# Patient Record
Sex: Female | Born: 1955 | Race: Black or African American | Hispanic: No | Marital: Married | Smoking: Never smoker
Health system: Southern US, Community
[De-identification: ages and names within clinical notes are randomized; demographics above are authoritative.]

## PROBLEM LIST (undated history)

## (undated) DIAGNOSIS — K219 Gastro-esophageal reflux disease without esophagitis: Secondary | ICD-10-CM

## (undated) DIAGNOSIS — G4730 Sleep apnea, unspecified: Secondary | ICD-10-CM

## (undated) DIAGNOSIS — I10 Essential (primary) hypertension: Secondary | ICD-10-CM

## (undated) DIAGNOSIS — E669 Obesity, unspecified: Secondary | ICD-10-CM

## (undated) DIAGNOSIS — R809 Proteinuria, unspecified: Secondary | ICD-10-CM

## (undated) DIAGNOSIS — R7303 Prediabetes: Secondary | ICD-10-CM

## (undated) HISTORY — PX: CHOLECYSTECTOMY: SHX55

## (undated) HISTORY — PX: TONSILLECTOMY: SUR1361

## (undated) HISTORY — PX: BREAST EXCISIONAL BIOPSY: SUR124

## (undated) HISTORY — PX: BREAST SURGERY: SHX581

---

## 1998-01-04 HISTORY — PX: BREAST BIOPSY: SHX20

## 2006-02-08 ENCOUNTER — Ambulatory Visit: Payer: Self-pay | Admitting: Internal Medicine

## 2006-03-02 ENCOUNTER — Ambulatory Visit: Payer: Self-pay | Admitting: Internal Medicine

## 2006-03-08 ENCOUNTER — Ambulatory Visit: Payer: Self-pay | Admitting: Internal Medicine

## 2006-04-07 ENCOUNTER — Ambulatory Visit: Payer: Self-pay | Admitting: Surgery

## 2006-11-24 ENCOUNTER — Ambulatory Visit: Payer: Self-pay | Admitting: Internal Medicine

## 2007-04-11 ENCOUNTER — Ambulatory Visit: Payer: Self-pay | Admitting: Internal Medicine

## 2007-08-29 ENCOUNTER — Ambulatory Visit (HOSPITAL_COMMUNITY): Admission: RE | Admit: 2007-08-29 | Discharge: 2007-08-29 | Payer: Self-pay | Admitting: Neurology

## 2007-08-29 ENCOUNTER — Ambulatory Visit: Payer: Self-pay | Admitting: Surgery

## 2007-08-29 ENCOUNTER — Encounter (INDEPENDENT_AMBULATORY_CARE_PROVIDER_SITE_OTHER): Payer: Self-pay | Admitting: Neurology

## 2007-10-12 ENCOUNTER — Ambulatory Visit: Payer: Self-pay | Admitting: Internal Medicine

## 2007-10-13 ENCOUNTER — Ambulatory Visit: Payer: Self-pay | Admitting: Internal Medicine

## 2008-01-19 ENCOUNTER — Ambulatory Visit: Payer: Self-pay | Admitting: Gastroenterology

## 2008-06-05 ENCOUNTER — Ambulatory Visit: Payer: Self-pay | Admitting: Internal Medicine

## 2009-06-25 ENCOUNTER — Ambulatory Visit: Payer: Self-pay | Admitting: Internal Medicine

## 2010-07-20 ENCOUNTER — Ambulatory Visit: Payer: Self-pay

## 2011-07-29 ENCOUNTER — Ambulatory Visit: Payer: Self-pay

## 2012-07-31 ENCOUNTER — Ambulatory Visit: Payer: Self-pay | Admitting: Internal Medicine

## 2013-07-23 ENCOUNTER — Ambulatory Visit: Payer: Self-pay

## 2013-08-20 ENCOUNTER — Ambulatory Visit: Payer: Self-pay

## 2014-12-17 DIAGNOSIS — E669 Obesity, unspecified: Secondary | ICD-10-CM | POA: Insufficient documentation

## 2014-12-17 DIAGNOSIS — I10 Essential (primary) hypertension: Secondary | ICD-10-CM | POA: Insufficient documentation

## 2014-12-17 DIAGNOSIS — R809 Proteinuria, unspecified: Secondary | ICD-10-CM | POA: Insufficient documentation

## 2014-12-17 DIAGNOSIS — G4733 Obstructive sleep apnea (adult) (pediatric): Secondary | ICD-10-CM | POA: Insufficient documentation

## 2015-01-30 ENCOUNTER — Encounter: Payer: Self-pay | Admitting: *Deleted

## 2015-01-31 ENCOUNTER — Ambulatory Visit: Payer: 59 | Admitting: Certified Registered Nurse Anesthetist

## 2015-01-31 ENCOUNTER — Ambulatory Visit
Admission: RE | Admit: 2015-01-31 | Discharge: 2015-01-31 | Disposition: A | Discharge status: Home / Self Care | Payer: 59 | Source: Ambulatory Visit | Attending: Gastroenterology | Admitting: Gastroenterology

## 2015-01-31 ENCOUNTER — Encounter
Admission: RE | Disposition: A | Discharge status: Home / Self Care | Payer: Self-pay | Source: Ambulatory Visit | Attending: Gastroenterology

## 2015-01-31 ENCOUNTER — Encounter: Payer: Self-pay | Admitting: *Deleted

## 2015-01-31 DIAGNOSIS — D122 Benign neoplasm of ascending colon: Secondary | ICD-10-CM | POA: Insufficient documentation

## 2015-01-31 DIAGNOSIS — Z880 Allergy status to penicillin: Secondary | ICD-10-CM | POA: Diagnosis not present

## 2015-01-31 DIAGNOSIS — I10 Essential (primary) hypertension: Secondary | ICD-10-CM | POA: Insufficient documentation

## 2015-01-31 DIAGNOSIS — Z7982 Long term (current) use of aspirin: Secondary | ICD-10-CM | POA: Insufficient documentation

## 2015-01-31 DIAGNOSIS — E669 Obesity, unspecified: Secondary | ICD-10-CM | POA: Diagnosis not present

## 2015-01-31 DIAGNOSIS — K640 First degree hemorrhoids: Secondary | ICD-10-CM | POA: Insufficient documentation

## 2015-01-31 DIAGNOSIS — K5730 Diverticulosis of large intestine without perforation or abscess without bleeding: Secondary | ICD-10-CM | POA: Diagnosis not present

## 2015-01-31 DIAGNOSIS — G4730 Sleep apnea, unspecified: Secondary | ICD-10-CM | POA: Diagnosis not present

## 2015-01-31 DIAGNOSIS — Z8371 Family history of colonic polyps: Secondary | ICD-10-CM | POA: Diagnosis not present

## 2015-01-31 DIAGNOSIS — Z79899 Other long term (current) drug therapy: Secondary | ICD-10-CM | POA: Diagnosis not present

## 2015-01-31 DIAGNOSIS — Z6837 Body mass index (BMI) 37.0-37.9, adult: Secondary | ICD-10-CM | POA: Diagnosis not present

## 2015-01-31 DIAGNOSIS — K625 Hemorrhage of anus and rectum: Secondary | ICD-10-CM | POA: Diagnosis not present

## 2015-01-31 HISTORY — PX: COLONOSCOPY WITH PROPOFOL: SHX5780

## 2015-01-31 HISTORY — DX: Obesity, unspecified: E66.9

## 2015-01-31 HISTORY — DX: Essential (primary) hypertension: I10

## 2015-01-31 HISTORY — DX: Sleep apnea, unspecified: G47.30

## 2015-01-31 HISTORY — DX: Proteinuria, unspecified: R80.9

## 2015-01-31 SURGERY — COLONOSCOPY WITH PROPOFOL
Anesthesia: General

## 2015-01-31 MED ORDER — PROPOFOL 500 MG/50ML IV EMUL
INTRAVENOUS | Status: DC | PRN
  Administered 2015-01-31: 140 ug/kg/min via INTRAVENOUS

## 2015-01-31 MED ORDER — PROPOFOL 10 MG/ML IV BOLUS
INTRAVENOUS | Status: DC | PRN
  Administered 2015-01-31: 20 mg via INTRAVENOUS

## 2015-01-31 MED ORDER — SODIUM CHLORIDE 0.9 % IV SOLN
INTRAVENOUS | Status: DC
  Administered 2015-01-31: 1000 mL via INTRAVENOUS

## 2015-01-31 MED ORDER — MIDAZOLAM HCL 2 MG/2ML IJ SOLN
INTRAMUSCULAR | Status: DC | PRN
  Administered 2015-01-31: 1 mg via INTRAVENOUS

## 2015-01-31 MED ORDER — LIDOCAINE HCL (CARDIAC) 20 MG/ML IV SOLN
INTRAVENOUS | Status: DC | PRN
Start: 2015-01-31 — End: 2015-01-31
  Administered 2015-01-31: 60 mg via INTRAVENOUS

## 2015-01-31 MED ORDER — SODIUM CHLORIDE 0.9 % IV SOLN: INTRAVENOUS | Status: DC

## 2015-01-31 NOTE — Anesthesia Procedure Notes (Signed)
Date/Time: 01/31/2015 1:22 PM Performed by: Johnna Acosta Pre-anesthesia Checklist: Patient identified, Suction available, Emergency Drugs available, Patient being monitored and Timeout performed Patient Re-evaluated:Patient Re-evaluated prior to inductionOxygen Delivery Method: Nasal cannula

## 2015-01-31 NOTE — H&P (Signed)
Outpatient short stay form Pre-procedure 01/31/2015 12:59 PM Lollie Sails MD  Primary Physician: Dr Clayborn Bigness  Reason for visit:  Colonoscopy  History of present illness:  Patient is a 60 year old female presenting today for colonoscopy. She is been having some problems with occasional rectal bleeding over the last 6 weeks or so. He does have a family history of colon polyps her primary relative. Last colonoscopy was in 2010 with a finding of diverticulosis and a hyperplastic polyp. She tolerated her prep well. She has held her 81 mg aspirin for about 5 days. She takes no other aspirin or blood thinning products.    Current facility-administered medications:  .  0.9 %  sodium chloride infusion, , Intravenous, Continuous, Lollie Sails, MD .  0.9 %  sodium chloride infusion, , Intravenous, Continuous, Lollie Sails, MD  Prescriptions prior to admission  Medication Sig Dispense Refill Last Dose  . aspirin EC 81 MG tablet Take 81 mg by mouth daily.     . hydrocortisone 2.5 % cream Apply 1 application topically as needed.     Marland Kitchen losartan (COZAAR) 50 MG tablet Take 50 mg by mouth daily.   01/30/2015 at 2130     Allergies  Allergen Reactions  . Penicillins      Past Medical History  Diagnosis Date  . Hypertension   . Sleep apnea   . Obesity   . Protein in urine     Review of systems:      Physical Exam    Heart and lungs: Regular rate and rhythm without rub or gallop, lungs are bilaterally clear.    HEENT: Normocephalic atraumatic eyes are anicteric    Other:     Pertinant exam for procedure: Soft nontender nondistended bowel sounds positive normoactive.    Planned proceedures: Colonoscopy and indicated procedures. I have discussed the risks benefits and complications of procedures to include not limited to bleeding, infection, perforation and the risk of sedation and the patient wishes to proceed.    Lollie Sails, MD Gastroenterology 01/31/2015   12:59 PM

## 2015-01-31 NOTE — Op Note (Signed)
[facility] Gastroenterology Patient Name: Jamie Powers Procedure Date: 01/31/2015 1:21 PM MRN: [id number] Account #: [id number] Date of Birth: March 01, 1955 Admit Type: Outpatient Age: 60 Room: [facility] ENDO ROOM 3 Gender: Female Note Status: Finalized Procedure:         Colonoscopy Indications:       Rectal bleeding, Family history of colonic polyps in a                     first-degree relative Providers:         Lollie Sails, MD Referring MD:      Lavera Guise, MD (Referring MD) Medicines:         Monitored Anesthesia Care Complications:     No immediate complications. Procedure:         Pre-Anesthesia Assessment:                    - ASA Grade Assessment: III - A patient with severe                     systemic disease.                    After obtaining informed consent, the colonoscope was                     passed under direct vision. Throughout the procedure, the                     patient's blood pressure, pulse, and oxygen saturations                     were monitored continuously. The Colonoscope was                     introduced through the anus and advanced to the the cecum,                     identified by appendiceal orifice and ileocecal valve. The                     colonoscopy was performed with moderate difficulty.                     Successful completion of the procedure was aided by                     changing the patient to a supine position. The patient                     tolerated the procedure well. The quality of the bowel                     preparation was fair. Findings:      Multiple small and large-mouthed diverticula were found in the entire       colon.      A 11 mm polyp was found in the ascending colon in the proximal ascending       colon. The polyp was semi-pedunculated. The polyp was removed with a hot       snare. Resection and retrieval were complete.      Two sessile polyps were found in the mid ascending  colon. The polyps       were 3 to 4 mm in size. These polyps were  removed with a cold biopsy       forceps. Resection and retrieval were complete.      Non-bleeding internal hemorrhoids were found during retroflexion. The       hemorrhoids were small and Grade I (internal hemorrhoids that do not       prolapse).      The digital rectal exam was normal. Impression:        - Diverticulosis in the entire examined colon.                    - One 11 mm polyp in the ascending colon in the proximal                     ascending colon. Resected and retrieved.                    - Two 3 to 4 mm polyps in the mid ascending colon.                     Resected and retrieved.                    - Non-bleeding internal hemorrhoids. Recommendation:    - Await pathology results.                    - Telephone GI clinic for pathology results in 1 week. Procedure Code(s): --- Professional ---                    [phone], Colonoscopy, flexible; with removal of tumor(s),                     polyp(s), or other lesion(s) by snare technique                    45380, 37, Colonoscopy, flexible; with biopsy, single or                     multiple Diagnosis Code(s): --- Professional ---                    K64.0, First degree hemorrhoids                    D12.2, Benign neoplasm of ascending colon                    K62.5, Hemorrhage of anus and rectum                    Z83.71, Family history of colonic polyps                    K57.30, Diverticulosis of large intestine without                     perforation or abscess without bleeding CPT copyright 2014 American Medical Association. All rights reserved. The codes documented in this report are preliminary and upon coder review may  be revised to meet current compliance requirements. Lollie Sails, MD 01/31/2015 2:14:17 PM This report has been signed electronically. Number of Addenda: 0 Note Initiated On: 01/31/2015 1:21 PM Scope Withdrawal Time: 0 hours 30  minutes 48 seconds  Total Procedure Duration: 0 hours 43 minutes 12 seconds       [facility]

## 2015-01-31 NOTE — Transfer of Care (Signed)
Immediate Anesthesia Transfer of Care Note  Patient: Jamie Powers  Procedure(s) Performed: Procedure(s): COLONOSCOPY WITH PROPOFOL (N/A)  Patient Location: PACU  Anesthesia Type:General  Level of Consciousness: sedated  Airway & Oxygen Therapy: Patient Spontanous Breathing and Patient connected to nasal cannula oxygen  Post-op Assessment: Report given to RN and Post -op Vital signs reviewed and stable  Post vital signs: Reviewed and stable  Last Vitals:  Filed Vitals:   01/31/15 1254  BP: 133/73  Pulse: 56  Temp: 36.4 C  Resp: 18    Complications: No apparent anesthesia complications

## 2015-01-31 NOTE — Anesthesia Postprocedure Evaluation (Signed)
Anesthesia Post Note  Patient: Jamie Powers  Procedure(s) Performed: Procedure(s) (LRB): COLONOSCOPY WITH PROPOFOL (N/A)  Patient location during evaluation: Endoscopy Anesthesia Type: General Level of consciousness: awake and alert Pain management: pain level controlled Vital Signs Assessment: post-procedure vital signs reviewed and stable Respiratory status: spontaneous breathing, nonlabored ventilation, respiratory function stable and patient connected to nasal cannula oxygen Cardiovascular status: blood pressure returned to baseline and stable Postop Assessment: no signs of nausea or vomiting Anesthetic complications: no    Last Vitals:  Filed Vitals:   01/31/15 1440 01/31/15 1450  BP: 122/69 112/62  Pulse: 57 49  Temp:    Resp: 17 17    Last Pain: There were no vitals filed for this visit.               Precious Haws Lakedra [place]

## 2015-01-31 NOTE — Anesthesia Preprocedure Evaluation (Signed)
Anesthesia Evaluation  Patient identified by MRN, date of birth, ID band Patient awake    Reviewed: Allergy & Precautions, H&P , NPO status , Patient's Chart, lab work & pertinent test results  History of Anesthesia Complications Negative for: history of anesthetic complications  Airway Mallampati: III  TM Distance: >3 FB Neck ROM: limited    Dental  (+) Poor Dentition   Pulmonary neg shortness of breath, sleep apnea and Continuous Positive Airway Pressure Ventilation ,    Pulmonary exam normal breath sounds clear to auscultation       Cardiovascular Exercise Tolerance: Good hypertension, (-) angina(-) Past MI and (-) DOE Normal cardiovascular exam Rhythm:regular Rate:Normal     Neuro/Psych negative neurological ROS  negative psych ROS   GI/Hepatic negative GI ROS, Neg liver ROS,   Endo/Other  negative endocrine ROS  Renal/GU Renal disease  negative genitourinary   Musculoskeletal   Abdominal   Peds  Hematology negative hematology ROS (+)   Anesthesia Other Findings Past Medical History:   Hypertension                                                 Sleep apnea                                                  Obesity                                                      Protein in urine                                            Past Surgical History:   TONSILLECTOMY                                                 CHOLECYSTECTOMY                                               BREAST SURGERY                                               BMI    Body Mass Index   37.74 kg/m 2      Reproductive/Obstetrics negative OB ROS                             Anesthesia Physical Anesthesia Plan  ASA: III  Anesthesia Plan: General   Post-op Pain Management:    Induction:   Airway Management Planned:  Additional Equipment:   Intra-op Plan:   Post-operative Plan:   Informed  Consent: I have reviewed the patients History and Physical, chart, labs and discussed the procedure including the risks, benefits and alternatives for the proposed anesthesia with the patient or authorized representative who has indicated his/her understanding and acceptance.   Dental Advisory Given  Plan Discussed with: Anesthesiologist, CRNA and Surgeon  Anesthesia Plan Comments:         Anesthesia Quick Evaluation

## 2015-02-01 ENCOUNTER — Encounter: Payer: Self-pay | Admitting: Gastroenterology

## 2015-02-04 LAB — SURGICAL PATHOLOGY

## 2015-07-30 ENCOUNTER — Other Ambulatory Visit: Payer: Self-pay | Admitting: Physician Assistant

## 2015-07-30 DIAGNOSIS — Z1231 Encounter for screening mammogram for malignant neoplasm of breast: Secondary | ICD-10-CM

## 2015-08-07 ENCOUNTER — Ambulatory Visit
Admission: RE | Admit: 2015-08-07 | Discharge: 2015-08-07 | Disposition: A | Discharge status: Home / Self Care | Payer: 59 | Source: Ambulatory Visit | Attending: Physician Assistant | Admitting: Physician Assistant

## 2015-08-07 ENCOUNTER — Other Ambulatory Visit: Payer: Self-pay | Admitting: Physician Assistant

## 2015-08-07 DIAGNOSIS — Z1231 Encounter for screening mammogram for malignant neoplasm of breast: Secondary | ICD-10-CM | POA: Diagnosis present

## 2015-08-25 ENCOUNTER — Ambulatory Visit: Payer: 59

## 2015-12-02 ENCOUNTER — Other Ambulatory Visit: Payer: Self-pay | Admitting: Nurse Practitioner

## 2015-12-02 DIAGNOSIS — M545 Low back pain: Secondary | ICD-10-CM

## 2015-12-04 ENCOUNTER — Other Ambulatory Visit: Payer: Self-pay | Admitting: Nurse Practitioner

## 2015-12-04 DIAGNOSIS — N63 Unspecified lump in unspecified breast: Secondary | ICD-10-CM

## 2015-12-12 ENCOUNTER — Ambulatory Visit
Admission: RE | Admit: 2015-12-12 | Discharge: 2015-12-12 | Disposition: A | Discharge status: Home / Self Care | Payer: 59 | Source: Ambulatory Visit | Attending: Nurse Practitioner | Admitting: Nurse Practitioner

## 2015-12-12 ENCOUNTER — Other Ambulatory Visit: Payer: Self-pay | Admitting: Nurse Practitioner

## 2015-12-12 DIAGNOSIS — M25561 Pain in right knee: Secondary | ICD-10-CM | POA: Insufficient documentation

## 2015-12-12 DIAGNOSIS — M545 Low back pain: Secondary | ICD-10-CM | POA: Diagnosis not present

## 2015-12-12 DIAGNOSIS — M5136 Other intervertebral disc degeneration, lumbar region: Secondary | ICD-10-CM | POA: Diagnosis not present

## 2015-12-23 ENCOUNTER — Ambulatory Visit
Admission: RE | Admit: 2015-12-23 | Discharge: 2015-12-23 | Disposition: A | Discharge status: Home / Self Care | Payer: 59 | Source: Ambulatory Visit | Attending: Nurse Practitioner | Admitting: Nurse Practitioner

## 2015-12-23 DIAGNOSIS — N63 Unspecified lump in unspecified breast: Secondary | ICD-10-CM

## 2015-12-23 DIAGNOSIS — N6310 Unspecified lump in the right breast, unspecified quadrant: Secondary | ICD-10-CM | POA: Insufficient documentation

## 2015-12-23 DIAGNOSIS — N644 Mastodynia: Secondary | ICD-10-CM | POA: Diagnosis not present

## 2016-10-13 ENCOUNTER — Other Ambulatory Visit: Payer: Self-pay | Admitting: Family Medicine

## 2016-10-13 ENCOUNTER — Other Ambulatory Visit: Payer: Self-pay | Admitting: Nurse Practitioner

## 2016-10-13 DIAGNOSIS — Z1231 Encounter for screening mammogram for malignant neoplasm of breast: Secondary | ICD-10-CM

## 2016-11-04 ENCOUNTER — Ambulatory Visit
Admission: RE | Admit: 2016-11-04 | Discharge: 2016-11-04 | Disposition: A | Discharge status: Home / Self Care | Payer: PRIVATE HEALTH INSURANCE | Source: Ambulatory Visit | Attending: Family Medicine | Admitting: Family Medicine

## 2016-11-04 DIAGNOSIS — Z1231 Encounter for screening mammogram for malignant neoplasm of breast: Secondary | ICD-10-CM | POA: Diagnosis present

## 2017-01-05 DIAGNOSIS — E782 Mixed hyperlipidemia: Secondary | ICD-10-CM | POA: Insufficient documentation

## 2017-01-06 ENCOUNTER — Telehealth: Payer: Self-pay

## 2017-01-06 ENCOUNTER — Ambulatory Visit: Payer: BLUE CROSS/BLUE SHIELD | Admitting: Nurse Practitioner

## 2017-01-06 ENCOUNTER — Encounter: Payer: Self-pay | Admitting: Nurse Practitioner

## 2017-01-06 VITALS — BP 126/87 | HR 68 | Resp 16 | Ht 59.0 in | Wt 206.4 lb

## 2017-01-06 DIAGNOSIS — H1132 Conjunctival hemorrhage, left eye: Secondary | ICD-10-CM

## 2017-01-06 DIAGNOSIS — I10 Essential (primary) hypertension: Secondary | ICD-10-CM | POA: Diagnosis not present

## 2017-01-06 DIAGNOSIS — L84 Corns and callosities: Secondary | ICD-10-CM

## 2017-01-06 DIAGNOSIS — H109 Unspecified conjunctivitis: Secondary | ICD-10-CM | POA: Diagnosis not present

## 2017-01-06 DIAGNOSIS — M25552 Pain in left hip: Secondary | ICD-10-CM

## 2017-01-06 MED ORDER — CIPROFLOXACIN HCL 0.3 % OP SOLN: 1.0000 [drp] | OPHTHALMIC | 0 refills | Status: DC

## 2017-01-06 NOTE — Progress Notes (Signed)
Patient ID: Jamie Powers, female   DOB: October 01, 1955, 62 y.o.   MRN: [id number]   [facility] [place], [place] [address]  Internal MEDICINE  Office Visit Note  Patient Name: Jamie Powers  [id number]  [id number]  Date of Service: 01/06/2017     Complaints/HPI:  Pt is here for a sick visit.  The patient c/o left eye redness and irritation. Started on Sunday while sleeping. Pain in her left eye woke her up. Has been slightly itchy wth some watering since then. Has some tenderness in left upper eyelid. Hsa not noted any pu or colored drainage. Eyelids not matted together in the mornings. She denies fever, nasal congestion, or headache. Has not been around anyone with pink eye or other eye infections.  The patient is also c/o callus on the outer aspect of her left foot. Painful when she walks. Very hard. Not going away with OTC treatments for callus.  Also continues to have lower back pain which radiates into the left hip. Hip pain can be so severe at times, it hurts to walk. She will, sometimes take tylenol arthritis, which does help some.      Current Medication:  Outpatient Encounter Medications as of 01/06/2017  Medication Sig  . aspirin EC 81 MG tablet Take 81 mg by mouth daily.  . hydrocortisone 2.5 % cream Apply 1 application topically as needed.  Marland Kitchen losartan (COZAAR) 50 MG tablet Take 50 mg by mouth daily.   No facility-administered encounter medications on file as of 01/06/2017.       Medical History: Past Medical History:  Diagnosis Date  . Hypertension   . Obesity   . Protein in urine   . Sleep apnea      Social History   Socioeconomic History  . Marital status: Married    Spouse name: Not on file  . Number of children: Not on file  . Years of education: Not on file  . Highest education level: Not on file  Social Needs  . Financial resource strain: Not on file  . Food insecurity - worry: Not on file  . Food insecurity - inability:  Not on file  . Transportation needs - medical: Not on file  . Transportation needs - non-medical: Not on file  Occupational History  . Not on file  Tobacco Use  . Smoking status: Never Smoker  . Smokeless tobacco: Never Used  Substance and Sexual Activity  . Alcohol use: No  . Drug use: No  . Sexual activity: Not on file  Other Topics Concern  . Not on file  Social History Narrative  . Not on file     Today's Vitals   01/06/17 0856  BP: 126/87  Pulse: 68  Resp: 16  SpO2: 97%  Weight: 206 lb 6.4 oz (93.6 kg)  Height: 4\' 11"  (1.499 m)    Review of Systems  Constitutional: Negative for chills, fever and malaise/fatigue.  HENT: Negative for congestion, sinus pain and sore throat.   Eyes: Positive for pain and redness. Negative for blurred vision.  Respiratory: Negative for cough, shortness of breath and wheezing.   Cardiovascular: Negative for chest pain, palpitations, orthopnea and leg swelling.  Gastrointestinal: Negative for constipation, diarrhea, nausea and vomiting.  Musculoskeletal: Positive for back pain and joint pain.       C/o lower back pain, now radiating into the left hip.   Skin: Negative for rash.       Callus formation on outer left  foot, just below the pinky toe  Neurological: Negative for headaches.  Endo/Heme/Allergies: Negative for environmental allergies and polydipsia.  Psychiatric/Behavioral: Negative for depression. The patient is not nervous/anxious.      Physical Exam  Constitutional: She is oriented to person, place, and time and well-developed, well-nourished, and in no distress.  HENT:  Head: Normocephalic and atraumatic.  Eyes: EOM are normal. Pupils are equal, round, and reactive to light. Left eye exhibits exudate. Left conjunctiva has a hemorrhage.    Neck: Normal range of motion. Neck supple.  Cardiovascular: Normal rate and regular rhythm.  Pulmonary/Chest: Effort normal and breath sounds normal. She has no wheezes.  Abdominal:  Soft. There is no tenderness.  Musculoskeletal:       Legs: Neurological: She is alert and oriented to person, place, and time.  Skin: Skin is warm, dry and intact.     Psychiatric: Affect normal.  Nursing note and vitals reviewed.  Assessment and Plan   ICD-10-CM   1. Bacterial conjunctivitis of left eye H10.9 ciprofloxacin (CILOXAN) 0.3 % ophthalmic solution  2. Subconjunctival bleed, left H11.32   3. Callus of foot L84 Consult to podiatry  4. Pain in joint of left hip M25.552 DG HIP UNILAT WITH PELVIS 2-3 VIEWS LEFT  5. Hypertension, unspecified type I10    1. Ciprofloxacin eye drops every 2 hours while awake for next 7 days. Concern for ulceration of cornea. Will have her see ophthalmology if no improvement or worsening by Monday 2. Subconjunctival hemorrhage - educated patient regarding condition. Will have her see opthalmology if no improvement by Monday..  3. Refer to podiatry for further treatment of left foot callus 4. Will x-ray left hip prior to CPE 01/27/2017 and discuss at that visit.   This patient was seen by Leretha Pol, FNP- C in Collaboration with Dr Lavera Guise as a part of collaborative care agreement  She should return for her CPE as scheduled.

## 2017-01-06 NOTE — Telephone Encounter (Signed)
errr

## 2017-01-06 NOTE — Patient Instructions (Addendum)

## 2017-01-13 ENCOUNTER — Encounter: Payer: Self-pay | Admitting: Nurse Practitioner

## 2017-01-13 NOTE — Progress Notes (Signed)
Patient scheduled to see [facility] 01/14/17 @ 3:00.Tat

## 2017-01-14 ENCOUNTER — Ambulatory Visit
Admission: RE | Admit: 2017-01-14 | Discharge: 2017-01-14 | Disposition: A | Discharge status: Home / Self Care | Payer: BLUE CROSS/BLUE SHIELD | Source: Ambulatory Visit | Attending: Nurse Practitioner | Admitting: Nurse Practitioner

## 2017-01-14 DIAGNOSIS — M25552 Pain in left hip: Secondary | ICD-10-CM

## 2017-01-26 NOTE — Progress Notes (Signed)
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

## 2017-01-28 ENCOUNTER — Encounter: Payer: Self-pay | Admitting: Nurse Practitioner

## 2017-02-02 ENCOUNTER — Encounter: Payer: Self-pay | Admitting: Nurse Practitioner

## 2017-02-02 ENCOUNTER — Ambulatory Visit: Payer: Self-pay | Admitting: Podiatry

## 2017-02-02 NOTE — Progress Notes (Unsigned)
Patient scheduled to see Dr Candelaria Stagers on 02/07/17 @ 9:30 [facility]

## 2017-02-04 ENCOUNTER — Encounter: Payer: Self-pay | Admitting: Podiatry

## 2017-03-01 ENCOUNTER — Ambulatory Visit (INDEPENDENT_AMBULATORY_CARE_PROVIDER_SITE_OTHER): Payer: BLUE CROSS/BLUE SHIELD

## 2017-03-01 ENCOUNTER — Ambulatory Visit (INDEPENDENT_AMBULATORY_CARE_PROVIDER_SITE_OTHER): Payer: BLUE CROSS/BLUE SHIELD | Admitting: Podiatry

## 2017-03-01 ENCOUNTER — Encounter: Payer: Self-pay | Admitting: Podiatry

## 2017-03-01 DIAGNOSIS — M7752 Other enthesopathy of left foot: Secondary | ICD-10-CM | POA: Diagnosis not present

## 2017-03-01 DIAGNOSIS — M779 Enthesopathy, unspecified: Secondary | ICD-10-CM

## 2017-03-01 DIAGNOSIS — L989 Disorder of the skin and subcutaneous tissue, unspecified: Secondary | ICD-10-CM | POA: Diagnosis not present

## 2017-03-01 NOTE — Progress Notes (Signed)
   Subjective:    Patient ID: Jamie Powers, female    DOB: [birth year], 62 y.o.   MRN: [id number]  HPI    Review of Systems  All other systems reviewed and are negative.      Objective:   Physical Exam        Assessment & Plan:

## 2017-03-03 NOTE — Progress Notes (Signed)
   Subjective: 62 year old female presents to the office today as a new patient for a chief complaint of a painful callus lesion located at the sub-5th MPJ of the left foot. She states it feels as if she is walking on a rock. She reports associated intermittent sharp, aching pain to the left foot. Walking and wearing certain shoes increases the pain. She has been filing the callus lesion down herself. Patient presents today for further treatment and evaluation.   Past Medical History:  Diagnosis Date  . Hypertension   . Obesity   . Protein in urine   . Sleep apnea     Objective:  Physical Exam General: Alert and oriented x3 in no acute distress  Dermatology: Hyperkeratotic lesion present on the sub-5th MPJ of the left foot. Pain on palpation with a central nucleated core noted.  Skin is warm, dry and supple bilateral lower extremities. Negative for open lesions or macerations.  Vascular: Palpable pedal pulses bilaterally. No edema or erythema noted. Capillary refill within normal limits.  Neurological: Epicritic and protective threshold grossly intact bilaterally.   Musculoskeletal Exam: Pain on palpation at the keratotic lesion noted. Range of motion within normal limits bilateral. Muscle strength 5/5 in all groups bilateral.  Radiographic Exam:  Normal osseous mineralization. Joint spaces preserved. No fracture/dislocation/boney destruction.     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 X-Rays reviewed.  #2 Excisional debridement of keratoic lesion using a chisel blade was performed without incident.  #3 Treated area(s) with Salinocaine and dressed with light dressing. #4 Injection of 0.5 mLs Celestone Soluspan injected into the 5th MPJ of the left foot.  #5 Return to clinic as needed.    Edrick Kins, DPM [facility]  Dr. Edrick Kins, [place]                                         [place], [place] [address]                Office [phone]  Fax [phone]

## 2017-03-14 NOTE — Progress Notes (Signed)
This encounter was created in error - please disregard.

## 2017-03-25 ENCOUNTER — Encounter: Payer: Self-pay | Admitting: Nurse Practitioner

## 2017-04-06 ENCOUNTER — Ambulatory Visit: Payer: BLUE CROSS/BLUE SHIELD | Admitting: Internal Medicine

## 2017-04-06 ENCOUNTER — Encounter: Payer: Self-pay | Admitting: Internal Medicine

## 2017-04-06 VITALS — BP 124/77 | HR 88 | Resp 16 | Ht 60.0 in | Wt 209.8 lb

## 2017-04-06 DIAGNOSIS — Z780 Asymptomatic menopausal state: Secondary | ICD-10-CM | POA: Diagnosis not present

## 2017-04-06 DIAGNOSIS — I10 Essential (primary) hypertension: Secondary | ICD-10-CM | POA: Diagnosis not present

## 2017-04-06 DIAGNOSIS — K219 Gastro-esophageal reflux disease without esophagitis: Secondary | ICD-10-CM | POA: Diagnosis not present

## 2017-04-06 DIAGNOSIS — Z0001 Encounter for general adult medical examination with abnormal findings: Secondary | ICD-10-CM | POA: Diagnosis not present

## 2017-04-06 DIAGNOSIS — R300 Dysuria: Secondary | ICD-10-CM | POA: Diagnosis not present

## 2017-04-06 MED ORDER — OMEPRAZOLE 40 MG PO CPDR: 40.0000 mg | DELAYED_RELEASE_CAPSULE | Freq: Every day | ORAL | 3 refills | Status: DC

## 2017-04-06 MED ORDER — SULFAMETHOXAZOLE-TRIMETHOPRIM 400-80 MG PO TABS: 1.0000 | ORAL_TABLET | Freq: Two times a day (BID) | ORAL | 0 refills | Status: DC

## 2017-04-06 MED ORDER — LOSARTAN POTASSIUM 50 MG PO TABS: 50.0000 mg | ORAL_TABLET | Freq: Every day | ORAL | 3 refills | Status: DC

## 2017-04-06 MED ORDER — PREDNISONE 10 MG PO TABS: ORAL_TABLET | ORAL | 0 refills | Status: DC

## 2017-04-06 MED ORDER — BUPROPION HCL ER (XL) 150 MG PO TB24
150.0000 mg | ORAL_TABLET | Freq: Every day | ORAL | 3 refills | Status: DC
Start: 2017-04-06 — End: 2017-04-06

## 2017-04-06 MED ORDER — BUPROPION HCL ER (XL) 150 MG PO TB24: 150.0000 mg | ORAL_TABLET | Freq: Every day | ORAL | 3 refills | Status: DC

## 2017-04-06 MED ORDER — HYDROCOD POLST-CPM POLST ER 10-8 MG/5ML PO SUER: 5.0000 mL | Freq: Two times a day (BID) | ORAL | 0 refills | Status: DC

## 2017-04-06 NOTE — Progress Notes (Signed)
[facility] [place], [place] [address]  Internal MEDICINE  Office Visit Note  Patient Name: Jamie Powers  [id number]  [id number]  Date of Service: 04/29/2017  Chief Complaint  Patient presents with  . Annual Exam     HPI Pt is here for routine health maintenance examination  Current Medication: Outpatient Encounter Medications as of 04/06/2017  Medication Sig  . aspirin EC 81 MG tablet Take 81 mg by mouth daily.  Marland Kitchen losartan (COZAAR) 50 MG tablet Take 1 tablet (50 mg total) by mouth daily.  . [DISCONTINUED] losartan (COZAAR) 50 MG tablet Take by mouth.  . AFLURIA QUADRIVALENT 0.5 ML injection TO BE ADMINISTERED BY PHARMACIST FOR IMMUNIZATION  . buPROPion (WELLBUTRIN XL) 150 MG 24 hr tablet Take 1 tablet (150 mg total) by mouth daily.  . hydrocortisone 2.5 % cream Apply 1 application topically as needed.  Marland Kitchen omeprazole (PRILOSEC) 40 MG capsule Take 1 capsule (40 mg total) by mouth daily.  . [DISCONTINUED] buPROPion (WELLBUTRIN XL) 150 MG 24 hr tablet Take 1 tablet (150 mg total) by mouth daily.  . [DISCONTINUED] chlorpheniramine-HYDROcodone (TUSSIONEX PENNKINETIC ER) 10-8 MG/5ML SUER Take 5 mLs by mouth 2 (two) times daily.  . [DISCONTINUED] chlorpheniramine-HYDROcodone (TUSSIONEX PENNKINETIC ER) 10-8 MG/5ML SUER Take 5 mLs by mouth 2 (two) times daily.  . [DISCONTINUED] predniSONE (DELTASONE) 10 MG tablet Take one tab po bid for 4 days and then one a day until finished  . [DISCONTINUED] predniSONE (DELTASONE) 10 MG tablet Take one tab po bid for 4 days and then one a day until finished  . [DISCONTINUED] sulfamethoxazole-trimethoprim (BACTRIM,SEPTRA) 400-80 MG tablet Take 1 tablet by mouth 2 (two) times daily for 10 days.  . [DISCONTINUED] sulfamethoxazole-trimethoprim (BACTRIM,SEPTRA) 400-80 MG tablet Take 1 tablet by mouth 2 (two) times daily for 10 days.   No facility-administered encounter medications on file as of 04/06/2017.     Surgical  History: Past Surgical History:  Procedure Laterality Date  . BREAST BIOPSY Left 2000  . BREAST SURGERY    . CHOLECYSTECTOMY    . COLONOSCOPY WITH PROPOFOL N/A 01/31/2015   Procedure: COLONOSCOPY WITH PROPOFOL;  Surgeon: Lollie Sails, MD;  Location: [facility] ENDOSCOPY;  Service: Endoscopy;  Laterality: N/A;  . TONSILLECTOMY      Medical History: Past Medical History:  Diagnosis Date  . Hypertension   . Obesity   . Protein in urine   . Sleep apnea     Family History: Family History  Problem Relation Age of Onset  . Breast cancer Paternal Aunt    Review of Systems  Constitutional: Negative for chills, diaphoresis and fatigue.  HENT: Negative for ear pain, postnasal drip and sinus pressure.   Eyes: Negative for photophobia, discharge, redness, itching and visual disturbance.  Respiratory: Negative for cough, shortness of breath and wheezing.   Cardiovascular: Negative for chest pain, palpitations and leg swelling.  Gastrointestinal: Negative for abdominal pain, constipation, diarrhea, nausea and vomiting.  Genitourinary: Negative for dysuria and flank pain.  Musculoskeletal: Negative for arthralgias, back pain, gait problem and neck pain.  Skin: Negative for color change.  Allergic/Immunologic: Negative for environmental allergies and food allergies.  Neurological: Negative for dizziness and headaches.  Hematological: Does not bruise/bleed easily.  Psychiatric/Behavioral: Negative for agitation, behavioral problems (depression) and hallucinations.   Vital Signs: BP 124/77 (BP Location: Right Arm, Patient Position: Sitting, Cuff Size: Normal)   Pulse 88   Resp 16   Ht 5' (1.524 m)   Wt 209 lb 12.8  oz (95.2 kg)   SpO2 98%   BMI 40.97 kg/m   Physical Exam  Constitutional: She is oriented to person, place, and time. She appears well-developed and well-nourished. No distress.  HENT:  Head: Normocephalic and atraumatic.  Right Ear: External ear normal.  Left Ear:  External ear normal.  Nose: Nose normal.  Mouth/Throat: Oropharynx is clear and moist.  Eyes: Pupils are equal, round, and reactive to light. EOM are normal. Right eye exhibits no discharge. Left eye exhibits no discharge.  Neck: Normal range of motion. Neck supple. No JVD present. No tracheal deviation present. No thyromegaly present.  Cardiovascular: Normal rate, regular rhythm and normal heart sounds. Exam reveals no gallop and no friction rub.  No murmur heard. Pulmonary/Chest: Effort normal. No respiratory distress. She has no wheezes. She has no rales. She exhibits no tenderness. Right breast exhibits no mass and no tenderness. Left breast exhibits no mass and no tenderness. Breasts are symmetrical.  Abdominal: Soft. Bowel sounds are normal.  Musculoskeletal: Normal range of motion. She exhibits no edema.  Lymphadenopathy:    She has no cervical adenopathy.  Neurological: She is alert and oriented to person, place, and time. She has normal reflexes.  Skin: Skin is warm and dry. She is not diaphoretic. No erythema.  Psychiatric: She has a normal mood and affect. Her behavior is normal. Judgment and thought content normal.    LABS: Recent Results (from the past 2160 hour(s))  Urinalysis, Routine w reflex microscopic     Status: Abnormal   Collection Time: 04/06/17 12:21 PM  Result Value Ref Range   Specific Gravity, UA 1.017 1.005 - 1.030   pH, UA 6.5 5.0 - 7.5   Color, UA Yellow Yellow   Appearance Ur Clear Clear   Leukocytes, UA Negative Negative   Protein, UA 2+ (A) Negative/Trace   Glucose, UA Negative Negative   Ketones, UA Negative Negative   RBC, UA Negative Negative   Bilirubin, UA Negative Negative   Urobilinogen, Ur 0.2 0.2 - 1.0 mg/dL   Nitrite, UA Negative Negative   Microscopic Examination See below:     Comment: Microscopic was indicated and was performed.  Microscopic Examination     Status: Abnormal   Collection Time: 04/06/17 12:21 PM  Result Value Ref Range    WBC, UA 0-5 0 - 5 /hpf   RBC, UA 0-2 0 - 2 /hpf   Epithelial Cells (non renal) 0-10 0 - 10 /hpf   Casts None seen None seen /lpf   Mucus, UA Present Not Estab.   Bacteria, UA Moderate (A) None seen/Few   Assessment/Plan: 1. Encounter for general adult medical examination with abnormal findings - CBC with Differential/Platelet - Lipid Panel With LDL/HDL Ratio - TSH - T4, free - Comprehensive metabolic panel  2. Essential hypertension, malignant - Continue losartan (COZAAR) 50 MG tablet; Take 1 tablet (50 mg total) by mouth daily.  Dispense: 90 tablet; Refill: 3  3. Gastroesophageal reflux disease without esophagitis -  Continueomeprazole (PRILOSEC) 40 MG capsule; Take 1 capsule (40 mg total) by mouth daily.  Dispense: 90 capsule; Refill: 3  4. Menopause - Start and titrate  buPROPion (WELLBUTRIN XL) 150 MG 24 hr tablet; Take 1 tablet (150 mg total) by mouth daily.  Dispense: 90 tablet; Refill: 3  5. Dysuria - Urinalysis, Routine w reflex microscopic  General Counseling: Jamie Powers verbalizes understanding of the findings of todays visit and agrees with plan of treatment. I have discussed any further diagnostic evaluation that may  be needed or ordered today. We also reviewed her medications today. she has been encouraged to call the office with any questions or concerns that should arise related to todays visit.  Orders Placed This Encounter  Procedures  . Microscopic Examination  . Urinalysis, Routine w reflex microscopic  . CBC with Differential/Platelet  . Lipid Panel With LDL/HDL Ratio  . TSH  . T4, free  . Comprehensive metabolic panel    Time ZOXWR:60 [place], MD  Internal Medicine

## 2017-04-07 LAB — MICROSCOPIC EXAMINATION: Casts: NONE SEEN /lpf

## 2017-04-07 LAB — URINALYSIS, ROUTINE W REFLEX MICROSCOPIC
Bilirubin, UA: NEGATIVE
Glucose, UA: NEGATIVE
KETONES UA: NEGATIVE
Leukocytes, UA: NEGATIVE
Nitrite, UA: NEGATIVE
RBC UA: NEGATIVE
SPEC GRAV UA: 1.017 (ref 1.005–1.030)
Urobilinogen, Ur: 0.2 mg/dL (ref 0.2–1.0)
pH, UA: 6.5 (ref 5.0–7.5)

## 2017-05-06 LAB — CBC WITH DIFFERENTIAL/PLATELET
BASOS ABS: 0 10*3/uL (ref 0.0–0.2)
BASOS: 1 %
EOS (ABSOLUTE): 0.2 10*3/uL (ref 0.0–0.4)
Eos: 3 %
Hematocrit: 39 % (ref 34.0–46.6)
Hemoglobin: 12.7 g/dL (ref 11.1–15.9)
IMMATURE GRANS (ABS): 0 10*3/uL (ref 0.0–0.1)
IMMATURE GRANULOCYTES: 0 %
LYMPHS: 31 %
Lymphocytes Absolute: 2 10*3/uL (ref 0.7–3.1)
MCH: 29.1 pg (ref 26.6–33.0)
MCHC: 32.6 g/dL (ref 31.5–35.7)
MCV: 89 fL (ref 79–97)
Monocytes Absolute: 0.5 10*3/uL (ref 0.1–0.9)
Monocytes: 7 %
NEUTROS PCT: 58 %
Neutrophils Absolute: 3.7 10*3/uL (ref 1.4–7.0)
PLATELETS: 396 10*3/uL — AB (ref 150–379)
RBC: 4.37 x10E6/uL (ref 3.77–5.28)
RDW: 13.6 % (ref 12.3–15.4)
WBC: 6.4 10*3/uL (ref 3.4–10.8)

## 2017-05-06 LAB — COMPREHENSIVE METABOLIC PANEL
A/G RATIO: 1.3 (ref 1.2–2.2)
ALBUMIN: 3.9 g/dL (ref 3.6–4.8)
ALT: 13 IU/L (ref 0–32)
AST: 14 IU/L (ref 0–40)
Alkaline Phosphatase: 94 IU/L (ref 39–117)
BUN/Creatinine Ratio: 21 (ref 12–28)
BUN: 14 mg/dL (ref 8–27)
Bilirubin Total: 0.3 mg/dL (ref 0.0–1.2)
CALCIUM: 9.6 mg/dL (ref 8.7–10.3)
CO2: 25 mmol/L (ref 20–29)
Chloride: 102 mmol/L (ref 96–106)
Creatinine, Ser: 0.67 mg/dL (ref 0.57–1.00)
GFR, EST AFRICAN AMERICAN: 108 mL/min/{1.73_m2} (ref >59)
GFR, EST NON AFRICAN AMERICAN: 94 mL/min/{1.73_m2} (ref >59)
GLOBULIN, TOTAL: 3 g/dL (ref 1.5–4.5)
Glucose: 92 mg/dL (ref 65–99)
POTASSIUM: 4.6 mmol/L (ref 3.5–5.2)
SODIUM: 141 mmol/L (ref 134–144)
TOTAL PROTEIN: 6.9 g/dL (ref 6.0–8.5)

## 2017-05-06 LAB — T4, FREE: Free T4: 1.25 ng/dL (ref 0.82–1.77)

## 2017-05-06 LAB — LIPID PANEL WITH LDL/HDL RATIO
CHOLESTEROL TOTAL: 189 mg/dL (ref 100–199)
HDL: 56 mg/dL (ref >39)
LDL Calculated: 121 mg/dL — ABNORMAL HIGH (ref 0–99)
LDl/HDL Ratio: 2.2 ratio (ref 0.0–3.2)
TRIGLYCERIDES: 61 mg/dL (ref 0–149)
VLDL CHOLESTEROL CAL: 12 mg/dL (ref 5–40)

## 2017-05-06 LAB — TSH: TSH: 1.86 u[IU]/mL (ref 0.450–4.500)

## 2017-06-03 ENCOUNTER — Other Ambulatory Visit: Payer: Self-pay | Admitting: Internal Medicine

## 2017-09-26 ENCOUNTER — Encounter: Payer: Self-pay | Admitting: Adult Health

## 2017-09-26 ENCOUNTER — Ambulatory Visit: Payer: BLUE CROSS/BLUE SHIELD | Admitting: Adult Health

## 2017-09-26 VITALS — BP 128/62 | HR 96 | Temp 98.2°F | Resp 16 | Ht 59.0 in | Wt 212.8 lb

## 2017-09-26 DIAGNOSIS — M109 Gout, unspecified: Secondary | ICD-10-CM | POA: Diagnosis not present

## 2017-09-26 DIAGNOSIS — Z6841 Body Mass Index (BMI) 40.0 and over, adult: Secondary | ICD-10-CM | POA: Diagnosis not present

## 2017-09-26 DIAGNOSIS — I10 Essential (primary) hypertension: Secondary | ICD-10-CM

## 2017-09-26 MED ORDER — COLCHICINE 0.6 MG PO TABS: ORAL_TABLET | ORAL | 0 refills | Status: DC

## 2017-09-26 NOTE — Progress Notes (Signed)
[facility] [place], [place] [address]  Internal MEDICINE  Office Visit Note  Patient Name: Jamie Powers  [id number]  [id number]  Date of Service: 09/29/2017  Chief Complaint  Patient presents with  . Gout    RIght foot , hallux  joint , red hot and swollen started last Friday.          HPI Pt is here for a sick visit.  She has right great toe pain in Proximal IP joint.  She is unable to move the toe at this time.  It is red and painful to touch. She denies history of gout.  She reports pain with walking, and movement.    Current Medication:  Outpatient Encounter Medications as of 09/26/2017  Medication Sig  . aspirin EC 81 MG tablet Take 81 mg by mouth daily.  Marland Kitchen buPROPion (WELLBUTRIN XL) 150 MG 24 hr tablet Take 1 tablet (150 mg total) by mouth daily.  . hydrocortisone 2.5 % cream Apply 1 application topically as needed.  Marland Kitchen losartan (COZAAR) 50 MG tablet Take 1 tablet (50 mg total) by mouth daily.  Marland Kitchen nystatin cream (MYCOSTATIN) APPLY A SMALL AMOUNT TO THE AFFECTED AREA 2-3 TIMES A DAY  . omeprazole (PRILOSEC) 40 MG capsule Take 1 capsule (40 mg total) by mouth daily.  Marland Kitchen AFLURIA QUADRIVALENT 0.5 ML injection TO BE ADMINISTERED BY PHARMACIST FOR IMMUNIZATION  . colchicine 0.6 MG tablet Take 2 tablets initially, and then one tablet an hour later.  Wait three days and repeat dosing as needed for flares   No facility-administered encounter medications on file as of 09/26/2017.       Medical History: Past Medical History:  Diagnosis Date  . Hypertension   . Obesity   . Protein in urine   . Sleep apnea    Vital Signs: BP 128/62   Pulse 96   Temp 98.2 F (36.8 C)   Resp 16   Ht 4\' 11"  (1.499 m)   Wt 212 lb 12.8 oz (96.5 kg)   SpO2 95%   BMI 42.98 kg/m    Review of Systems  Constitutional: Negative for chills, fatigue and unexpected weight change.  HENT: Negative for congestion, rhinorrhea, sneezing and sore throat.   Eyes: Negative for  photophobia, pain and redness.  Respiratory: Negative for cough, chest tightness and shortness of breath.   Cardiovascular: Negative for chest pain and palpitations.  Gastrointestinal: Negative for abdominal pain, constipation, diarrhea, nausea and vomiting.  Endocrine: Negative.   Genitourinary: Negative for dysuria and frequency.  Musculoskeletal: Positive for joint swelling. Negative for arthralgias, back pain and neck pain.       Right Great toe IP joint pain.   Skin: Negative for rash.  Allergic/Immunologic: Negative.   Neurological: Negative for tremors and numbness.  Hematological: Negative for adenopathy. Does not bruise/bleed easily.  Psychiatric/Behavioral: Negative for behavioral problems and sleep disturbance. The patient is not nervous/anxious.     Physical Exam  Constitutional: She is oriented to person, place, and time. She appears well-developed and well-nourished. No distress.  HENT:  Head: Normocephalic and atraumatic.  Mouth/Throat: Oropharynx is clear and moist. No oropharyngeal exudate.  Eyes: Pupils are equal, round, and reactive to light. EOM are normal.  Neck: Normal range of motion. Neck supple. No JVD present. No tracheal deviation present. No thyromegaly present.  Cardiovascular: Normal rate, regular rhythm and normal heart sounds. Exam reveals no gallop and no friction rub.  No murmur heard. Pulmonary/Chest: Effort normal and breath  sounds normal. No respiratory distress. She has no wheezes. She has no rales. She exhibits no tenderness.  Abdominal: Soft. There is no tenderness. There is no guarding.  Musculoskeletal: Normal range of motion.  Pt has decreased ROM to Right great toe joint.  Reddness and swelling noted.   Lymphadenopathy:    She has no cervical adenopathy.  Neurological: She is alert and oriented to person, place, and time. No cranial nerve deficit.  Skin: Skin is warm and dry. She is not diaphoretic.  Psychiatric: She has a normal mood and  affect. Her behavior is normal. Judgment and thought content normal.  Nursing note and vitals reviewed.   Assessment/Plan: 1. Acute gout involving toe of right foot, unspecified cause Treat gout as indicated.  - colchicine 0.6 MG tablet; Take 2 tablets initially, and then one tablet an hour later.  Wait three days and repeat dosing as needed for flares  Dispense: 10 tablet; Refill: 0  2. Hypertension, unspecified type Stable, continue current medication  3. Class 3 severe obesity due to excess calories with serious comorbidity and body mass index (BMI) of 40.0 to 44.9 in adult [facility]) Obesity Counseling: Risk Assessment: An assessment of behavioral risk factors was made today and includes lack of exercise sedentary lifestyle, lack of portion control and poor dietary habits.  Risk Modification Advice: She was counseled on portion control guidelines. Restricting daily caloric intake to. . The detrimental long term effects of obesity on her health and ongoing poor compliance was also discussed with the patient.  General Counseling: addaleigh nicholls understanding of the findings of todays visit and agrees with plan of treatment. I have discussed any further diagnostic evaluation that may be needed or ordered today. We also reviewed her medications today. she has been encouraged to call the office with any questions or concerns that should arise related to todays visit.   Orders Placed This Encounter  Procedures  . Uric acid  . CBC With Differential    Meds ordered this encounter  Medications  . colchicine 0.6 MG tablet    Sig: Take 2 tablets initially, and then one tablet an hour later.  Wait three days and repeat dosing as needed for flares    Dispense:  10 tablet    Refill:  0    Time spent: 25 Minutes  This patient was seen by Orson Gear AGNP-C in Collaboration with Dr Lavera Guise as a part of collaborative care agreement

## 2017-09-26 NOTE — Patient Instructions (Signed)
Low-Purine Diet Purines are compounds that affect the level of uric acid in your body. A low-purine diet is a diet that is low in purines. Eating a low-purine diet can prevent the level of uric acid in your body from getting too high and causing gout or kidney stones or both. What do I need to know about this diet?  Choose low-purine foods. Examples of low-purine foods are listed in the next section.  Drink plenty of fluids, especially water. Fluids can help remove uric acid from your body. Try to drink 8-16 cups (1.9-3.8 L) a day.  Limit foods high in fat, especially saturated fat, as fat makes it harder for the body to get rid of uric acid. Foods high in saturated fat include pizza, cheese, ice cream, whole milk, fried foods, and gravies. Choose foods that are lower in fat and lean sources of protein. Use olive oil when cooking as it contains healthy fats that are not high in saturated fat.  Limit alcohol. Alcohol interferes with the elimination of uric acid from your body. If you are having a gout attack, avoid all alcohol.  Keep in mind that different people's bodies react differently to different foods. You will probably learn over time which foods do or do not affect you. If you discover that a food tends to cause your gout to flare up, avoid eating that food. You can more freely enjoy foods that do not cause problems. If you have any questions about a food item, talk to your dietitian or health care provider. Which foods are low, moderate, and high in purines? The following is a list of foods that are low, moderate, and high in purines. You can eat any amount of the foods that are low in purines. You may be able to have small amounts of foods that are moderate in purines. Ask your health care provider how much of a food moderate in purines you can have. Avoid foods high in purines. Grains  Foods low in purines: Enriched white bread, pasta, rice, cake, cornbread, popcorn.  Foods moderate in  purines: Whole-grain breads and cereals, wheat germ, bran, oatmeal. Uncooked oatmeal. Dry wheat bran or wheat germ.  Foods high in purines: Pancakes, French toast, biscuits, muffins. Vegetables  Foods low in purines: All vegetables, except those that are moderate in purines.  Foods moderate in purines: Asparagus, cauliflower, spinach, mushrooms, green peas. Fruits  All fruits are low in purines. Meats and other Protein Foods  Foods low in purines: Eggs, nuts, peanut butter.  Foods moderate in purines: 80-90% lean beef, lamb, veal, pork, poultry, fish, eggs, peanut butter, nuts. Crab, lobster, oysters, and shrimp. Cooked dried beans, peas, and lentils.  Foods high in purines: Anchovies, sardines, herring, mussels, tuna, codfish, scallops, trout, and haddock. Bacon. Organ meats (such as liver or kidney). Tripe. Game meat. Goose. Sweetbreads. Dairy  All dairy foods are low in purines. Low-fat and fat-free dairy products are best because they are low in saturated fat. Beverages  Drinks low in purines: Water, carbonated beverages, tea, coffee, cocoa.  Drinks moderate in purines: Soft drinks and other drinks sweetened with high-fructose corn syrup. Juices. To find whether a food or drink is sweetened with high-fructose corn syrup, look at the ingredients list.  Drinks high in purines: Alcoholic beverages (such as beer). Condiments  Foods low in purines: Salt, herbs, olives, pickles, relishes, vinegar.  Foods moderate in purines: Butter, margarine, oils, mayonnaise. Fats and Oils  Foods low in purines: All types, except gravies   and sauces made with meat.  Foods high in purines: Gravies and sauces made with meat. Other Foods  Foods low in purines: Sugars, sweets, gelatin. Cake. Soups made without meat.  Foods moderate in purines: Meat-based or fish-based soups, broths, or bouillons. Foods and drinks sweetened with high-fructose corn syrup.  Foods high in purines: High-fat desserts  (such as ice cream, cookies, cakes, pies, doughnuts, and chocolate). Contact your dietitian for more information on foods that are not listed here. This information is not intended to replace advice given to you by your health care provider. Make sure you discuss any questions you have with your health care provider. Document Released: 04/17/2010 Document Revised: 05/29/2015 Document Reviewed: 11/27/2012 Elsevier Interactive Patient Education  2017 [employer]. Gout Gout is painful swelling that can happen in some of your joints. Gout is a type of arthritis. This condition is caused by having too much uric acid in your body. Uric acid is a chemical that is made when your body breaks down substances called purines. If your body has too much uric acid, sharp crystals can form and build up in your joints. This causes pain and swelling. Gout attacks can happen quickly and be very painful (acute gout). Over time, the attacks can affect more joints and happen more often (chronic gout). Follow these instructions at home: During a Gout Attack  If directed, put ice on the painful area: ? Put ice in a plastic bag. ? Place a towel between your skin and the bag. ? Leave the ice on for 20 minutes, 2-3 times a day.  Rest the joint as much as possible. If the joint is in your leg, you may be given crutches to use.  Raise (elevate) the painful joint above the level of your heart as often as you can.  Drink enough fluids to keep your pee (urine) clear or pale yellow.  Take over-the-counter and prescription medicines only as told by your doctor.  Do not drive or use heavy machinery while taking prescription pain medicine.  Follow instructions from your doctor about what you can or cannot eat and drink.  Return to your normal activities as told by your doctor. Ask your doctor what activities are safe for you. Avoiding Future Gout Attacks  Follow a low-purine diet as told by a specialist (dietitian) or  your doctor. Avoid foods and drinks that have a lot of purines, such as: ? Liver. ? Kidney. ? Anchovies. ? Asparagus. ? Herring. ? Mushrooms ? Mussels. ? Beer.  Limit alcohol intake to no more than 1 drink a day for nonpregnant women and 2 drinks a day for men. One drink equals 12 oz of beer, 5 oz of wine, or 1 oz of hard liquor.  Stay at a healthy weight or lose weight if you are overweight. If you want to lose weight, talk with your doctor. It is important that you do not lose weight too fast.  Start or continue an exercise plan as told by your doctor.  Drink enough fluids to keep your pee clear or pale yellow.  Take over-the-counter and prescription medicines only as told by your doctor.  Keep all follow-up visits as told by your doctor. This is important. Contact a doctor if:  You have another gout attack.  You still have symptoms of a gout attack after10 days of treatment.  You have problems (side effects) because of your medicines.  You have chills or a fever.  You have burning pain when you pee (  urinate).  You have pain in your lower back or belly. Get help right away if:  You have very bad pain.  Your pain cannot be controlled.  You cannot pee. This information is not intended to replace advice given to you by your health care provider. Make sure you discuss any questions you have with your health care provider. Document Released: 09/30/2007 Document Revised: 05/29/2015 Document Reviewed: 10/03/2014 Elsevier Interactive Patient Education  [employer].

## 2017-09-27 ENCOUNTER — Encounter: Payer: Self-pay | Admitting: Podiatry

## 2017-09-27 ENCOUNTER — Ambulatory Visit: Payer: BLUE CROSS/BLUE SHIELD | Admitting: Podiatry

## 2017-09-27 DIAGNOSIS — M779 Enthesopathy, unspecified: Secondary | ICD-10-CM

## 2017-09-27 DIAGNOSIS — M7752 Other enthesopathy of left foot: Secondary | ICD-10-CM

## 2017-09-27 DIAGNOSIS — L989 Disorder of the skin and subcutaneous tissue, unspecified: Secondary | ICD-10-CM

## 2017-09-27 NOTE — Progress Notes (Signed)
   Subjective: 62 year old female presents to the office today for follow up evaluation of a callus lesion located at the sub-5th MPJ of the left foot. She denies any pain or modifying factors at this time but states the callus needs trimming. She denies any new complaints. Patient presents today for further treatment and evaluation.   Past Medical History:  Diagnosis Date  . Hypertension   . Obesity   . Protein in urine   . Sleep apnea     Objective:  Physical Exam General: Alert and oriented x3 in no acute distress  Dermatology: Hyperkeratotic lesion present on the sub-5th MPJ of the left foot. Pain on palpation with a central nucleated core noted.  Skin is warm, dry and supple bilateral lower extremities. Negative for open lesions or macerations.  Vascular: Palpable pedal pulses bilaterally. No edema or erythema noted. Capillary refill within normal limits.  Neurological: Epicritic and protective threshold grossly intact bilaterally.   Musculoskeletal Exam: Pain on palpation at the keratotic lesion noted. Range of motion within normal limits bilateral. Muscle strength 5/5 in all groups bilateral.  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 #2 Excisional debridement of keratoic lesion using a chisel blade was performed without incident.  #3 Treated area(s) with Salinocaine and dressed with light dressing. #4 Injection of 0.5 mLs Celestone Soluspan injected into the 5th MPJ of the left foot.  #5 Return to clinic as needed.    Edrick Kins, DPM [facility]  Dr. Edrick Kins, [address]                                        [place], [place] [address]                Office [phone]  Fax [phone]

## 2017-10-17 ENCOUNTER — Encounter: Payer: Self-pay | Admitting: Nurse Practitioner

## 2017-10-17 ENCOUNTER — Ambulatory Visit: Payer: BLUE CROSS/BLUE SHIELD | Admitting: Nurse Practitioner

## 2017-10-17 VITALS — BP 135/94 | HR 82 | Resp 16 | Ht 60.0 in | Wt 209.4 lb

## 2017-10-17 DIAGNOSIS — N951 Menopausal and female climacteric states: Secondary | ICD-10-CM

## 2017-10-17 DIAGNOSIS — Z1211 Encounter for screening for malignant neoplasm of colon: Secondary | ICD-10-CM

## 2017-10-17 DIAGNOSIS — Z124 Encounter for screening for malignant neoplasm of cervix: Secondary | ICD-10-CM | POA: Insufficient documentation

## 2017-10-17 DIAGNOSIS — R079 Chest pain, unspecified: Secondary | ICD-10-CM | POA: Diagnosis not present

## 2017-10-17 DIAGNOSIS — R0602 Shortness of breath: Secondary | ICD-10-CM | POA: Diagnosis not present

## 2017-10-17 DIAGNOSIS — Z1239 Encounter for other screening for malignant neoplasm of breast: Secondary | ICD-10-CM

## 2017-10-17 DIAGNOSIS — J3089 Other allergic rhinitis: Secondary | ICD-10-CM

## 2017-10-17 DIAGNOSIS — R809 Proteinuria, unspecified: Secondary | ICD-10-CM | POA: Diagnosis not present

## 2017-10-17 DIAGNOSIS — I10 Essential (primary) hypertension: Secondary | ICD-10-CM

## 2017-10-17 MED ORDER — CETIRIZINE HCL 10 MG PO TABS: 10.0000 mg | ORAL_TABLET | Freq: Every day | ORAL | 11 refills | Status: DC

## 2017-10-17 MED ORDER — FLUOXETINE HCL 10 MG PO TABS: 10.0000 mg | ORAL_TABLET | Freq: Every day | ORAL | 3 refills | Status: DC

## 2017-10-17 NOTE — Addendum Note (Signed)
Addended by: Leretha Pol on: 10/17/2017 03:28 PM   Modules accepted: Orders

## 2017-10-17 NOTE — Progress Notes (Addendum)
[facility] [place], [place] [address]  Internal MEDICINE  Office Visit Note  Patient Name: Jamie Powers  [id number]  [id number]  Date of Service: 10/17/2017  Chief Complaint  Patient presents with  . Medical Management of Chronic Issues    35month follow up. pt have concerns about medication  . Hypertension  . Cough    started over the weekend (friday night)    The patient was started on wellbutrin at her last visit due to help with hot flashes. She states that she has noted no difference. Gets very hot, sweaty around her head and hairline. Embarrassing when this happens, especially when out in public.  She states that she has intermittent chest pain. Lasts for a minute or two then goes away. States that her father used to have similar symptoms with sweating and had heart valve replaced. He ended up passing away from coronary artery disease.  She has noted a "funny small" to her urine. Has long history of proteinuria. Was seeing nephrology. Her provider left the practice earlier this year and she has not been back since.    Current Medication: Outpatient Encounter Medications as of 10/17/2017  Medication Sig  . AFLURIA QUADRIVALENT 0.5 ML injection TO BE ADMINISTERED BY PHARMACIST FOR IMMUNIZATION  . aspirin EC 81 MG tablet Take 81 mg by mouth daily.  Marland Kitchen buPROPion (WELLBUTRIN XL) 150 MG 24 hr tablet Take 1 tablet (150 mg total) by mouth daily.  . colchicine 0.6 MG tablet Take 2 tablets initially, and then one tablet an hour later.  Wait three days and repeat dosing as needed for flares  . hydrocortisone 2.5 % cream Apply 1 application topically as needed.  Marland Kitchen losartan (COZAAR) 50 MG tablet Take 1 tablet (50 mg total) by mouth daily.  Marland Kitchen nystatin cream (MYCOSTATIN) APPLY A SMALL AMOUNT TO THE AFFECTED AREA 2-3 TIMES A DAY  . omeprazole (PRILOSEC) 40 MG capsule Take 1 capsule (40 mg total) by mouth daily.  Marland Kitchen FLUoxetine (PROZAC) 10 MG tablet Take 1 tablet (10  mg total) by mouth daily.   No facility-administered encounter medications on file as of 10/17/2017.     Surgical History: Past Surgical History:  Procedure Laterality Date  . BREAST BIOPSY Left 2000  . BREAST SURGERY    . CHOLECYSTECTOMY    . COLONOSCOPY WITH PROPOFOL N/A 01/31/2015   Procedure: COLONOSCOPY WITH PROPOFOL;  Surgeon: Lollie Sails, MD;  Location: [facility] ENDOSCOPY;  Service: Endoscopy;  Laterality: N/A;  . TONSILLECTOMY      Medical History: Past Medical History:  Diagnosis Date  . Hypertension   . Obesity   . Protein in urine   . Sleep apnea     Family History: Family History  Problem Relation Age of Onset  . Breast cancer Paternal Aunt     Social History   Socioeconomic History  . Marital status: Married    Spouse name: Not on file  . Number of children: Not on file  . Years of education: Not on file  . Highest education level: Not on file  Occupational History  . Not on file  Social Needs  . Financial resource strain: Not on file  . Food insecurity:    Worry: Not on file    Inability: Not on file  . Transportation needs:    Medical: Not on file    Non-medical: Not on file  Tobacco Use  . Smoking status: Never Smoker  . Smokeless tobacco: Never Used  Substance and Sexual Activity  . Alcohol use: No  . Drug use: No  . Sexual activity: Not on file  Lifestyle  . Physical activity:    Days per week: Not on file    Minutes per session: Not on file  . Stress: Not on file  Relationships  . Social connections:    Talks on phone: Not on file    Gets together: Not on file    Attends religious service: Not on file    Active member of club or organization: Not on file    Attends meetings of clubs or organizations: Not on file    Relationship status: Not on file  . Intimate partner violence:    Fear of current or ex partner: Not on file    Emotionally abused: Not on file    Physically abused: Not on file    Forced sexual activity: Not on  file  Other Topics Concern  . Not on file  Social History Narrative  . Not on file    Review of Systems  Constitutional: Negative for activity change, chills, fatigue and unexpected weight change.  HENT: Negative for congestion, postnasal drip, rhinorrhea, sneezing and sore throat.   Eyes: Negative.  Negative for redness.  Respiratory: Negative for cough, chest tightness, shortness of breath and wheezing.   Cardiovascular: Negative for chest pain and palpitations.  Gastrointestinal: Negative for abdominal pain, constipation, diarrhea, nausea and vomiting.  Endocrine: Negative for cold intolerance, heat intolerance, polydipsia, polyphagia and polyuria.       Hot flashes. Not any better with wellbutrin.   Genitourinary: Negative.  Negative for dysuria and frequency.  Musculoskeletal: Negative for arthralgias, back pain, joint swelling and neck pain.  Skin: Negative for rash.  Allergic/Immunologic: Negative for environmental allergies.  Neurological: Negative for dizziness, tremors, numbness and headaches.  Hematological: Negative for adenopathy. Does not bruise/bleed easily.  Psychiatric/Behavioral: Negative for behavioral problems (Depression), dysphoric mood, sleep disturbance and suicidal ideas. The patient is not nervous/anxious.     Today's Vitals   10/17/17 1358  BP: (!) 135/94  Pulse: 82  Resp: 16  SpO2: 96%  Weight: 209 lb 6.4 oz (95 kg)  Height: 5' (1.524 m)    Physical Exam  Constitutional: She is oriented to person, place, and time. She appears well-developed and well-nourished. No distress.  HENT:  Head: Normocephalic and atraumatic.  Nose: Nose normal.  Mouth/Throat: Oropharynx is clear and moist. No oropharyngeal exudate.  Eyes: Pupils are equal, round, and reactive to light. Conjunctivae and EOM are normal.  Neck: Normal range of motion. Neck supple. No JVD present. Carotid bruit is not present. No tracheal deviation present. No thyromegaly present.   Cardiovascular: Normal rate, regular rhythm and normal heart sounds. Exam reveals no gallop and no friction rub.  No murmur heard. ECG done in the office today was within normal limits .  Pulmonary/Chest: Effort normal and breath sounds normal. No respiratory distress. She has no wheezes. She has no rales. She exhibits no tenderness.  Abdominal: Soft.  Musculoskeletal: Normal range of motion.  Lymphadenopathy:    She has no cervical adenopathy.  Neurological: She is alert and oriented to person, place, and time. No cranial nerve deficit.  Skin: Skin is warm and dry. She is not diaphoretic.  Psychiatric: She has a normal mood and affect. Her behavior is normal. Judgment and thought content normal.  Nursing note and vitals reviewed.   Assessment/Plan: 1. Chest pain, unspecified type ECG done today is within normal limits.  Will monitor.  2. SOB (shortness of breath) ECG done today is within normal limits. Will monitor. - EKG 12-Lead  3. Vasomotor symptoms due to menopause Start fluoxetine 10mg  daily. insructions provided to gradually wean off bupropion. Will monitor closely.  - FLUoxetine (PROZAC) 10 MG tablet; Take 1 tablet (10 mg total) by mouth daily.  Dispense: 30 tablet; Refill: 3  4. Proteinuria, unspecified type Was seeing nephrology in the past. Will get new referral for continued evaluation and treatment.  - Ambulatory referral to Nephrology  5. Hypertension, unspecified type Stable. Continue bp medication as prescribed   6. Screening for breast cancer - MM DIGITAL SCREENING BILATERAL; Future  General Counseling: tashari schoenfelder understanding of the findings of todays visit and agrees with plan of treatment. I have discussed any further diagnostic evaluation that may be needed or ordered today. We also reviewed her medications today. she has been encouraged to call the office with any questions or concerns that should arise related to todays visit.  Hypertension  Counseling:   The following hypertensive lifestyle modification were recommended and discussed:  1. Limiting alcohol intake to less than 1 oz/day of ethanol:(24 oz of beer or 8 oz of wine or 2 oz of 100-proof whiskey). 2. Take baby ASA 81 mg daily. 3. Importance of regular aerobic exercise and losing weight. 4. Reduce dietary saturated fat and cholesterol intake for overall cardiovascular health. 5. Maintaining adequate dietary potassium, calcium, and magnesium intake. 6. Regular monitoring of the blood pressure. 7. Reduce sodium intake to less than 100 mmol/day (less than 2.3 gm of sodium or less than 6 gm of sodium choride)   This patient was seen by [place] with Dr Lavera Guise as a part of collaborative care agreement  Orders Placed This Encounter  Procedures  . MM DIGITAL SCREENING BILATERAL  . Ambulatory referral to Nephrology  . EKG 12-Lead    Meds ordered this encounter  Medications  . FLUoxetine (PROZAC) 10 MG tablet    Sig: Take 1 tablet (10 mg total) by mouth daily.    Dispense:  30 tablet    Refill:  3    Order Specific Question:   Supervising Provider    Answer:   Lavera Guise [[id number]]    Time spent: 68 Minutes   Dr Lavera Guise Internal medicine

## 2017-11-08 ENCOUNTER — Other Ambulatory Visit: Payer: Self-pay | Admitting: Nurse Practitioner

## 2017-11-08 DIAGNOSIS — N951 Menopausal and female climacteric states: Secondary | ICD-10-CM

## 2017-11-11 ENCOUNTER — Ambulatory Visit
Admission: RE | Admit: 2017-11-11 | Discharge: 2017-11-11 | Disposition: A | Discharge status: Home / Self Care | Payer: BLUE CROSS/BLUE SHIELD | Source: Ambulatory Visit | Attending: Nurse Practitioner | Admitting: Nurse Practitioner

## 2017-11-11 ENCOUNTER — Other Ambulatory Visit: Payer: Self-pay | Admitting: Nurse Practitioner

## 2017-11-11 DIAGNOSIS — Z1239 Encounter for other screening for malignant neoplasm of breast: Secondary | ICD-10-CM

## 2018-01-09 ENCOUNTER — Other Ambulatory Visit: Payer: Self-pay | Admitting: Internal Medicine

## 2018-01-09 DIAGNOSIS — K219 Gastro-esophageal reflux disease without esophagitis: Secondary | ICD-10-CM

## 2018-01-11 ENCOUNTER — Other Ambulatory Visit: Payer: Self-pay | Admitting: Nurse Practitioner

## 2018-01-11 DIAGNOSIS — N951 Menopausal and female climacteric states: Secondary | ICD-10-CM

## 2018-01-19 ENCOUNTER — Ambulatory Visit: Payer: Self-pay | Admitting: Nurse Practitioner

## 2018-01-20 ENCOUNTER — Encounter: Payer: Self-pay | Admitting: Nurse Practitioner

## 2018-01-20 ENCOUNTER — Ambulatory Visit: Payer: BLUE CROSS/BLUE SHIELD | Admitting: Nurse Practitioner

## 2018-01-20 VITALS — BP 136/73 | HR 79 | Resp 16 | Ht 60.0 in | Wt 206.8 lb

## 2018-01-20 DIAGNOSIS — N951 Menopausal and female climacteric states: Secondary | ICD-10-CM | POA: Diagnosis not present

## 2018-01-20 DIAGNOSIS — I10 Essential (primary) hypertension: Secondary | ICD-10-CM

## 2018-01-20 DIAGNOSIS — Z1211 Encounter for screening for malignant neoplasm of colon: Secondary | ICD-10-CM

## 2018-01-20 DIAGNOSIS — M25561 Pain in right knee: Secondary | ICD-10-CM | POA: Diagnosis not present

## 2018-01-20 MED ORDER — DICLOFENAC SODIUM 1 % TD GEL: 4.0000 g | Freq: Four times a day (QID) | TRANSDERMAL | 3 refills | Status: DC

## 2018-01-20 MED ORDER — LOSARTAN POTASSIUM 50 MG PO TABS: 50.0000 mg | ORAL_TABLET | Freq: Every day | ORAL | 3 refills | Status: DC

## 2018-01-20 NOTE — Progress Notes (Signed)
[facility] [place], [place] [address]  Internal MEDICINE  Office Visit Note  Patient Name: Jamie Powers  [id number]  [id number]  Date of Service: 01/29/2018  Chief Complaint  Patient presents with  . Medical Management of Chronic Issues    30month follow up  . Hypertension    The patient is here for routine follow up visit. She is due to have screening colonoscopy.   Hypertension  This is a chronic problem. The problem is unchanged. The problem is controlled. Pertinent negatives include no chest pain, headaches, neck pain, palpitations or shortness of breath. There are no associated agents to hypertension. Risk factors for coronary artery disease include dyslipidemia and post-menopausal state. Past treatments include angiotensin blockers. The current treatment provides moderate improvement. Compliance problems include exercise.        Current Medication: Outpatient Encounter Medications as of 01/20/2018  Medication Sig  . AFLURIA QUADRIVALENT 0.5 ML injection TO BE ADMINISTERED BY PHARMACIST FOR IMMUNIZATION  . aspirin EC 81 MG tablet Take 81 mg by mouth daily.  . cetirizine (ZYRTEC) 10 MG tablet Take 1 tablet (10 mg total) by mouth daily.  . colchicine 0.6 MG tablet Take 2 tablets initially, and then one tablet an hour later.  Wait three days and repeat dosing as needed for flares  . Fluoxetine HCl, PMDD, 10 MG TABS TAKE 1 TABLET BY MOUTH EVERY DAY  . hydrocortisone 2.5 % cream Apply 1 application topically as needed.  Marland Kitchen losartan (COZAAR) 50 MG tablet Take 1 tablet (50 mg total) by mouth daily.  Marland Kitchen nystatin cream (MYCOSTATIN) APPLY A SMALL AMOUNT TO THE AFFECTED AREA 2-3 TIMES A DAY  . omeprazole (PRILOSEC) 40 MG capsule TAKE 1 CAPSULE BY MOUTH EVERY DAY  . [DISCONTINUED] losartan (COZAAR) 50 MG tablet Take 1 tablet (50 mg total) by mouth daily.  . diclofenac sodium (VOLTAREN) 1 % GEL Apply 4 g topically 4 (four) times daily.   No  facility-administered encounter medications on file as of 01/20/2018.     Surgical History: Past Surgical History:  Procedure Laterality Date  . BREAST BIOPSY Left 2000   neg  . BREAST SURGERY    . CHOLECYSTECTOMY    . COLONOSCOPY WITH PROPOFOL N/A 01/31/2015   Procedure: COLONOSCOPY WITH PROPOFOL;  Surgeon: Lollie Sails, MD;  Location: [facility] ENDOSCOPY;  Service: Endoscopy;  Laterality: N/A;  . TONSILLECTOMY      Medical History: Past Medical History:  Diagnosis Date  . Hypertension   . Obesity   . Protein in urine   . Sleep apnea     Family History: Family History  Problem Relation Age of Onset  . Breast cancer Paternal Aunt     Social History   Socioeconomic History  . Marital status: Married    Spouse name: Not on file  . Number of children: Not on file  . Years of education: Not on file  . Highest education level: Not on file  Occupational History  . Not on file  Social Needs  . Financial resource strain: Not on file  . Food insecurity:    Worry: Not on file    Inability: Not on file  . Transportation needs:    Medical: Not on file    Non-medical: Not on file  Tobacco Use  . Smoking status: Never Smoker  . Smokeless tobacco: Never Used  Substance and Sexual Activity  . Alcohol use: No  . Drug use: No  . Sexual activity: Not on file  Lifestyle  . Physical activity:    Days per week: Not on file    Minutes per session: Not on file  . Stress: Not on file  Relationships  . Social connections:    Talks on phone: Not on file    Gets together: Not on file    Attends religious service: Not on file    Active member of club or organization: Not on file    Attends meetings of clubs or organizations: Not on file    Relationship status: Not on file  . Intimate partner violence:    Fear of current or ex partner: Not on file    Emotionally abused: Not on file    Physically abused: Not on file    Forced sexual activity: Not on file  Other Topics Concern   . Not on file  Social History Narrative  . Not on file      Review of Systems  Constitutional: Negative for activity change, chills, fatigue and unexpected weight change.  HENT: Negative for congestion, postnasal drip, rhinorrhea, sneezing and sore throat.   Respiratory: Negative for cough, chest tightness, shortness of breath and wheezing.   Cardiovascular: Negative for chest pain and palpitations.  Gastrointestinal: Negative for abdominal pain, constipation, diarrhea, nausea and vomiting.  Endocrine: Negative for cold intolerance, heat intolerance, polydipsia, polyphagia and polyuria.       Hot flashes.improved some on low dose fluoxetine.   Musculoskeletal: Positive for arthralgias. Negative for back pain, joint swelling and neck pain.  Skin: Negative for rash.  Allergic/Immunologic: Negative for environmental allergies.  Neurological: Negative for dizziness, tremors, numbness and headaches.  Hematological: Negative for adenopathy. Does not bruise/bleed easily.  Psychiatric/Behavioral: Negative for behavioral problems (Depression), dysphoric mood, sleep disturbance and suicidal ideas. The patient is nervous/anxious.     Today's Vitals   01/20/18 0935  BP: 136/73  Pulse: 79  Resp: 16  SpO2: 95%  Weight: 206 lb 12.8 oz (93.8 kg)  Height: 5' (1.524 m)   Body mass index is 40.39 kg/m.  Physical Exam Vitals signs and nursing note reviewed.  Constitutional:      General: She is not in acute distress.    Appearance: Normal appearance. She is well-developed. She is not diaphoretic.  HENT:     Head: Normocephalic and atraumatic.     Nose: Nose normal.     Mouth/Throat:     Pharynx: No oropharyngeal exudate.  Eyes:     Conjunctiva/sclera: Conjunctivae normal.     Pupils: Pupils are equal, round, and reactive to light.  Neck:     Musculoskeletal: Normal range of motion and neck supple.     Thyroid: No thyromegaly.     Vascular: No carotid bruit or JVD.     Trachea: No  tracheal deviation.  Cardiovascular:     Rate and Rhythm: Normal rate and regular rhythm.     Heart sounds: Normal heart sounds. No murmur. No friction rub. No gallop.   Pulmonary:     Effort: Pulmonary effort is normal. No respiratory distress.     Breath sounds: Normal breath sounds. No wheezing or rales.  Chest:     Chest wall: No tenderness.  Abdominal:     Palpations: Abdomen is soft.     Tenderness: There is no abdominal tenderness.  Musculoskeletal: Normal range of motion.  Lymphadenopathy:     Cervical: No cervical adenopathy.  Skin:    General: Skin is warm and dry.  Neurological:     Mental Status:  She is alert and oriented to person, place, and time.     Cranial Nerves: No cranial nerve deficit.  Psychiatric:        Behavior: Behavior normal.        Thought Content: Thought content normal.        Judgment: Judgment normal.   Assessment/Plan: 1. Essential hypertension Stable. Continue bp medication as prescribed  - losartan (COZAAR) 50 MG tablet; Take 1 tablet (50 mg total) by mouth daily.  Dispense: 90 tablet; Refill: 3  2. Pain in joint of right knee May continue to use voltaren gel, applied to affected joint up to four times daily as needed. New prescrip;tion provided today.  - diclofenac sodium (VOLTAREN) 1 % GEL; Apply 4 g topically 4 (four) times daily.  Dispense: 100 g; Refill: 3  3. Vasomotor symptoms due to menopause Improved. Continue with low dose fluoxetine as previously prescribed   4. Screening for colon cancer Refer to GI for screening colonoscopy.  - Ambulatory referral to Gastroenterology  General Counseling: teka chanda understanding of the findings of todays visit and agrees with plan of treatment. I have discussed any further diagnostic evaluation that may be needed or ordered today. We also reviewed her medications today. she has been encouraged to call the office with any questions or concerns that should arise related to todays  visit.  Hypertension Counseling:   The following hypertensive lifestyle modification were recommended and discussed:  1. Limiting alcohol intake to less than 1 oz/day of ethanol:(24 oz of beer or 8 oz of wine or 2 oz of 100-proof whiskey). 2. Take baby ASA 81 mg daily.dfk 3. Importance of regular aerobic exercise and losing weight. 4. Reduce dietary saturated fat and cholesterol intake for overall cardiovascular health. 5. Maintaining adequate dietary potassium, calcium, and magnesium intake. 6. Regular monitoring of the blood pressure. 7. Reduce sodium intake to less than 100 mmol/day (less than 2.3 gm of sodium or less than 6 gm of sodium choride)   .  Orders Placed This Encounter  Procedures  . Ambulatory referral to Gastroenterology    Meds ordered this encounter  Medications  . losartan (COZAAR) 50 MG tablet    Sig: Take 1 tablet (50 mg total) by mouth daily.    Dispense:  90 tablet    Refill:  3    Order Specific Question:   Supervising Provider    Answer:   Lavera Guise [[id number]]  . diclofenac sodium (VOLTAREN) 1 % GEL    Sig: Apply 4 g topically 4 (four) times daily.    Dispense:  100 g    Refill:  3    Order Specific Question:   Supervising Provider    Answer:   Lavera Guise [[id number]]    Time spent: 41 Minutes      Dr Lavera Guise Internal medicine

## 2018-01-23 ENCOUNTER — Telehealth: Payer: Self-pay | Admitting: Nurse Practitioner

## 2018-01-23 NOTE — Telephone Encounter (Signed)
Prior authorization for medication voltaren gel has been approved through insurance , pharmacy notified

## 2018-01-29 DIAGNOSIS — M25561 Pain in right knee: Secondary | ICD-10-CM | POA: Insufficient documentation

## 2018-02-08 ENCOUNTER — Other Ambulatory Visit: Payer: Self-pay

## 2018-02-08 DIAGNOSIS — J3089 Other allergic rhinitis: Secondary | ICD-10-CM

## 2018-02-08 MED ORDER — CETIRIZINE HCL 10 MG PO TABS: 10.0000 mg | ORAL_TABLET | Freq: Every day | ORAL | 11 refills | Status: DC

## 2018-02-20 ENCOUNTER — Telehealth: Payer: Self-pay

## 2018-02-20 NOTE — Telephone Encounter (Signed)
Mailed patient records to [facility] 01/04/2017--02/20/2018 [facility]

## 2018-03-06 DIAGNOSIS — Z0289 Encounter for other administrative examinations: Secondary | ICD-10-CM

## 2018-03-14 IMAGING — MR MR LUMBAR SPINE W/O CM
4 of 5 series · 15 of 48 positions shown · non-contrast
Comparison: Plain films lumbar spine 07/23/2013. MRI lumbar spine
10/12/2007.

CLINICAL DATA: Low back and left hip pain, chronic. No known
injury.

EXAM:
MRI LUMBAR SPINE WITHOUT CONTRAST
TECHNIQUE: Multiplanar, multisequence MR imaging of the lumbar spine was
performed. No intravenous contrast was administered.

[Series 2: T2 · sagittal · 4.0mm · 0.44mm/px · 6 of 17 slices shown (1 of 2)]
[im 1/17]
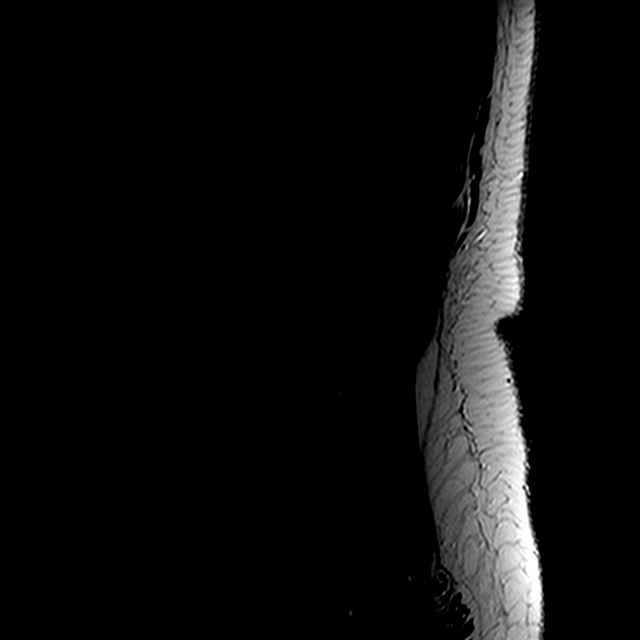
[im 4/17]
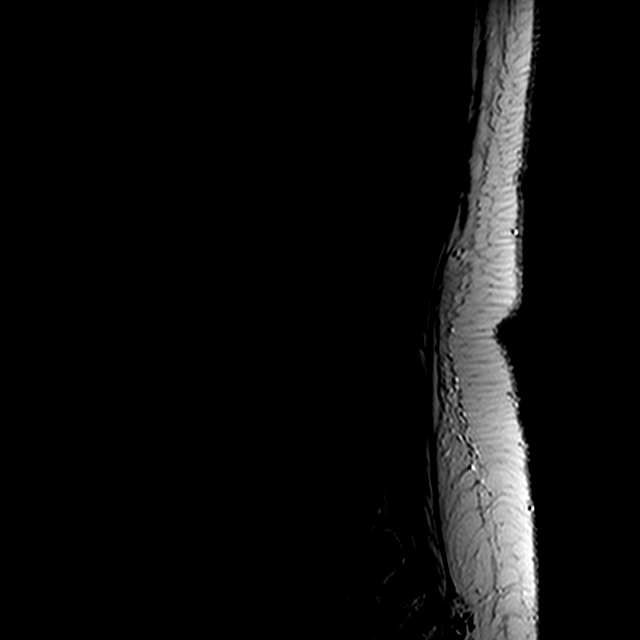
[im 7/17]
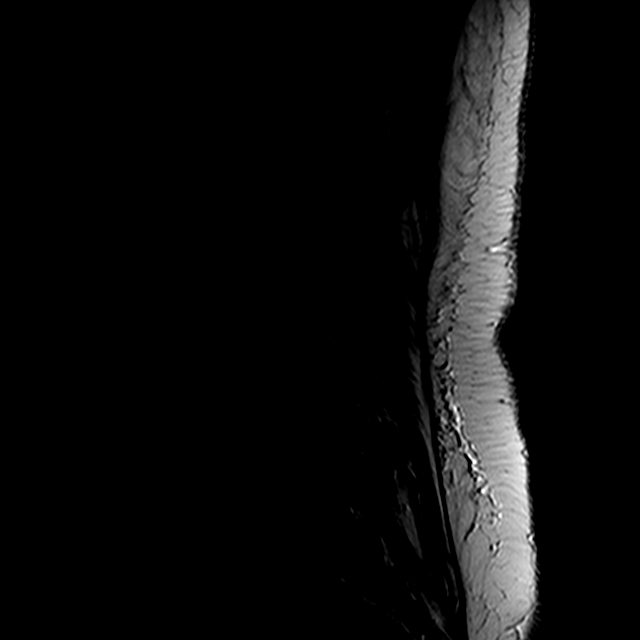
[im 10/17]
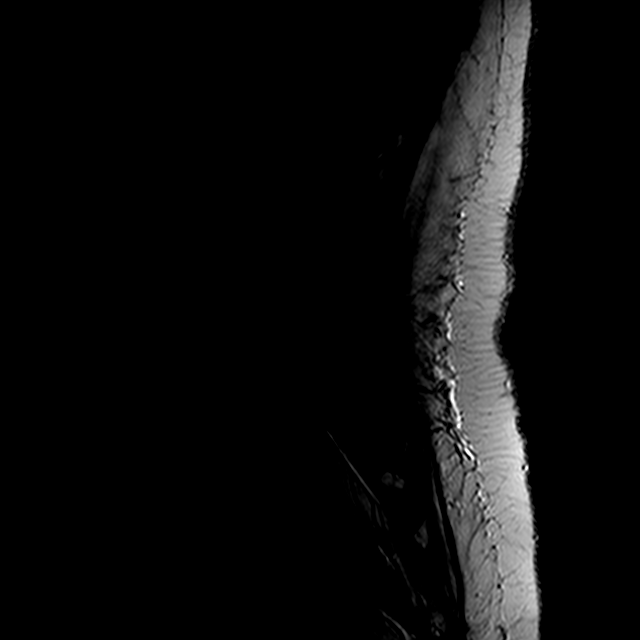
[im 13/17]
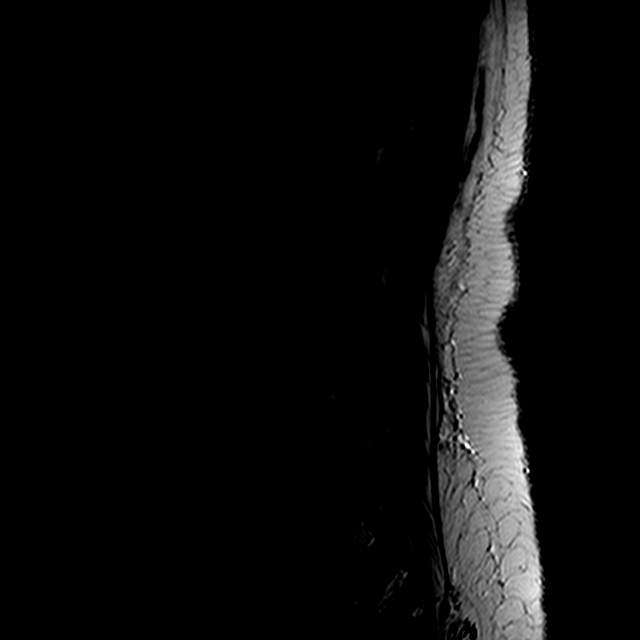
[im 17/17]
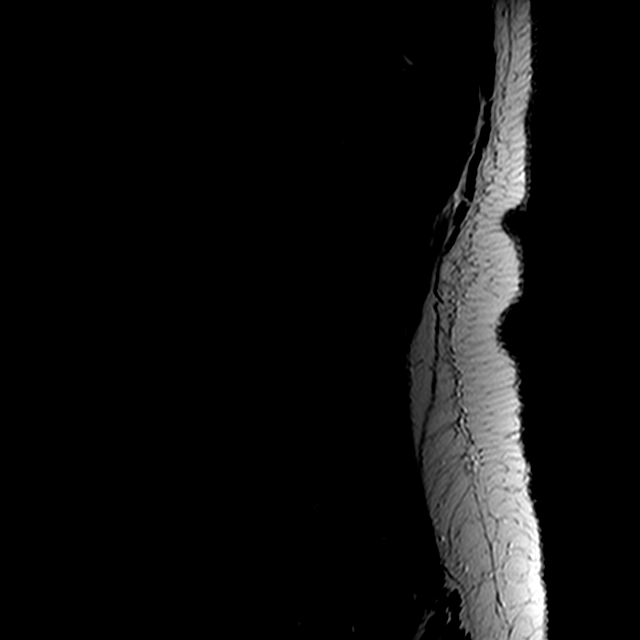

[Series 3: T1 · sagittal · 4.0mm · 0.44mm/px · 3 of 17 slices shown (1 of 2)]
[im 4/17]
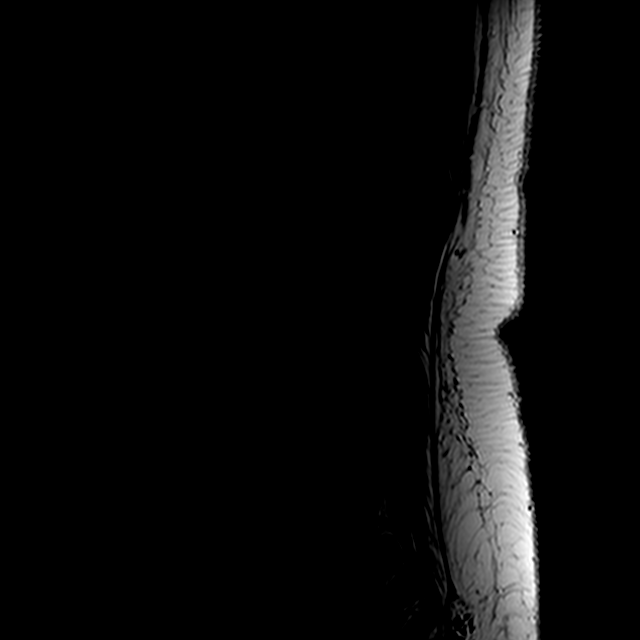
[im 10/17]
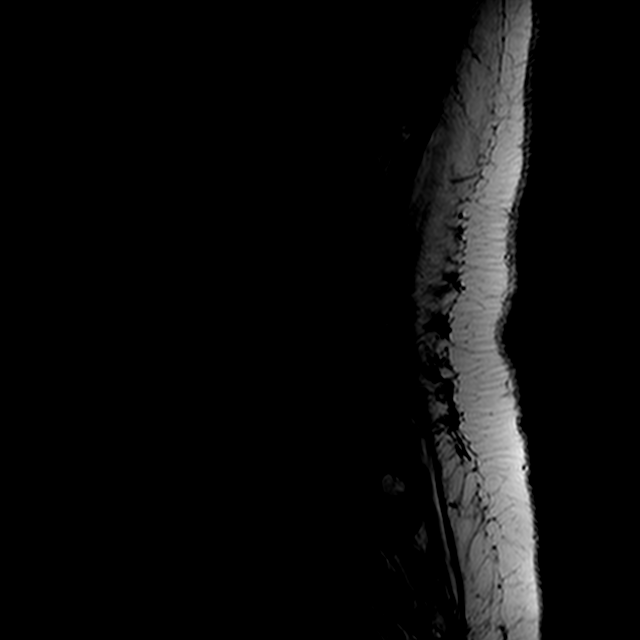
[im 17/17]
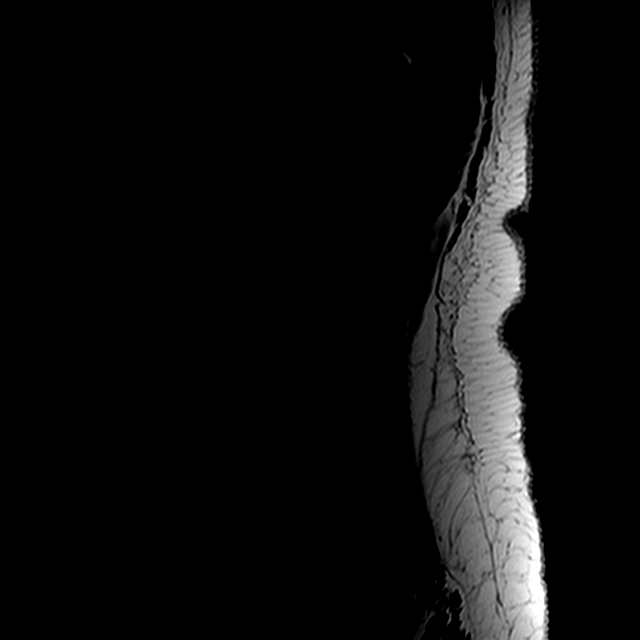

[Series 5: T2 · axial · 4.0mm · 0.39mm/px · z∈[-61,+124]mm · 3 of 39 slices shown (2 of 2)]
[im 6/39]
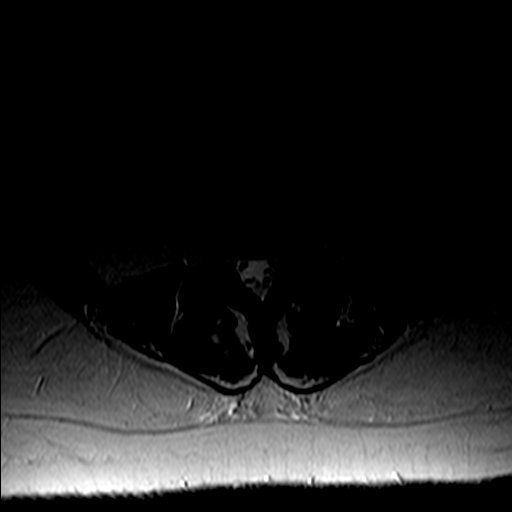
[im 20/39]
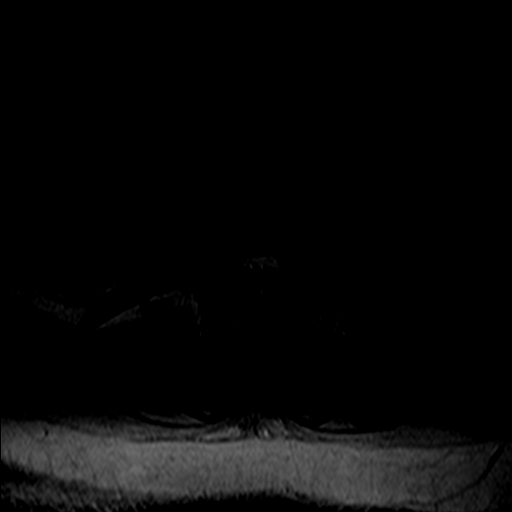
[im 33/39]
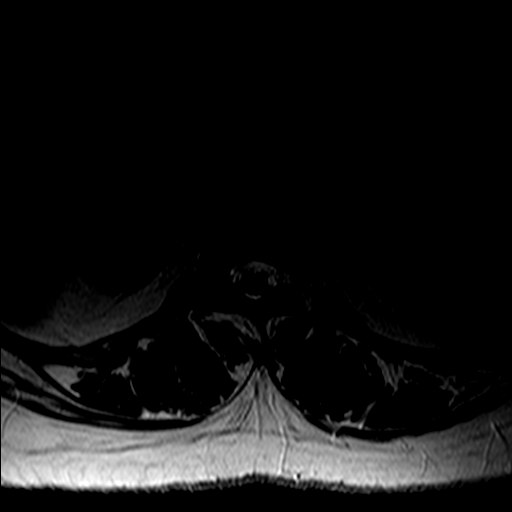

[Series 6: T1 · axial · 4.0mm · 0.39mm/px · z∈[-61,+124]mm · 3 of 39 slices shown (2 of 2)]
[im 6/39]
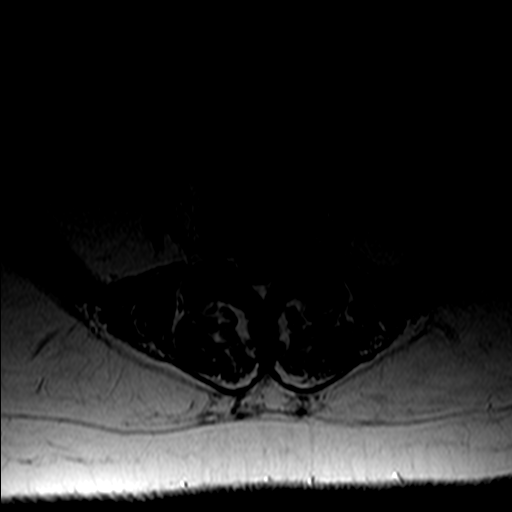
[im 20/39]
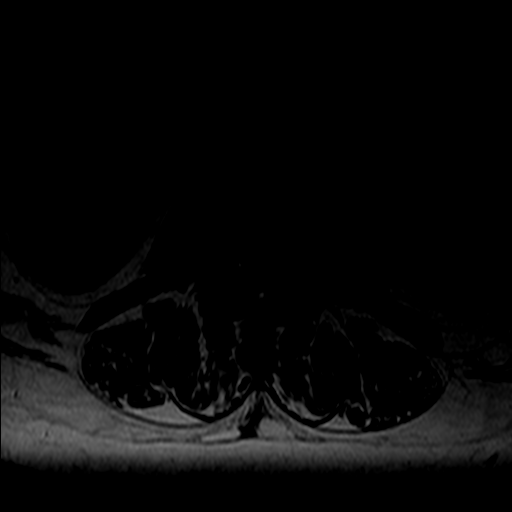
[im 33/39]
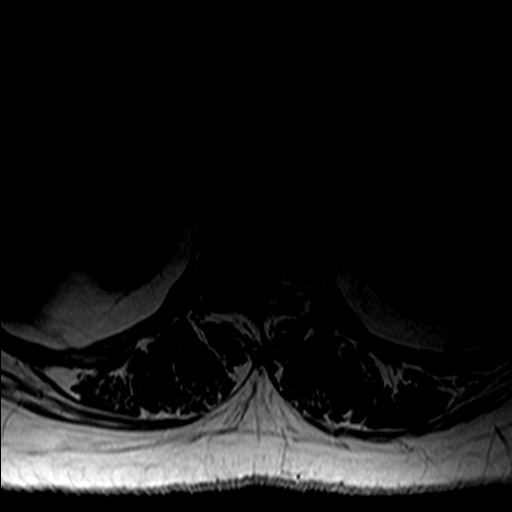

[15 of 48 positions shown; findings below may reference images not displayed]

FINDINGS: Segmentation:  Standard.

Alignment:  Normal.

Vertebrae: Height is maintained. Hemangioma in L2 is noted.
Degenerative endplate signal change in the lower thoracic spine is
also noted.

Conus medullaris: Extends to the L1 level and appears normal.

Paraspinal and other soft tissues: Unremarkable.

Disc levels:

T9-10 and T10-11 are imaged in the sagittal plane only. Disc bulging
at these levels appears to efface the ventral thecal sac but the
central canal and foramina appear open.

T11-12:  Disc bulge without central canal or foraminal stenosis.

T12-L1:  Negative.

L1-2: Shallow right paracentral protrusion without central canal or
foraminal stenosis, unchanged.

L2-3: Mild disc bulge slightly eccentric to the right without
central canal or foraminal stenosis.

L3-4: Mild disc bulge and facet degenerative disease without central
canal or foraminal stenosis, unchanged.

L4-5: Facet degenerative disease and a shallow disc bulge are seen.
The central canal and foramina are open. The appearance is
unchanged.

L5-S1: Bilateral facet degenerative disease. No disc bulge or
protrusion. The central canal and foramina are open. The appearance
is unchanged.
IMPRESSION: No change in the appearance of mild lumbar degenerative disease
without central canal or foraminal narrowing.

## 2018-03-14 IMAGING — CR DG KNEE COMPLETE 4+V*R*
1 series · 4 of 4 positions shown · non-contrast
Comparison: None.

CLINICAL DATA: Right knee pain for 1 month, no known injury,
initial encounter

EXAM:
RIGHT KNEE - COMPLETE 4+ VIEW

[Series 1: dg knee complete 4 views right · 0.14mm/px · 4 of 4 slices shown]
[im 1/4]
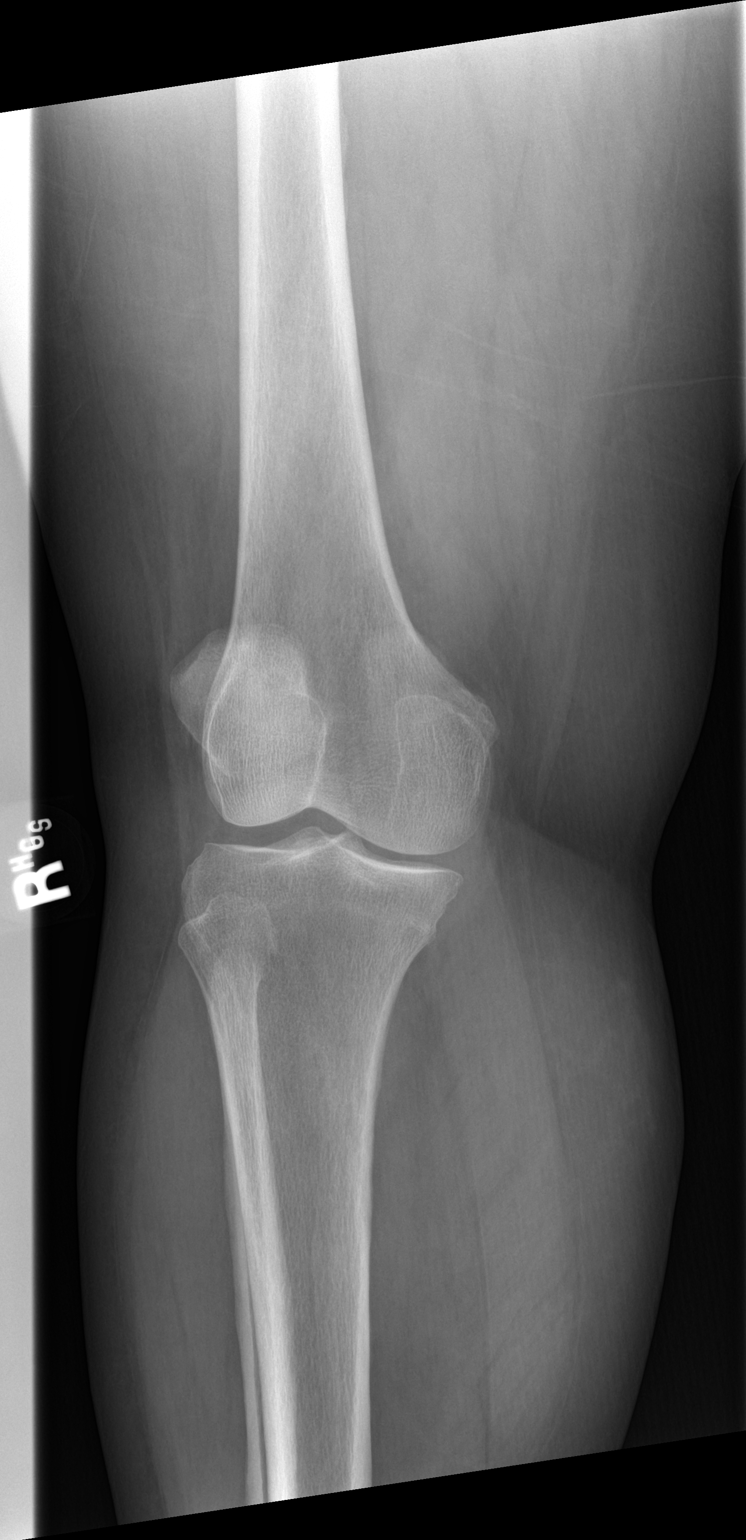
[im 2/4]
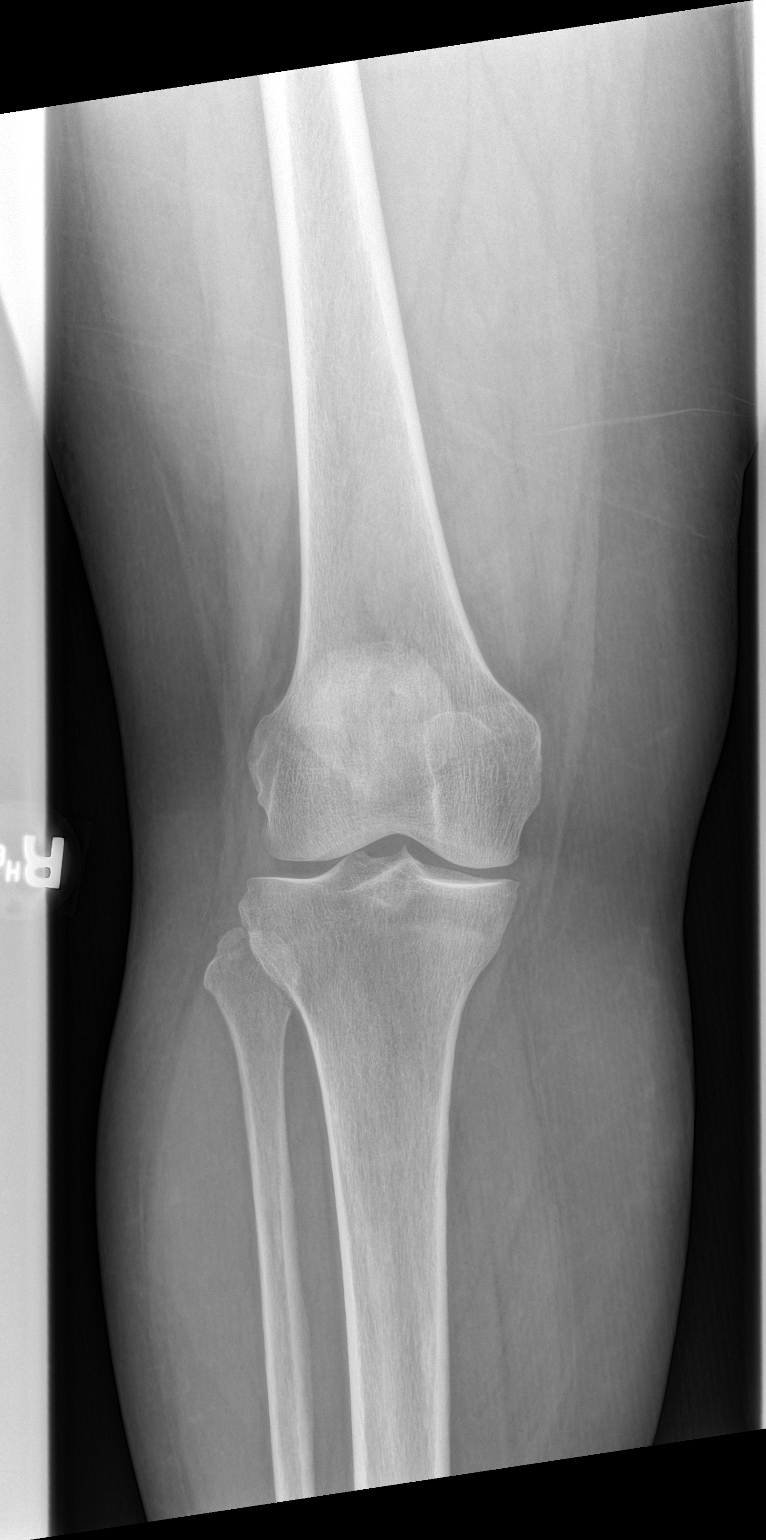
[im 3/4]
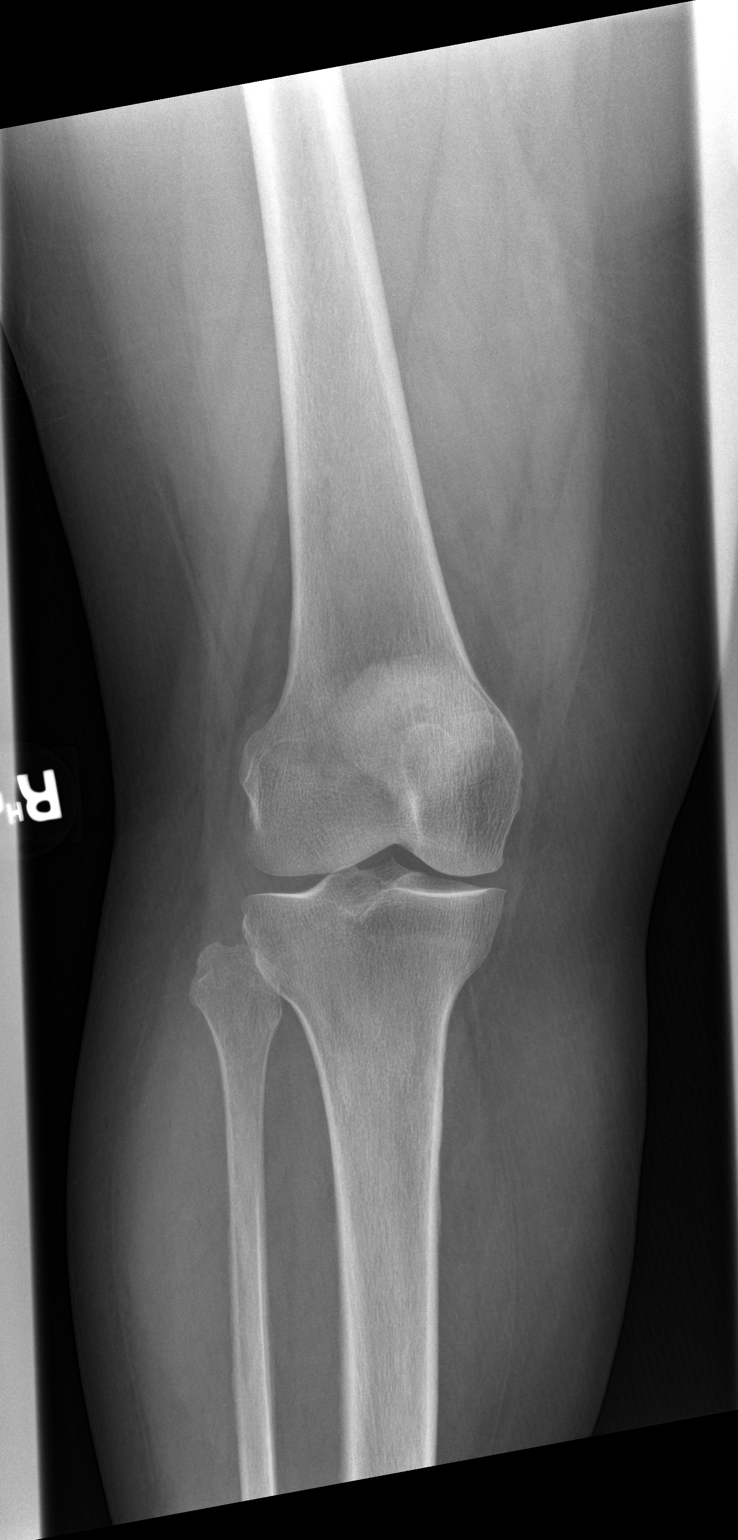
[im 4/4]
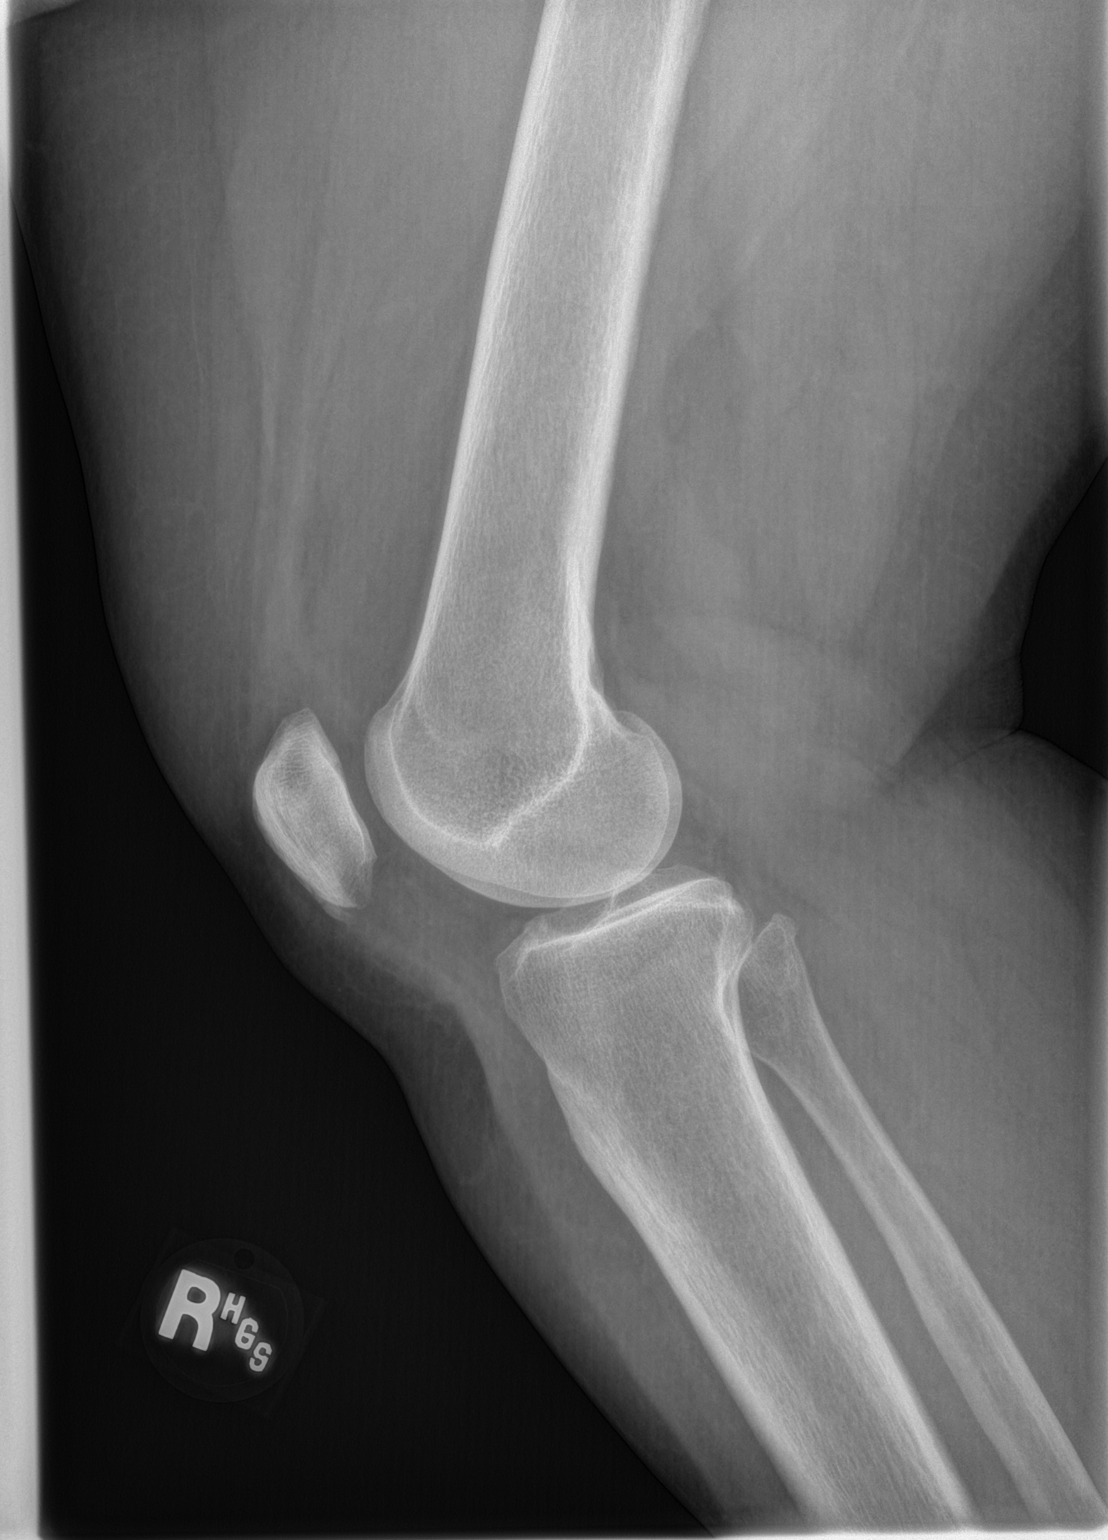

[4 of 4 positions shown; findings below may reference images not displayed]

FINDINGS: Minimal joint space narrowing is noted medially. No acute fracture
or dislocation is seen. No soft tissue abnormality is noted.
IMPRESSION: Minimal degenerative change without acute abnormality.

## 2018-03-31 ENCOUNTER — Ambulatory Visit (INDEPENDENT_AMBULATORY_CARE_PROVIDER_SITE_OTHER): Payer: BLUE CROSS/BLUE SHIELD | Admitting: Nurse Practitioner

## 2018-03-31 ENCOUNTER — Encounter: Payer: Self-pay | Admitting: Nurse Practitioner

## 2018-03-31 ENCOUNTER — Other Ambulatory Visit: Payer: Self-pay

## 2018-03-31 VITALS — BP 146/85 | HR 68 | Temp 98.4°F | Resp 16 | Ht 60.0 in | Wt 204.4 lb

## 2018-03-31 DIAGNOSIS — J029 Acute pharyngitis, unspecified: Secondary | ICD-10-CM | POA: Diagnosis not present

## 2018-03-31 LAB — POCT RAPID STREP A (OFFICE): Rapid Strep A Screen: NEGATIVE

## 2018-03-31 MED ORDER — AZITHROMYCIN 250 MG PO TABS: ORAL_TABLET | ORAL | 0 refills | Status: DC

## 2018-03-31 NOTE — Progress Notes (Signed)
[facility] [place],  [address]  Internal MEDICINE  Office Visit Note  Patient Name: Jamie Powers  [id number]  [id number]  Date of Service: 04/02/2018   Pt is here for a sick visi   Chief Complaint  Patient presents with  . Sore Throat    been going on for 4days, has not been around anyone sick, no fever or chills, no body aches  . Cough    phlemn coming up has a greenish tint to it. no headache  . Pain    hip and leg pain     The patient states that she has sore throat, cough, and post nasal drip. Started about four days ago. States that she has taken tylenol once and has been using throat lozenges. Denies fever, chills, headaches, or body aches. She is unaware of any positive contact with COVID 19.       Current Medication:  Outpatient Encounter Medications as of 03/31/2018  Medication Sig  . AFLURIA QUADRIVALENT 0.5 ML injection TO BE ADMINISTERED BY PHARMACIST FOR IMMUNIZATION  . aspirin EC 81 MG tablet Take 81 mg by mouth daily.  Marland Kitchen azithromycin (ZITHROMAX) 250 MG tablet z-pack - take as directed for 5 days  . cetirizine (ZYRTEC) 10 MG tablet Take 1 tablet (10 mg total) by mouth daily.  . colchicine 0.6 MG tablet Take 2 tablets initially, and then one tablet an hour later.  Wait three days and repeat dosing as needed for flares  . diclofenac sodium (VOLTAREN) 1 % GEL Apply 4 g topically 4 (four) times daily.  . Fluoxetine HCl, PMDD, 10 MG TABS TAKE 1 TABLET BY MOUTH EVERY DAY  . hydrocortisone 2.5 % cream Apply 1 application topically as needed.  Marland Kitchen losartan (COZAAR) 50 MG tablet Take 1 tablet (50 mg total) by mouth daily.  Marland Kitchen nystatin cream (MYCOSTATIN) APPLY A SMALL AMOUNT TO THE AFFECTED AREA 2-3 TIMES A DAY  . omeprazole (PRILOSEC) 40 MG capsule TAKE 1 CAPSULE BY MOUTH EVERY DAY   No facility-administered encounter medications on file as of 03/31/2018.       Medical History: Past Medical History:  Diagnosis Date  .  Hypertension   . Obesity   . Protein in urine   . Sleep apnea     Today's Vitals   03/31/18 1108  BP: (!) 146/85  Pulse: 68  Resp: 16  Temp: 98.4 F (36.9 C)  SpO2: 97%  Weight: 204 lb 6.4 oz (92.7 kg)  Height: 5' (1.524 m)   Body mass index is 39.92 kg/m.  Review of Systems  Constitutional: Positive for chills and fatigue. Negative for fever.  HENT: Positive for ear pain, postnasal drip, rhinorrhea, sore throat and voice change. Negative for congestion and sinus pain.   Respiratory: Negative for cough and wheezing.   Cardiovascular: Negative for chest pain and palpitations.  Gastrointestinal: Negative.   Musculoskeletal: Positive for myalgias.  Skin: Negative.   Allergic/Immunologic: Positive for environmental allergies.  Neurological: Positive for headaches.  Hematological: Positive for adenopathy.    Physical Exam Constitutional:      General: She is not in acute distress.    Appearance: Normal appearance. She is well-developed. She is not diaphoretic.  HENT:     Head: Normocephalic and atraumatic.     Right Ear: Tympanic membrane is bulging.     Left Ear: Tympanic membrane is bulging.     Nose: Congestion present.     Mouth/Throat:     Pharynx: Posterior oropharyngeal  erythema present. No oropharyngeal exudate.  Eyes:     Pupils: Pupils are equal, round, and reactive to light.  Neck:     Musculoskeletal: Normal range of motion and neck supple.     Thyroid: No thyromegaly.     Vascular: No JVD.     Trachea: No tracheal deviation.  Cardiovascular:     Rate and Rhythm: Normal rate and regular rhythm.     Heart sounds: Normal heart sounds. No murmur. No friction rub. No gallop.   Pulmonary:     Effort: Pulmonary effort is normal. No respiratory distress.     Breath sounds: No wheezing or rales.  Chest:     Chest wall: No tenderness.  Abdominal:     General: Bowel sounds are normal.     Palpations: Abdomen is soft.  Musculoskeletal: Normal range of motion.   Lymphadenopathy:     Cervical: No cervical adenopathy.  Skin:    General: Skin is warm and dry.  Neurological:     Mental Status: She is alert and oriented to person, place, and time.     Cranial Nerves: No cranial nerve deficit.  Psychiatric:        Behavior: Behavior normal.        Thought Content: Thought content normal.        Judgment: Judgment normal.   Assessment/Plan: 1. Acute pharyngitis, unspecified etiology Rapid strep is negative. Treat with z-pack. Take as directed for 5 days. Rest and increase fluids. Take OTC medication as needed and as indicated for acute symptoms.  - azithromycin (ZITHROMAX) 250 MG tablet; z-pack - take as directed for 5 days  Dispense: 6 tablet; Refill: 0  2. Sore throat Rapid strep negative. Gargle with warm salt water as needed to reduce sore throat.  - POCT rapid strep A  General Counseling: Jamie Powers verbalizes understanding of the findings of todays visit and agrees with plan of treatment. I have discussed any further diagnostic evaluation that may be needed or ordered today. We also reviewed her medications today. she has been encouraged to call the office with any questions or concerns that should arise related to todays visit.    Counseling:  Rest and increase fluids. Continue using OTC medication to control symptoms.   This patient was seen by Leretha Pol FNP Collaboration with Dr Lavera Guise as a part of collaborative care agreement  Orders Placed This Encounter  Procedures  . POCT rapid strep A    Meds ordered this encounter  Medications  . azithromycin (ZITHROMAX) 250 MG tablet    Sig: z-pack - take as directed for 5 days    Dispense:  6 tablet    Refill:  0    Order Specific Question:   Supervising Provider    Answer:   Lavera Guise [[id number]]    Time spent: 25 Minutes

## 2018-04-02 DIAGNOSIS — J029 Acute pharyngitis, unspecified: Secondary | ICD-10-CM | POA: Insufficient documentation

## 2018-05-22 ENCOUNTER — Encounter: Payer: Self-pay | Admitting: Nurse Practitioner

## 2018-05-22 ENCOUNTER — Ambulatory Visit (INDEPENDENT_AMBULATORY_CARE_PROVIDER_SITE_OTHER): Payer: BLUE CROSS/BLUE SHIELD | Admitting: Nurse Practitioner

## 2018-05-22 ENCOUNTER — Other Ambulatory Visit: Payer: Self-pay | Admitting: Nurse Practitioner

## 2018-05-22 ENCOUNTER — Other Ambulatory Visit: Payer: Self-pay

## 2018-05-22 VITALS — BP 142/87 | HR 81 | Resp 16 | Ht 60.0 in | Wt 207.0 lb

## 2018-05-22 DIAGNOSIS — Z124 Encounter for screening for malignant neoplasm of cervix: Secondary | ICD-10-CM

## 2018-05-22 DIAGNOSIS — M25561 Pain in right knee: Secondary | ICD-10-CM | POA: Diagnosis not present

## 2018-05-22 DIAGNOSIS — Z0001 Encounter for general adult medical examination with abnormal findings: Secondary | ICD-10-CM | POA: Diagnosis not present

## 2018-05-22 DIAGNOSIS — R300 Dysuria: Secondary | ICD-10-CM | POA: Insufficient documentation

## 2018-05-22 DIAGNOSIS — I10 Essential (primary) hypertension: Secondary | ICD-10-CM

## 2018-05-22 DIAGNOSIS — B372 Candidiasis of skin and nail: Secondary | ICD-10-CM

## 2018-05-22 DIAGNOSIS — M5116 Intervertebral disc disorders with radiculopathy, lumbar region: Secondary | ICD-10-CM | POA: Diagnosis not present

## 2018-05-22 MED ORDER — DICLOFENAC SODIUM 1 % TD GEL: 4.0000 g | Freq: Four times a day (QID) | TRANSDERMAL | 3 refills | Status: DC

## 2018-05-22 MED ORDER — NYSTATIN 100000 UNIT/GM EX CREA: TOPICAL_CREAM | Freq: Three times a day (TID) | CUTANEOUS | 2 refills | Status: DC

## 2018-05-22 NOTE — Progress Notes (Signed)
[facility] [place], [place] [address]  Internal MEDICINE  Office Visit Note  Patient Name: Jamie Powers  [id number]  [id number]  Date of Service: 05/22/2018   Pt is here for routine health maintenance examination   Chief Complaint  Patient presents with  . Annual Exam  . Hypertension  . Leg Pain    left leg and lower back left side     The patient is here for health maintenance exam and pap smear. She is complaining of right knee pain. Feels "loose" when going up and down stairs. Feels like it is going to give out on her. She also has some low back pain which radiates into the left hip. This is worse with exertion.she is due to have routine, fasting labs done.    Current Medication: Outpatient Encounter Medications as of 05/22/2018  Medication Sig  . AFLURIA QUADRIVALENT 0.5 ML injection TO BE ADMINISTERED BY PHARMACIST FOR IMMUNIZATION  . aspirin EC 81 MG tablet Take 81 mg by mouth daily.  . cetirizine (ZYRTEC) 10 MG tablet Take 1 tablet (10 mg total) by mouth daily.  . colchicine 0.6 MG tablet Take 2 tablets initially, and then one tablet an hour later.  Wait three days and repeat dosing as needed for flares  . diclofenac sodium (VOLTAREN) 1 % GEL Apply 4 g topically 4 (four) times daily.  . Fluoxetine HCl, PMDD, 10 MG TABS TAKE 1 TABLET BY MOUTH EVERY DAY  . hydrocortisone 2.5 % cream Apply 1 application topically as needed.  Marland Kitchen losartan (COZAAR) 50 MG tablet Take 1 tablet (50 mg total) by mouth daily.  Marland Kitchen nystatin cream (MYCOSTATIN) Apply topically 3 (three) times daily.  Marland Kitchen omeprazole (PRILOSEC) 40 MG capsule TAKE 1 CAPSULE BY MOUTH EVERY DAY  . [DISCONTINUED] diclofenac sodium (VOLTAREN) 1 % GEL Apply 4 g topically 4 (four) times daily.  . [DISCONTINUED] nystatin cream (MYCOSTATIN) APPLY A SMALL AMOUNT TO THE AFFECTED AREA 2-3 TIMES A DAY  . [DISCONTINUED] azithromycin (ZITHROMAX) 250 MG tablet z-pack - take as directed for 5 days (Patient not  taking: Reported on 05/22/2018)   No facility-administered encounter medications on file as of 05/22/2018.     Surgical History: Past Surgical History:  Procedure Laterality Date  . BREAST BIOPSY Left 2000   neg  . BREAST SURGERY    . CHOLECYSTECTOMY    . COLONOSCOPY WITH PROPOFOL N/A 01/31/2015   Procedure: COLONOSCOPY WITH PROPOFOL;  Surgeon: Lollie Sails, MD;  Location: [facility] ENDOSCOPY;  Service: Endoscopy;  Laterality: N/A;  . TONSILLECTOMY      Medical History: Past Medical History:  Diagnosis Date  . Hypertension   . Obesity   . Protein in urine   . Sleep apnea     Family History: Family History  Problem Relation Age of Onset  . Breast cancer Paternal Aunt   . Hypertension Mother   . Heart disease Father       Review of Systems  Constitutional: Negative for activity change, chills, fatigue and unexpected weight change.  HENT: Negative for congestion, postnasal drip, rhinorrhea, sneezing and sore throat.   Respiratory: Negative for cough, chest tightness, shortness of breath and wheezing.   Cardiovascular: Negative for chest pain and palpitations.  Gastrointestinal: Negative for abdominal pain, constipation, diarrhea, nausea and vomiting.  Endocrine: Negative for cold intolerance, heat intolerance, polydipsia and polyuria.       Hot flashes.improved some on low dose fluoxetine.   Genitourinary: Negative for dysuria, frequency and urgency.  Musculoskeletal: Positive for arthralgias. Negative for back pain, joint swelling and neck pain.       Right knee pain. Low back pain which radiates to the left hip and down the left leg.   Skin: Negative for rash.  Allergic/Immunologic: Negative for environmental allergies.  Neurological: Negative for dizziness, tremors, numbness and headaches.  Hematological: Negative for adenopathy. Does not bruise/bleed easily.  Psychiatric/Behavioral: Negative for behavioral problems (Depression), dysphoric mood, sleep disturbance and  suicidal ideas. The patient is nervous/anxious.      Today's Vitals   05/22/18 0958  BP: (!) 142/87  Pulse: 81  Resp: 16  SpO2: 97%  Weight: 207 lb (93.9 kg)  Height: 5' (1.524 m)   Body mass index is 40.43 kg/m.   Physical Exam Constitutional:      General: She is not in acute distress.    Appearance: Normal appearance. She is well-developed. She is not diaphoretic.  HENT:     Head: Normocephalic and atraumatic.     Right Ear: Tympanic membrane is bulging.     Left Ear: Tympanic membrane is bulging.     Mouth/Throat:     Pharynx: No oropharyngeal exudate.  Eyes:     Conjunctiva/sclera: Conjunctivae normal.     Pupils: Pupils are equal, round, and reactive to light.  Neck:     Musculoskeletal: Normal range of motion and neck supple.     Thyroid: No thyromegaly.     Vascular: No carotid bruit or JVD.     Trachea: No tracheal deviation.  Cardiovascular:     Rate and Rhythm: Normal rate and regular rhythm.     Pulses: Normal pulses.          Dorsalis pedis pulses are 2+ on the right side and 2+ on the left side.       Posterior tibial pulses are 2+ on the right side and 2+ on the left side.     Heart sounds: Normal heart sounds. No murmur. No friction rub. No gallop.   Pulmonary:     Effort: Pulmonary effort is normal. No respiratory distress.     Breath sounds: Normal breath sounds. No wheezing or rales.  Chest:     Chest wall: No tenderness.     Breasts:        Right: Normal. No swelling, bleeding, inverted nipple, mass, nipple discharge, skin change or tenderness.        Left: Normal. No swelling, bleeding, inverted nipple, mass, nipple discharge, skin change or tenderness.  Abdominal:     General: Bowel sounds are normal.     Palpations: Abdomen is soft.     Tenderness: There is no abdominal tenderness.  Genitourinary:    General: Normal vulva.     Exam position: Supine.     Labia:        Right: No rash or tenderness.        Left: No rash or tenderness.       Vagina: Normal. No vaginal discharge, erythema, tenderness or bleeding.     Cervix: No cervical motion tenderness, discharge, friability or erythema.     Uterus: Normal.      Adnexa: Right adnexa normal and left adnexa normal.  Musculoskeletal: Normal range of motion.        General: Tenderness present.       Legs:     Right foot: Normal range of motion. No deformity.     Left foot: Normal range of motion. No deformity.  Feet:  Right foot:     Protective Sensation: 10 sites tested. 10 sites sensed.     Skin integrity: Skin integrity normal.     Toenail Condition: Right toenails are normal.     Left foot:     Protective Sensation: 10 sites tested. 10 sites sensed.     Skin integrity: Skin integrity normal.     Toenail Condition: Left toenails are normal.  Lymphadenopathy:     Cervical: No cervical adenopathy.  Skin:    General: Skin is warm and dry.  Neurological:     Mental Status: She is alert and oriented to person, place, and time.     Cranial Nerves: No cranial nerve deficit.  Psychiatric:        Behavior: Behavior normal.        Thought Content: Thought content normal.        Judgment: Judgment normal.    LABS: Recent Results (from the past 2160 hour(s))  POCT rapid strep A     Status: None   Collection Time: 03/31/18 11:31 AM  Result Value Ref Range   Rapid Strep A Screen Negative Negative   Assessment/Plan: 1. Encounter for general adult medical examination with abnormal findings Annual health maintenance exam today  2. Essential hypertension Stable. Continue bp medication as prescribed   3. Pain in joint of right knee Get new x-ray of right knee for further evaluation. Refer to orthopedics as indicated. May use diclofenac get up to four times daily as needed to reduce pain and inflammation.  - DG Knee Complete 4 Views Right; Future - diclofenac sodium (VOLTAREN) 1 % GEL; Apply 4 g topically 4 (four) times daily.  Dispense: 100 g; Refill: 3  4. Lumbar  disc disease with radiculopathy Refer to physical therapy for further evaluation and treatment. This has helped her low back pain in the past.  - Ambulatory referral to Physical Therapy  5. Cutaneous candidiasis Fungal rash right antecubital fossa. Nystatin cream may be applied TID as needed  - nystatin cream (MYCOSTATIN); Apply topically 3 (three) times daily.  Dispense: 30 g; Refill: 2  6. Routine cervical smear - Pap IG and HPV (high risk) DNA detection  7. Dysuria - Urinalysis, Routine w reflex microscopic  General Counseling: Mikaila verbalizes understanding of the findings of todays visit and agrees with plan of treatment. I have discussed any further diagnostic evaluation that may be needed or ordered today. We also reviewed her medications today. she has been encouraged to call the office with any questions or concerns that should arise related to todays visit.    Counseling:  Hypertension Counseling:   The following hypertensive lifestyle modification were recommended and discussed:  1. Limiting alcohol intake to less than 1 oz/day of ethanol:(24 oz of beer or 8 oz of wine or 2 oz of 100-proof whiskey). 2. Take baby ASA 81 mg daily. 3. Importance of regular aerobic exercise and losing weight. 4. Reduce dietary saturated fat and cholesterol intake for overall cardiovascular health. 5. Maintaining adequate dietary potassium, calcium, and magnesium intake. 6. Regular monitoring of the blood pressure. 7. Reduce sodium intake to less than 100 mmol/day (less than 2.3 gm of sodium or less than 6 gm of sodium choride)    This patient was seen by [place] with Dr Lavera Guise as a part of collaborative care agreement   Orders Placed This Encounter  Procedures  . DG Knee Complete 4 Views Right  . Urinalysis, Routine w reflex microscopic  .  Ambulatory referral to Physical Therapy    Meds ordered this encounter  Medications  . diclofenac sodium  (VOLTAREN) 1 % GEL    Sig: Apply 4 g topically 4 (four) times daily.    Dispense:  100 g    Refill:  3    Order Specific Question:   Supervising Provider    Answer:   Lavera Guise [[id number]]  . nystatin cream (MYCOSTATIN)    Sig: Apply topically 3 (three) times daily.    Dispense:  30 g    Refill:  2    Order Specific Question:   Supervising Provider    Answer:   Lavera Guise [[id number]]    Time spent: [place], MD  Internal Medicine

## 2018-05-23 LAB — CBC
Hematocrit: 38.8 % (ref 34.0–46.6)
Hemoglobin: 12.8 g/dL (ref 11.1–15.9)
MCH: 30 pg (ref 26.6–33.0)
MCHC: 33 g/dL (ref 31.5–35.7)
MCV: 91 fL (ref 79–97)
Platelets: 346 10*3/uL (ref 150–450)
RBC: 4.26 x10E6/uL (ref 3.77–5.28)
RDW: 12.8 % (ref 11.7–15.4)
WBC: 6.7 10*3/uL (ref 3.4–10.8)

## 2018-05-23 LAB — URINALYSIS, ROUTINE W REFLEX MICROSCOPIC
Bilirubin, UA: NEGATIVE
Glucose, UA: NEGATIVE
Ketones, UA: NEGATIVE
Leukocytes,UA: NEGATIVE
Nitrite, UA: NEGATIVE
RBC, UA: NEGATIVE
Specific Gravity, UA: 1.027 (ref 1.005–1.030)
Urobilinogen, Ur: 0.2 mg/dL (ref 0.2–1.0)
pH, UA: 5.5 (ref 5.0–7.5)

## 2018-05-23 LAB — T3: T3, Total: 130 ng/dL (ref 71–180)

## 2018-05-23 LAB — T4, FREE: Free T4: 1.08 ng/dL (ref 0.82–1.77)

## 2018-05-23 LAB — COMPREHENSIVE METABOLIC PANEL
ALT: 18 IU/L (ref 0–32)
AST: 18 IU/L (ref 0–40)
Albumin/Globulin Ratio: 1.6 (ref 1.2–2.2)
Albumin: 4.2 g/dL (ref 3.8–4.8)
Alkaline Phosphatase: 108 IU/L (ref 39–117)
BUN/Creatinine Ratio: 24 (ref 12–28)
BUN: 16 mg/dL (ref 8–27)
Bilirubin Total: 0.3 mg/dL (ref 0.0–1.2)
CO2: 26 mmol/L (ref 20–29)
Calcium: 9.1 mg/dL (ref 8.7–10.3)
Chloride: 103 mmol/L (ref 96–106)
Creatinine, Ser: 0.68 mg/dL (ref 0.57–1.00)
GFR calc Af Amer: 107 mL/min/{1.73_m2} (ref >59)
GFR calc non Af Amer: 93 mL/min/{1.73_m2} (ref >59)
Globulin, Total: 2.7 g/dL (ref 1.5–4.5)
Glucose: 98 mg/dL (ref 65–99)
Potassium: 4.5 mmol/L (ref 3.5–5.2)
Sodium: 141 mmol/L (ref 134–144)
Total Protein: 6.9 g/dL (ref 6.0–8.5)

## 2018-05-23 LAB — LIPID PANEL W/O CHOL/HDL RATIO
Cholesterol, Total: 168 mg/dL (ref 100–199)
HDL: 58 mg/dL (ref >39)
LDL Calculated: 100 mg/dL — ABNORMAL HIGH (ref 0–99)
Triglycerides: 51 mg/dL (ref 0–149)
VLDL Cholesterol Cal: 10 mg/dL (ref 5–40)

## 2018-05-23 LAB — MICROSCOPIC EXAMINATION: Casts: NONE SEEN /lpf

## 2018-05-23 LAB — TSH: TSH: 1.58 u[IU]/mL (ref 0.450–4.500)

## 2018-05-23 LAB — VITAMIN D 25 HYDROXY (VIT D DEFICIENCY, FRACTURES): Vit D, 25-Hydroxy: 16.6 ng/mL — ABNORMAL LOW (ref 30.0–100.0)

## 2018-05-24 ENCOUNTER — Other Ambulatory Visit: Payer: Self-pay | Admitting: Nurse Practitioner

## 2018-05-24 ENCOUNTER — Telehealth: Payer: Self-pay

## 2018-05-24 DIAGNOSIS — E559 Vitamin D deficiency, unspecified: Secondary | ICD-10-CM

## 2018-05-24 MED ORDER — ERGOCALCIFEROL 1.25 MG (50000 UT) PO CAPS: 50000.0000 [IU] | ORAL_CAPSULE | ORAL | 5 refills | Status: DC

## 2018-05-24 NOTE — Progress Notes (Signed)
Vitamin d low. Add drisdol 50000iu weekly for next few months. Other labs good.

## 2018-05-24 NOTE — Telephone Encounter (Signed)
Informed pt of lab results and new ex sent to pharmacy.

## 2018-05-24 NOTE — Telephone Encounter (Signed)
-----   Message from Ronnell Freshwater, NP sent at 05/24/2018  8:46 AM EDT ----- Please let the patient know that Vitamin d low. Add drisdol 50000iu weekly for next few months. Other labs good. thanks

## 2018-05-30 ENCOUNTER — Other Ambulatory Visit: Payer: Self-pay

## 2018-05-30 ENCOUNTER — Ambulatory Visit: Payer: BLUE CROSS/BLUE SHIELD | Attending: Nurse Practitioner

## 2018-05-30 DIAGNOSIS — M25652 Stiffness of left hip, not elsewhere classified: Secondary | ICD-10-CM | POA: Diagnosis present

## 2018-05-30 DIAGNOSIS — G8929 Other chronic pain: Secondary | ICD-10-CM | POA: Diagnosis present

## 2018-05-30 DIAGNOSIS — M545 Low back pain, unspecified: Secondary | ICD-10-CM

## 2018-05-30 DIAGNOSIS — M25552 Pain in left hip: Secondary | ICD-10-CM | POA: Diagnosis present

## 2018-05-30 NOTE — Therapy (Deleted)
[facility] [address] [place], [address] Phone: [phone]   Fax:  [phone]  Physical Therapy Treatment  Patient Details  Name: Jamie Powers MRN: [id number] Date of Birth: January 14, 1955 Referring Provider (PT): Humphrey Rolls MD   Encounter Date: 05/30/2018  PT End of Session - 05/30/18 1225    Visit Number  1    Number of Visits  13    Date for PT Re-Evaluation  07/11/18    PT Start Time  1000    PT Stop Time  1045    PT Time Calculation (min)  45 min       Past Medical History:  Diagnosis Date  . Hypertension   . Obesity   . Protein in urine   . Sleep apnea     Past Surgical History:  Procedure Laterality Date  . BREAST BIOPSY Left 2000   neg  . BREAST SURGERY    . CHOLECYSTECTOMY    . COLONOSCOPY WITH PROPOFOL N/A 01/31/2015   Procedure: COLONOSCOPY WITH PROPOFOL;  Surgeon: Lollie Sails, MD;  Location: [facility] ENDOSCOPY;  Service: Endoscopy;  Laterality: N/A;  . TONSILLECTOMY      There were no vitals filed for this visit.  Subjective Assessment - 05/30/18 1015    Subjective  Patient is a 63 yo right hand dominant female presenting with increased L sided Low back and hip pain with symptoms extending into the anteriolateral aspect of the knee. Patient reports increased pain when walking for long periods of time (5-61min), ascending/desending the stairs, and sitting for longer periods of time. Patient reports improvement of symptoms with sitting with her LE extended for long periods of time and had tried taking gabapentin which has improved slightly but not long lasting change. Patient states her pain has been on and off for the past 10 years. Patient reports her pain has been about the same for a past 3 weeks.      Pertinent History  Increased pain along R knee, past increased pain for past 10 years    How long can you walk comfortably?  5-10 min    Patient Stated Goals  To be able to walk up and  down the stairs without increase in pain    Currently in Pain?  Yes    Pain Score  4    worst 10/10   Pain Location  Back    Pain Orientation  Left    Pain Descriptors / Indicators  Aching    Pain Type  Chronic pain    Pain Onset  More than a month ago    Pain Frequency  Intermittent         [facility] PT Assessment - 05/30/18 1017      Assessment   Medical Diagnosis  L sided LBP    Referring Provider (PT)  Humphrey Rolls MD    Onset Date/Surgical Date  01/05/08    Hand Dominance  Right    Next MD Visit  unknown    Prior Therapy  yes - same      Balance Screen   Has the patient fallen in the past 6 months  No    Has the patient had a decrease in activity level because of a fear of falling?   Yes    Is the patient reluctant to leave their home because of a fear of falling?   No      Home Film/video editor  residence    Living Arrangements  Spouse/significant other    Type of [place] to enter    Entrance Stairs-Number of Steps  1    Entrance Stairs-Rails  Can reach both      Prior Function   Level of Independence  Independent    Vocation  Retired    [employer]  N/A    Leisure  traveling       Cognition   Overall Cognitive Status  Within Functional Limits for tasks assessed      Observation/Other Assessments   Observations  forward lean in standing      Sensation   Light Touch  Appears Intact      Functional Tests   Functional tests  Step down      Step Down   Comments  Poor Knee control B only performs on R LE      Posture/Postural Control   Posture Comments  Forward lean in standing decreased hip extension      ROM / Strength   AROM / PROM / Strength  AROM;Strength      AROM   AROM Assessment Site  Hip;Knee;Lumbar    Right/Left Hip  Right;Left    Right Hip Extension  5    Right Hip Flexion  95    Right Hip External Rotation   20    Right Hip Internal Rotation   25    Right Hip ABduction  40    Right  Hip ADduction  30    Left Hip Extension  5   pain   Left Hip Flexion  95   pain   Left Hip External Rotation   20   pain   Left Hip Internal Rotation   20   pain   Left Hip ABduction  30    Left Hip ADduction  30    Right/Left Knee  Right;Left    Right Knee Extension  0    Right Knee Flexion  110    Left Knee Extension  0    Left Knee Flexion  110    Lumbar Flexion  WNL   Increased pain upon returning straight   Lumbar Extension  limited 50%    Lumbar - Right Side Bend  75% pain on L    Lumbar - Left Side Bend  75%    Lumbar - Right Rotation  WNL    Lumbar - Left Rotation  WNL      Strength   Strength Assessment Site  Hip;Knee;Lumbar    Right/Left Hip  Right;Left    Right Hip Flexion  4/5    Right Hip Extension  4-/5    Right Hip External Rotation   4/5    Right Hip Internal Rotation  4/5    Right Hip ABduction  4/5    Right Hip ADduction  4+/5    Left Hip Flexion  3/5    Left Hip Extension  4/5    Left Hip External Rotation  3+/5    Left Hip Internal Rotation  3+/5   pain   Left Hip ABduction  4/5    Left Hip ADduction  4+/5    Right/Left Knee  Right;Left    Right Knee Flexion  4+/5    Right Knee Extension  5/5    Left Knee Flexion  4+/5    Left Knee Extension  5/5    Lumbar Flexion  4+/5  Lumbar Extension  4+/5      Palpation   Spinal mobility  Hypomob: L4-5 with increased pain    Palpation comment  TTP along glute max on L      Special Tests    Special Tests  Lumbar    Lumbar Tests  Slump Test      Slump test   Findings  Positive    Side  Left    Comment  Increased in concordant pain      Ambulation/Gait   Gait Comments  Narrow BOS, patient's steppage often crosses midline                           PT Education - 05/30/18 1224    Education Details  form/technique with exercise; Added bridges, LTRs, SLUMP, lumbar extension    Person(s) Educated  Patient    Methods  Explanation;Demonstration    Comprehension  Verbalized  understanding;Returned demonstration          PT Long Term Goals - 05/30/18 1232      PT LONG TERM GOAL #1   Title  Patient will be independent with HEP to continue benefits of therapy after discharge.    Baseline  Dependent with form and technique    Time  6    Period  Weeks    Status  New    Target Date  07/11/18      PT LONG TERM GOAL #2   Title  Patient will be able to walk for 1 hour to better be able to perform traveling activities without increase in pain    Baseline  able to walk 5-10 min    Time  6    Period  Weeks    Status  New    Target Date  07/11/18      PT LONG TERM GOAL #3   Title  Patient will be able to ascend/desend stairs without increase in pain and without side stepping the movement.     Baseline  Able to improve side stepping     Time  6    Period  Weeks    Status  New    Target Date  07/11/18            Plan - 05/30/18 1226    Clinical Impression Statement  Patient is a 63 yo right hand dominant female presenting with increased pain and spasms along her LBP and L hip. Patient demonstrates increased pain and spasms along her hip indicating hip/lumbar dysfunction with possible sciatic dysfunciton. Patient demonstrates poor hip strength limiting functional mobility such as performing hip extension, abduction, and IR on the L. Patient will benefit from further skilled therapy to return to prior level of function.      Personal Factors and Comorbidities  Age;Behavior Pattern;Comorbidity 1    Comorbidities  Chronic pain, previous knee pain    Examination-Activity Limitations  Bend;Lift;Caring for Others;Carry    Examination-Participation Restrictions  Yard Work;Cleaning;Community Activity    Stability/Clinical Decision Making  Stable/Uncomplicated    Clinical Decision Making  Moderate    Rehab Potential  Fair    PT Frequency  2x / week    PT Duration  6 weeks    PT Treatment/Interventions  Therapeutic activities;Therapeutic exercise;Balance  training;Functional mobility training;Moist Heat;Cryotherapy;Patient/family education;Manual techniques;Dry needling;Passive range of motion;Spinal Manipulations;Joint Manipulations    PT Next Visit Plan  continue to progress exercises       Patient will benefit from  skilled therapeutic intervention in order to improve the following deficits and impairments:  Increased muscle spasms, Difficulty walking, Impaired sensation, Decreased range of motion, Decreased strength, Decreased activity tolerance, Decreased balance, Decreased endurance, Decreased coordination  Visit Diagnosis: Pain in left hip - Plan: PT plan of care cert/re-cert  Stiffness of left hip, not elsewhere classified - Plan: PT plan of care cert/re-cert  Chronic left-sided low back pain, unspecified whether sciatica present - Plan: PT plan of care cert/re-cert     Problem List Patient Active Problem List   Diagnosis Date Noted  . Lumbar disc disease with radiculopathy 05/22/2018  . Cutaneous candidiasis 05/22/2018  . Dysuria 05/22/2018  . Acute pharyngitis 04/02/2018  . Sore throat 04/02/2018  . Pain in joint of right knee 01/29/2018  . Chest pain 10/17/2017  . SOB (shortness of breath) 10/17/2017  . Vasomotor symptoms due to menopause 10/17/2017  . Routine cervical smear 10/17/2017  . Mixed hyperlipidemia 01/05/2017  . Essential hypertension 12/17/2014  . Obesity 12/17/2014  . Obstructive sleep apnea 12/17/2014  . Proteinuria 12/17/2014    [name] 05/30/2018, 12:49 PM  [facility] [address], [place], [address] Phone: [phone]   Fax:  [phone]  Name: Jamie Powers MRN: [id number] Date of Birth: [birth year]

## 2018-05-30 NOTE — Therapy (Signed)
[facility] [address] [place], [address] Phone: [phone]   Fax:  [phone]  Physical Therapy Evaluation  Patient Details  Name: Jamie Powers MRN: [id number] Date of Birth: March 04, 1955 Referring Provider (PT): Humphrey Rolls MD   Encounter Date: 05/30/2018  PT End of Session - 05/30/18 1225    Visit Number  1    Number of Visits  13    Date for PT Re-Evaluation  07/11/18    PT Start Time  1000    PT Stop Time  1045    PT Time Calculation (min)  45 min       Past Medical History:  Diagnosis Date  . Hypertension   . Obesity   . Protein in urine   . Sleep apnea     Past Surgical History:  Procedure Laterality Date  . BREAST BIOPSY Left 2000   neg  . BREAST SURGERY    . CHOLECYSTECTOMY    . COLONOSCOPY WITH PROPOFOL N/A 01/31/2015   Procedure: COLONOSCOPY WITH PROPOFOL;  Surgeon: Lollie Sails, MD;  Location: [facility] ENDOSCOPY;  Service: Endoscopy;  Laterality: N/A;  . TONSILLECTOMY      There were no vitals filed for this visit.   Subjective Assessment - 05/30/18 1015    Subjective  Patient is a 63 yo right hand dominant female presenting with increased L sided Low back and hip pain with symptoms extending into the anteriolateral aspect of the knee. Patient reports increased pain when walking for long periods of time (5-38min), ascending/desending the stairs, and sitting for longer periods of time. Patient reports improvement of symptoms with sitting with her LE extended for long periods of time and had tried taking gabapentin which has improved slightly but not long lasting change. Patient states her pain has been on and off for the past 10 years. Patient reports her pain has been about the same for a past 3 weeks.      Pertinent History  Increased pain along R knee, past increased pain for past 10 years    How long can you walk comfortably?  5-10 min    Patient Stated Goals  To be able to walk up  and down the stairs without increase in pain    Currently in Pain?  Yes    Pain Score  4    worst 10/10   Pain Location  Back    Pain Orientation  Left    Pain Descriptors / Indicators  Aching    Pain Type  Chronic pain    Pain Onset  More than a month ago    Pain Frequency  Intermittent         [facility] PT Assessment - 05/30/18 1017      Assessment   Medical Diagnosis  L sided LBP    Referring Provider (PT)  Humphrey Rolls MD    Onset Date/Surgical Date  01/05/08    Hand Dominance  Right    Next MD Visit  unknown    Prior Therapy  yes - same      Balance Screen   Has the patient fallen in the past 6 months  No    Has the patient had a decrease in activity level because of a fear of falling?   Yes    Is the patient reluctant to leave their home because of a fear of falling?   No      Home Environment   Living Environment  Private residence    Living Arrangements  Spouse/significant other    Type of [place] to enter    Entrance Stairs-Number of Steps  1    Entrance Stairs-Rails  Can reach both      Prior Function   Level of Independence  Independent    Vocation  Retired    Biomedical scientist  N/A    Leisure  traveling       Cognition   Overall Cognitive Status  Within Functional Limits for tasks assessed      Observation/Other Assessments   Observations  forward lean in standing      Sensation   Light Touch  Appears Intact      Functional Tests   Functional tests  Step down      Step Down   Comments  Poor Knee control B only performs on R LE      Posture/Postural Control   Posture Comments  Forward lean in standing decreased hip extension      ROM / Strength   AROM / PROM / Strength  AROM;Strength      AROM   AROM Assessment Site  Hip;Knee;Lumbar    Right/Left Hip  Right;Left    Right Hip Extension  5    Right Hip Flexion  95    Right Hip External Rotation   20    Right Hip Internal Rotation   25    Right Hip ABduction  40     Right Hip ADduction  30    Left Hip Extension  5   pain   Left Hip Flexion  95   pain   Left Hip External Rotation   20   pain   Left Hip Internal Rotation   20   pain   Left Hip ABduction  30    Left Hip ADduction  30    Right/Left Knee  Right;Left    Right Knee Extension  0    Right Knee Flexion  110    Left Knee Extension  0    Left Knee Flexion  110    Lumbar Flexion  WNL   Increased pain upon returning straight   Lumbar Extension  limited 50%    Lumbar - Right Side Bend  75% pain on L    Lumbar - Left Side Bend  75%    Lumbar - Right Rotation  WNL    Lumbar - Left Rotation  WNL      Strength   Strength Assessment Site  Hip;Knee;Lumbar    Right/Left Hip  Right;Left    Right Hip Flexion  4/5    Right Hip Extension  4-/5    Right Hip External Rotation   4/5    Right Hip Internal Rotation  4/5    Right Hip ABduction  4/5    Right Hip ADduction  4+/5    Left Hip Flexion  3/5    Left Hip Extension  4/5    Left Hip External Rotation  3+/5    Left Hip Internal Rotation  3+/5   pain   Left Hip ABduction  4/5    Left Hip ADduction  4+/5    Right/Left Knee  Right;Left    Right Knee Flexion  4+/5    Right Knee Extension  5/5    Left Knee Flexion  4+/5    Left Knee Extension  5/5    Lumbar Flexion  4+/5  Lumbar Extension  4+/5      Palpation   Spinal mobility  Hypomob: L4-5 with increased pain    Palpation comment  TTP along glute max on L      Special Tests    Special Tests  Lumbar    Lumbar Tests  Slump Test      Slump test   Findings  Positive    Side  Left    Comment  Increased in concordant pain      Ambulation/Gait   Gait Comments  Narrow BOS, patient's steppage often crosses midline        Objective measurements completed on examination: See above findings.    TREATMENT Therapeutic Exercise: SLUMP flossing -- 2 x 10 Lumbar extension -- 3 x 10 Bridges -- 2 x 10 LTRs in hooklyiing -- x 10  Patient demonstrates improvement in symptoms at the  end of the session. Performed exercises to improve patient's pain and spasms.           PT Education - 05/30/18 1224    Education Details  form/technique with exercise; Added bridges, LTRs, SLUMP, lumbar extension    Person(s) Educated  Patient    Methods  Explanation;Demonstration    Comprehension  Verbalized understanding;Returned demonstration          PT Long Term Goals - 05/30/18 1232      PT LONG TERM GOAL #1   Title  Patient will be independent with HEP to continue benefits of therapy after discharge.    Baseline  Dependent with form and technique    Time  6    Period  Weeks    Status  New    Target Date  07/11/18      PT LONG TERM GOAL #2   Title  Patient will be able to walk for 1 hour to better be able to perform traveling activities without increase in pain    Baseline  able to walk 5-10 min    Time  6    Period  Weeks    Status  New    Target Date  07/11/18      PT LONG TERM GOAL #3   Title  Patient will be able to ascend/desend stairs without increase in pain and without side stepping the movement.     Baseline  Able to improve side stepping     Time  6    Period  Weeks    Status  New    Target Date  07/11/18             Plan - 05/30/18 1226    Clinical Impression Statement  Patient is a 63 yo right hand dominant female presenting with increased pain and spasms along her LBP and L hip. Patient demonstrates increased pain and spasms along her hip indicating hip/lumbar dysfunction with possible sciatic dysfunciton. Patient demonstrates poor hip strength limiting functional mobility such as performing hip extension, abduction, and IR on the L. Patient will benefit from further skilled therapy to return to prior level of function.      Personal Factors and Comorbidities  Age;Behavior Pattern;Comorbidity 1    Comorbidities  Chronic pain, previous knee pain    Examination-Activity Limitations  Bend;Lift;Caring for Others;Carry     Examination-Participation Restrictions  Yard Work;Cleaning;Community Activity    Stability/Clinical Decision Making  Stable/Uncomplicated    Clinical Decision Making  Moderate    Rehab Potential  Fair    PT Frequency  2x / week  PT Duration  6 weeks    PT Treatment/Interventions  Therapeutic activities;Therapeutic exercise;Balance training;Functional mobility training;Moist Heat;Cryotherapy;Patient/family education;Manual techniques;Dry needling;Passive range of motion;Spinal Manipulations;Joint Manipulations    PT Next Visit Plan  continue to progress exercises       Patient will benefit from skilled therapeutic intervention in order to improve the following deficits and impairments:  Increased muscle spasms, Difficulty walking, Impaired sensation, Decreased range of motion, Decreased strength, Decreased activity tolerance, Decreased balance, Decreased endurance, Decreased coordination  Visit Diagnosis: Pain in left hip - Plan: PT plan of care cert/re-cert  Stiffness of left hip, not elsewhere classified - Plan: PT plan of care cert/re-cert  Chronic left-sided low back pain, unspecified whether sciatica present - Plan: PT plan of care cert/re-cert     Problem List Patient Active Problem List   Diagnosis Date Noted  . Lumbar disc disease with radiculopathy 05/22/2018  . Cutaneous candidiasis 05/22/2018  . Dysuria 05/22/2018  . Acute pharyngitis 04/02/2018  . Sore throat 04/02/2018  . Pain in joint of right knee 01/29/2018  . Chest pain 10/17/2017  . SOB (shortness of breath) 10/17/2017  . Vasomotor symptoms due to menopause 10/17/2017  . Routine cervical smear 10/17/2017  . Mixed hyperlipidemia 01/05/2017  . Essential hypertension 12/17/2014  . Obesity 12/17/2014  . Obstructive sleep apnea 12/17/2014  . Proteinuria 12/17/2014    [name], PT DPT 05/30/2018, 12:49 PM  [facility] [address] [place], [address] Phone: [phone]   Fax:  [phone]  Name: Jamie Powers MRN: [id number] Date of Birth: 1955-05-30

## 2018-05-31 ENCOUNTER — Telehealth: Payer: Self-pay

## 2018-05-31 LAB — PAP IG AND HPV HIGH-RISK: HPV, high-risk: NEGATIVE

## 2018-05-31 NOTE — Telephone Encounter (Signed)
Informed pt of pap results

## 2018-05-31 NOTE — Telephone Encounter (Signed)
-----   Message from Ronnell Freshwater, NP sent at 05/31/2018  9:12 AM EDT ----- Please let the patient know that her pap smear is normal. thanks

## 2018-06-01 ENCOUNTER — Ambulatory Visit: Admission: RE | Admit: 2018-06-01 | Payer: BLUE CROSS/BLUE SHIELD | Source: Home / Self Care | Admitting: *Deleted

## 2018-06-01 ENCOUNTER — Ambulatory Visit: Payer: BLUE CROSS/BLUE SHIELD

## 2018-06-01 ENCOUNTER — Other Ambulatory Visit: Payer: Self-pay

## 2018-06-01 DIAGNOSIS — G8929 Other chronic pain: Secondary | ICD-10-CM

## 2018-06-01 DIAGNOSIS — M545 Low back pain, unspecified: Secondary | ICD-10-CM

## 2018-06-01 DIAGNOSIS — M25652 Stiffness of left hip, not elsewhere classified: Secondary | ICD-10-CM

## 2018-06-01 DIAGNOSIS — M25552 Pain in left hip: Secondary | ICD-10-CM | POA: Diagnosis not present

## 2018-06-01 NOTE — Therapy (Signed)
[place] [facility] [address] [place], [address] Phone: [phone]   Fax:  [phone]  Physical Therapy Treatment  Patient Details  Name: Jamie Powers MRN: [id number] Date of Birth: December 27, 1955 Referring Provider (PT): Humphrey Rolls MD   Encounter Date: 06/01/2018  PT End of Session - 06/01/18 1218    Visit Number  2    Number of Visits  13    Date for PT Re-Evaluation  07/11/18    PT Start Time  1100    PT Stop Time  1145    PT Time Calculation (min)  45 min    Activity Tolerance  Patient tolerated treatment well    Behavior During Therapy  [facility] for tasks assessed/performed       Past Medical History:  Diagnosis Date  . Hypertension   . Obesity   . Protein in urine   . Sleep apnea     Past Surgical History:  Procedure Laterality Date  . BREAST BIOPSY Left 2000   neg  . BREAST SURGERY    . CHOLECYSTECTOMY    . COLONOSCOPY WITH PROPOFOL N/A 01/31/2015   Procedure: COLONOSCOPY WITH PROPOFOL;  Surgeon: Lollie Sails, MD;  Location: [facility] ENDOSCOPY;  Service: Endoscopy;  Laterality: N/A;  . TONSILLECTOMY      There were no vitals filed for this visit.  Subjective Assessment - 06/01/18 1119    Subjective  Patient states she has been wokring on her exercises and reports they have been helping but the pain is not completely abolished. Patient states she is slightly sore from working in her garden.     Pertinent History  Increased pain along R knee, past increased pain for past 10 years    How long can you walk comfortably?  5-10 min    Patient Stated Goals  To be able to walk up and down the stairs without increase in pain    Currently in Pain?  Yes    Pain Score  3     Pain Location  Back    Pain Orientation  Left    Pain Type  Chronic pain    Pain Onset  More than a month ago    Pain Frequency  Intermittent         TREATMENT Therapeutic Exercise Sit to stands with lumbar extension - x 14 SLUMP  flossing with leg - head movement; with foot - head movement - 2 x 10 Seated hip IR with YTB around ankles - x 10 Hip abduction - x 10 through limited AROM with UE support Seated multifidus crunch in sitting - -x 20 Supine pelvic tilts - x 20  Bridges in hooklying - x 15 Bridges in hooklying with belt around LEs pushing into abduction - x 20  Prone presses in prone - x 20 Patient demonstrates improvement with exercises at the end of the session. Performed to decrease pain with hip/lumbar extension.   PT Education - 06/01/18 1126    Education Details  form/technique with exercises: Multifidus crunches in sitting    Person(s) Educated  Patient    Methods  Explanation;Demonstration    Comprehension  Verbalized understanding;Returned demonstration          PT Long Term Goals - 05/30/18 1232      PT LONG TERM GOAL #1   Title  Patient will be independent with HEP to continue benefits of therapy after discharge.    Baseline  Dependent with form and technique  Time  6    Period  Weeks    Status  New    Target Date  07/11/18      PT LONG TERM GOAL #2   Title  Patient will be able to walk for 1 hour to better be able to perform traveling activities without increase in pain    Baseline  able to walk 5-10 min    Time  6    Period  Weeks    Status  New    Target Date  07/11/18      PT LONG TERM GOAL #3   Title  Patient will be able to ascend/desend stairs without increase in pain and without side stepping the movement.     Baseline  Able to improve side stepping     Time  6    Period  Weeks    Status  New    Target Date  07/11/18            Plan - 06/01/18 1218    Clinical Impression Statement  Patient demonstrates improvement overall with symptoms, however continues to have difficulty with performing hip abduction in standing with onset of pain upon performance. Patient demonstrates decreased pain after performing lumbar extension based exercises. Patient will benefit  from further skilled therapy foucsed on improving hip abduction strength. Patient will benefit from further skilled therapy to return to prior level of function.     Personal Factors and Comorbidities  Age;Behavior Pattern;Comorbidity 1    Comorbidities  Chronic pain, previous knee pain    Examination-Activity Limitations  Bend;Lift;Caring for Others;Carry    Examination-Participation Restrictions  Yard Work;Cleaning;Community Activity    Stability/Clinical Decision Making  Stable/Uncomplicated    Rehab Potential  Fair    PT Frequency  2x / week    PT Duration  6 weeks    PT Treatment/Interventions  Therapeutic activities;Therapeutic exercise;Balance training;Functional mobility training;Moist Heat;Cryotherapy;Patient/family education;Manual techniques;Dry needling;Passive range of motion;Spinal Manipulations;Joint Manipulations    PT Next Visit Plan  continue to progress exercises       Patient will benefit from skilled therapeutic intervention in order to improve the following deficits and impairments:  Increased muscle spasms, Difficulty walking, Impaired sensation, Decreased range of motion, Decreased strength, Decreased activity tolerance, Decreased balance, Decreased endurance, Decreased coordination  Visit Diagnosis: Pain in left hip  Stiffness of left hip, not elsewhere classified  Chronic left-sided low back pain, unspecified whether sciatica present     Problem List Patient Active Problem List   Diagnosis Date Noted  . Lumbar disc disease with radiculopathy 05/22/2018  . Cutaneous candidiasis 05/22/2018  . Dysuria 05/22/2018  . Acute pharyngitis 04/02/2018  . Sore throat 04/02/2018  . Pain in joint of right knee 01/29/2018  . Chest pain 10/17/2017  . SOB (shortness of breath) 10/17/2017  . Vasomotor symptoms due to menopause 10/17/2017  . Routine cervical smear 10/17/2017  . Mixed hyperlipidemia 01/05/2017  . Essential hypertension 12/17/2014  . Obesity 12/17/2014   . Obstructive sleep apnea 12/17/2014  . Proteinuria 12/17/2014    Jamie Powers, PT DPT 06/01/2018, 12:23 PM  [place] [facility] [address] [place], [address] Phone: [phone]   Fax:  [phone]  Name: Jamie Powers Linquist MRN: [id number] Date of Birth: Jan 14, 1955

## 2018-06-06 ENCOUNTER — Other Ambulatory Visit: Payer: Self-pay

## 2018-06-06 ENCOUNTER — Ambulatory Visit: Payer: BLUE CROSS/BLUE SHIELD | Attending: Nurse Practitioner

## 2018-06-06 DIAGNOSIS — M25552 Pain in left hip: Secondary | ICD-10-CM | POA: Insufficient documentation

## 2018-06-06 DIAGNOSIS — M25652 Stiffness of left hip, not elsewhere classified: Secondary | ICD-10-CM

## 2018-06-06 DIAGNOSIS — M545 Low back pain, unspecified: Secondary | ICD-10-CM

## 2018-06-06 DIAGNOSIS — G8929 Other chronic pain: Secondary | ICD-10-CM | POA: Diagnosis present

## 2018-06-06 NOTE — Therapy (Addendum)
[place] [facility] [address], [place], [address] Phone: [phone]   Fax:  [phone]  Physical Therapy Treatment  Patient Details  Name: Jamie Powers MRN: [id number] Date of Birth: 1955/03/28 Referring Provider (PT): Humphrey Rolls MD   Encounter Date: 06/06/2018  PT End of Session - 06/06/18 1137    Visit Number  3    Number of Visits  13    Date for PT Re-Evaluation  07/11/18    PT Start Time  1100    PT Stop Time  1145    PT Time Calculation (min)  45 min    Activity Tolerance  Patient tolerated treatment well    Behavior During Therapy  [facility] for tasks assessed/performed       Past Medical History:  Diagnosis Date  . Hypertension   . Obesity   . Protein in urine   . Sleep apnea     Past Surgical History:  Procedure Laterality Date  . BREAST BIOPSY Left 2000   neg  . BREAST SURGERY    . CHOLECYSTECTOMY    . COLONOSCOPY WITH PROPOFOL N/A 01/31/2015   Procedure: COLONOSCOPY WITH PROPOFOL;  Surgeon: Lollie Sails, MD;  Location[facility] ENDOSCOPY;  Service: Endoscopy;  Laterality: N/A;  . TONSILLECTOMY      There were no vitals filed for this visit.  Subjective Assessment - 06/06/18 1103    Subjective  Patient states her back pain has been about the same but felt a decrease in pain after the previous session.     Pertinent History  Increased pain along R knee, past increased pain for past 10 years    How long can you walk comfortably?  5-10 min    Patient Stated Goals  To be able to walk up and down the stairs without increase in pain    Currently in Pain?  Yes    Pain Score  3     Pain Location  Back    Pain Orientation  Left    Pain Descriptors / Indicators  Aching    Pain Type  Chronic pain    Pain Onset  More than a month ago    Pain Frequency  Intermittent       TREATMENT Therapeutic Exercise Prone press ups -- x 20 Sit to stands with hip/lumbar extension -- x 20  LTRs in hooklying -- x  20  Lumbar extension in standing -- x 20  Standing hip abduction -- x 20  Bridges in hooklying -- x20    Patient demonstrates decreased pain at the end of the session. Performed exercises to decrease pain throughout the shoulder.  PT Education - 06/06/18 1127    Education Details  form/technique with exercises; updated exercises: LTRs bridges lumbar extension in standing prone press ups    Person(s) Educated  Patient    Methods  Explanation;Demonstration;Handout    Comprehension  Verbalized understanding;Returned demonstration          PT Long Term Goals - 05/30/18 1232      PT LONG TERM GOAL #1   Title  Patient will be independent with HEP to continue benefits of therapy after discharge.    Baseline  Dependent with form and technique    Time  6    Period  Weeks    Status  New    Target Date  07/11/18      PT LONG TERM GOAL #2   Title  Patient will be  able to walk for 1 hour to better be able to perform traveling activities without increase in pain    Baseline  able to walk 5-10 min    Time  6    Period  Weeks    Status  New    Target Date  07/11/18      PT LONG TERM GOAL #3   Title  Patient will be able to ascend/desend stairs without increase in pain and without side stepping the movement.     Baseline  Able to improve side stepping     Time  6    Period  Weeks    Status  New    Target Date  07/11/18            Plan - 06/06/18 1358    Clinical Impression Statement  Patient demonstrates decreased pain most notably after performing prone press ups indicating direction perference of extension with exercise. Patient reports no pain at the end of the session and is able to consistently change symptoms with performing of prone press ups. Although patient is improving, she continues to have pain with prolonged walking and will benefit from furhter skilled therapy to return to prior level of function.     Personal Factors and Comorbidities  Age;Behavior  Pattern;Comorbidity 1    Comorbidities  Chronic pain, previous knee pain    Examination-Activity Limitations  Bend;Lift;Caring for Others;Carry    Examination-Participation Restrictions  Yard Work;Cleaning;Community Activity    Stability/Clinical Decision Making  Stable/Uncomplicated    Rehab Potential  Fair    PT Frequency  2x / week    PT Duration  6 weeks    PT Treatment/Interventions  Therapeutic activities;Therapeutic exercise;Balance training;Functional mobility training;Moist Heat;Cryotherapy;Patient/family education;Manual techniques;Dry needling;Passive range of motion;Spinal Manipulations;Joint Manipulations    PT Next Visit Plan  continue to progress exercises       Patient will benefit from skilled therapeutic intervention in order to improve the following deficits and impairments:  Increased muscle spasms, Difficulty walking, Impaired sensation, Decreased range of motion, Decreased strength, Decreased activity tolerance, Decreased balance, Decreased endurance, Decreased coordination  Visit Diagnosis: Pain in left hip  Stiffness of left hip, not elsewhere classified  Chronic left-sided low back pain, unspecified whether sciatica present     Problem List Patient Active Problem List   Diagnosis Date Noted  . Lumbar disc disease with radiculopathy 05/22/2018  . Cutaneous candidiasis 05/22/2018  . Dysuria 05/22/2018  . Acute pharyngitis 04/02/2018  . Sore throat 04/02/2018  . Pain in joint of right knee 01/29/2018  . Chest pain 10/17/2017  . SOB (shortness of breath) 10/17/2017  . Vasomotor symptoms due to menopause 10/17/2017  . Routine cervical smear 10/17/2017  . Mixed hyperlipidemia 01/05/2017  . Essential hypertension 12/17/2014  . Obesity 12/17/2014  . Obstructive sleep apnea 12/17/2014  . Proteinuria 12/17/2014    [name], PT DPT 06/06/2018, 2:01 PM  [place] [facility] [address], [place], [address] Phone: [phone]   Fax:  [phone]  Name: Bobbi Yount MRN: [id number] Date of Birth: January 12, 1955

## 2018-06-08 ENCOUNTER — Other Ambulatory Visit: Payer: Self-pay

## 2018-06-08 ENCOUNTER — Ambulatory Visit: Payer: BLUE CROSS/BLUE SHIELD

## 2018-06-08 DIAGNOSIS — M25552 Pain in left hip: Secondary | ICD-10-CM

## 2018-06-08 DIAGNOSIS — M25652 Stiffness of left hip, not elsewhere classified: Secondary | ICD-10-CM

## 2018-06-08 NOTE — Therapy (Signed)
[place] [facility] [address] [place], [address] Phone: [phone]   Fax:  [phone]  Physical Therapy Treatment  Patient Details  Name: Jamie Powers MRN: [id number] Date of Birth: [birth year] Referring Provider (PT): Humphrey Rolls MD   Encounter Date: 06/08/2018  PT End of Session - 06/08/18 1300    Visit Number  4    Number of Visits  13    Date for PT Re-Evaluation  07/11/18    PT Start Time  1100    PT Stop Time  1145    PT Time Calculation (min)  45 min    Activity Tolerance  Patient tolerated treatment well    Behavior During Therapy  [facility] for tasks assessed/performed       Past Medical History:  Diagnosis Date  . Hypertension   . Obesity   . Protein in urine   . Sleep apnea     Past Surgical History:  Procedure Laterality Date  . BREAST BIOPSY Left 2000   neg  . BREAST SURGERY    . CHOLECYSTECTOMY    . COLONOSCOPY WITH PROPOFOL N/A 01/31/2015   Procedure: COLONOSCOPY WITH PROPOFOL;  Surgeon: Lollie Sails, MD;  Location: [facility] ENDOSCOPY;  Service: Endoscopy;  Laterality: N/A;  . TONSILLECTOMY      There were no vitals filed for this visit.  Subjective Assessment - 06/08/18 1103    Subjective  Patient states her back continues to improve and has limited to no pain currently.     Pertinent History  Increased pain along R knee, past increased pain for past 10 years    How long can you walk comfortably?  5-10 min    Patient Stated Goals  To be able to walk up and down the stairs without increase in pain    Currently in Pain?  Yes    Pain Score  1     Pain Location  Back    Pain Orientation  Left    Pain Descriptors / Indicators  Aching    Pain Type  Chronic pain    Pain Onset  More than a month ago    Pain Frequency  Intermittent       TREATMENT Therapeutic Exercise Hip IR/ER in prone - x20 Hip extension AAROM on the R - x 20 Ely's mobilization in prone bending - x 20 Prone press ups  on elbows - x20 Lumbar / hip extension with arms straight CKC - x 20  Hip extension in standing - x 20 Step downs off of 4" step - x 20 Isometric knee extension in sitting - x 20 5 sec  Patient demonstrates decreased pain at the end of the session. Performed exercises to decrease pain throughout the shoulder.   PT Education - 06/08/18 1300    Education Details  form/technique with exercises; added knee extension resisted    Person(s) Educated  Patient    Methods  Explanation;Demonstration    Comprehension  Verbalized understanding;Returned demonstration          PT Long Term Goals - 05/30/18 1232      PT LONG TERM GOAL #1   Title  Patient will be independent with HEP to continue benefits of therapy after discharge.    Baseline  Dependent with form and technique    Time  6    Period  Weeks    Status  New    Target Date  07/11/18      PT  LONG TERM GOAL #2   Title  Patient will be able to walk for 1 hour to better be able to perform traveling activities without increase in pain    Baseline  able to walk 5-10 min    Time  6    Period  Weeks    Status  New    Target Date  07/11/18      PT LONG TERM GOAL #3   Title  Patient will be able to ascend/desend stairs without increase in pain and without side stepping the movement.     Baseline  Able to improve side stepping     Time  6    Period  Weeks    Status  New    Target Date  07/11/18            Plan - 06/08/18 1301    Clinical Impression Statement  Patient demonstrates cnztant improvement with hip/low back pain. However continues to have difficulty with performing hip abduction exercises but demonstrates ability to decreased pain consistently with lumbar/hip abduction. Patient will benefi from further skilled therapy focused on improving limitations to return to prior level of function.     Personal Factors and Comorbidities  Age;Behavior Pattern;Comorbidity 1    Comorbidities  Chronic pain, previous knee pain     Examination-Activity Limitations  Bend;Lift;Caring for Others;Carry    Examination-Participation Restrictions  Yard Work;Cleaning;Community Activity    Stability/Clinical Decision Making  Stable/Uncomplicated    Rehab Potential  Fair    PT Frequency  2x / week    PT Duration  6 weeks    PT Treatment/Interventions  Therapeutic activities;Therapeutic exercise;Balance training;Functional mobility training;Moist Heat;Cryotherapy;Patient/family education;Manual techniques;Dry needling;Passive range of motion;Spinal Manipulations;Joint Manipulations    PT Next Visit Plan  continue to progress exercises       Patient will benefit from skilled therapeutic intervention in order to improve the following deficits and impairments:  Increased muscle spasms, Difficulty walking, Impaired sensation, Decreased range of motion, Decreased strength, Decreased activity tolerance, Decreased balance, Decreased endurance, Decreased coordination  Visit Diagnosis: Pain in left hip  Stiffness of left hip, not elsewhere classified     Problem List Patient Active Problem List   Diagnosis Date Noted  . Lumbar disc disease with radiculopathy 05/22/2018  . Cutaneous candidiasis 05/22/2018  . Dysuria 05/22/2018  . Acute pharyngitis 04/02/2018  . Sore throat 04/02/2018  . Pain in joint of right knee 01/29/2018  . Chest pain 10/17/2017  . SOB (shortness of breath) 10/17/2017  . Vasomotor symptoms due to menopause 10/17/2017  . Routine cervical smear 10/17/2017  . Mixed hyperlipidemia 01/05/2017  . Essential hypertension 12/17/2014  . Obesity 12/17/2014  . Obstructive sleep apnea 12/17/2014  . Proteinuria 12/17/2014    [name], PT DPT 06/08/2018, 1:24 PM  [place] [facility] [address] [place], [address] Phone: [phone]   Fax:  [phone]  Name: Mellie Buccellato MRN: [id number] Date of Birth: 1955/08/17

## 2018-06-13 ENCOUNTER — Other Ambulatory Visit: Payer: Self-pay

## 2018-06-13 ENCOUNTER — Ambulatory Visit: Payer: BLUE CROSS/BLUE SHIELD

## 2018-06-13 DIAGNOSIS — M25552 Pain in left hip: Secondary | ICD-10-CM

## 2018-06-13 DIAGNOSIS — G8929 Other chronic pain: Secondary | ICD-10-CM

## 2018-06-13 DIAGNOSIS — M545 Low back pain, unspecified: Secondary | ICD-10-CM

## 2018-06-13 DIAGNOSIS — M25652 Stiffness of left hip, not elsewhere classified: Secondary | ICD-10-CM

## 2018-06-13 NOTE — Therapy (Signed)
[place] [facility] [address] [place], [address] Phone: [phone]   Fax:  [phone]  Physical Therapy Treatment  Patient Details  Name: Jamie Powers MRN: [id number] Date of Birth: Feb 09, 1955 Referring Provider (PT): Humphrey Rolls MD   Encounter Date: 06/13/2018  PT End of Session - 06/13/18 1143    Visit Number  5    Number of Visits  13    Date for PT Re-Evaluation  07/11/18    PT Start Time  1100    PT Stop Time  1145    PT Time Calculation (min)  45 min    Activity Tolerance  Patient tolerated treatment well    Behavior During Therapy  [facility] for tasks assessed/performed       Past Medical History:  Diagnosis Date  . Hypertension   . Obesity   . Protein in urine   . Sleep apnea     Past Surgical History:  Procedure Laterality Date  . BREAST BIOPSY Left 2000   neg  . BREAST SURGERY    . CHOLECYSTECTOMY    . COLONOSCOPY WITH PROPOFOL N/A 01/31/2015   Procedure: COLONOSCOPY WITH PROPOFOL;  Surgeon: Lollie Sails, MD;  Location: [facility] ENDOSCOPY;  Service: Endoscopy;  Laterality: N/A;  . TONSILLECTOMY      There were no vitals filed for this visit.  Subjective Assessment - 06/13/18 1106    Subjective  Patient states her back was hurting a little bit after performing prolonged bending with working in the yard over the weekend. But patient states no pain currently.     Pertinent History  Increased pain along R knee, past increased pain for past 10 years    How long can you walk comfortably?  5-10 min    Patient Stated Goals  To be able to walk up and down the stairs without increase in pain    Currently in Pain?  No/denies    Pain Onset  More than a month ago         TREATMENT Therapeutic Exercise Prone press ups on elbows - x20 Hip abduction with UE support Lumbar / hip extension with arms straight CKC - x 20  Lumbar extension with L lat flexion at end range - x 20  Lumbar extension with  reaching overhead - x 20 with 5# Step downs off of 4" step - x 20 Isometric knee extension in sitting - x 20 5 sec  Manual therapy STM performed the L multifidus to decrease increased pain and spasms with patient positioned in    Patient demonstrates decreased pain at the end of the session. Performed exercises to decrease pain throughout the shoulder.   PT Education - 06/13/18 1127    Education Details  Form/technique with exercises; added heel taps off of 4" step to HEP    Person(s) Educated  Patient    Methods  Explanation;Demonstration    Comprehension  Verbalized understanding;Returned demonstration          PT Long Term Goals - 05/30/18 1232      PT LONG TERM GOAL #1   Title  Patient will be independent with HEP to continue benefits of therapy after discharge.    Baseline  Dependent with form and technique    Time  6    Period  Weeks    Status  New    Target Date  07/11/18      PT LONG TERM GOAL #2   Title  Patient will be able to walk for 1 hour to better be able to perform traveling activities without increase in pain    Baseline  able to walk 5-10 min    Time  6    Period  Weeks    Status  New    Target Date  07/11/18      PT LONG TERM GOAL #3   Title  Patient will be able to ascend/desend stairs without increase in pain and without side stepping the movement.     Baseline  Able to improve side stepping     Time  6    Period  Weeks    Status  New    Target Date  07/11/18            Plan - 06/13/18 1151    Clinical Impression Statement  Patient demonstrates improvement with knee pain after performing exercise isometrics. Patient demosntrates slight increase in symptoms today with extension exercises today but does not aggravate symptoms out of end range extension. Patient also able to move through greater ROM with overhead extension with weight. Patient will benefit from further skilled therapy to return to prior level of function.     Personal Factors  and Comorbidities  Age;Behavior Pattern;Comorbidity 1    Comorbidities  Chronic pain, previous knee pain    Examination-Activity Limitations  Bend;Lift;Caring for Others;Carry    Examination-Participation Restrictions  Yard Work;Cleaning;Community Activity    Stability/Clinical Decision Making  Stable/Uncomplicated    Rehab Potential  Fair    PT Frequency  2x / week    PT Duration  6 weeks    PT Treatment/Interventions  Therapeutic activities;Therapeutic exercise;Balance training;Functional mobility training;Moist Heat;Cryotherapy;Patient/family education;Manual techniques;Dry needling;Passive range of motion;Spinal Manipulations;Joint Manipulations    PT Next Visit Plan  continue to progress exercises       Patient will benefit from skilled therapeutic intervention in order to improve the following deficits and impairments:  Increased muscle spasms, Difficulty walking, Impaired sensation, Decreased range of motion, Decreased strength, Decreased activity tolerance, Decreased balance, Decreased endurance, Decreased coordination  Visit Diagnosis: Pain in left hip  Stiffness of left hip, not elsewhere classified  Chronic left-sided low back pain, unspecified whether sciatica present     Problem List Patient Active Problem List   Diagnosis Date Noted  . Lumbar disc disease with radiculopathy 05/22/2018  . Cutaneous candidiasis 05/22/2018  . Dysuria 05/22/2018  . Acute pharyngitis 04/02/2018  . Sore throat 04/02/2018  . Pain in joint of right knee 01/29/2018  . Chest pain 10/17/2017  . SOB (shortness of breath) 10/17/2017  . Vasomotor symptoms due to menopause 10/17/2017  . Routine cervical smear 10/17/2017  . Mixed hyperlipidemia 01/05/2017  . Essential hypertension 12/17/2014  . Obesity 12/17/2014  . Obstructive sleep apnea 12/17/2014  . Proteinuria 12/17/2014    [name], PT DPT 06/13/2018, 12:06 PM  [place] [facility] [address], [place], [address] Phone: [phone]   Fax:  [phone]  Name: Annaliza Zia MRN: [id number] Date of Birth: 1955-11-23

## 2018-06-20 ENCOUNTER — Ambulatory Visit: Payer: BLUE CROSS/BLUE SHIELD

## 2018-06-20 ENCOUNTER — Other Ambulatory Visit: Payer: Self-pay

## 2018-06-20 DIAGNOSIS — G8929 Other chronic pain: Secondary | ICD-10-CM

## 2018-06-20 DIAGNOSIS — M25552 Pain in left hip: Secondary | ICD-10-CM | POA: Diagnosis not present

## 2018-06-20 DIAGNOSIS — M25652 Stiffness of left hip, not elsewhere classified: Secondary | ICD-10-CM

## 2018-06-20 DIAGNOSIS — M545 Low back pain, unspecified: Secondary | ICD-10-CM

## 2018-06-20 NOTE — Therapy (Signed)
[place] [facility] [address] [place], [address] Phone: [phone]   Fax:  [phone]  Physical Therapy Treatment  Patient Details  Name: Jamie Powers MRN: [id number] Date of Birth: 29-Jan-1955 Referring Provider (PT): Humphrey Rolls MD   Encounter Date: 06/20/2018  PT End of Session - 06/20/18 1117    Visit Number  6    Number of Visits  13    Date for PT Re-Evaluation  07/11/18    PT Start Time  1100    PT Stop Time  1145    PT Time Calculation (min)  45 min    Activity Tolerance  Patient tolerated treatment well    Behavior During Therapy  [facility] for tasks assessed/performed       Past Medical History:  Diagnosis Date  . Hypertension   . Obesity   . Protein in urine   . Sleep apnea     Past Surgical History:  Procedure Laterality Date  . BREAST BIOPSY Left 2000   neg  . BREAST SURGERY    . CHOLECYSTECTOMY    . COLONOSCOPY WITH PROPOFOL N/A 01/31/2015   Procedure: COLONOSCOPY WITH PROPOFOL;  Surgeon: Lollie Sails, MD;  Location: [facility] ENDOSCOPY;  Service: Endoscopy;  Laterality: N/A;  . TONSILLECTOMY      There were no vitals filed for this visit.  Subjective Assessment - 06/20/18 1057    Subjective  Patient reports she had to go up and down a lot of steps over the weekend. She states a lot of the step ups over the weekend.    Pertinent History  Increased pain along R knee, past increased pain for past 10 years    How long can you walk comfortably?  5-10 min    Patient Stated Goals  To be able to walk up and down the stairs without increase in pain    Currently in Pain?  No/denies    Pain Onset  More than a month ago         TREATMENT Therapeutic Exercise Standing knee flexion stretch - x 10 on the R with 3 sec holds Prone knee flexion stretch with isometric holds into knee extension - x 10 on the R with 5 sec holds Side stepping up and down with 6" step with UE support - x 10 B with UE  support Hip flexor stretch in standing - x 10  with use of 6" step B with UE support Forward step ups with 6" step - x 10 B with UE suppoer Hip circles in standing with UE support - x 20  Leg Press with B Les; unilaterally B LE - 55# x 20; unilaterally - 25# x20 Ascending/descending the stairs - x 10 step over step with UE support  Patient demonstrates decreased pain at the end of the session. Performed exercises to decrease pain throughout the hip and lumbar spine.     PT Education - 06/20/18 1110    Education Details  Form/technique with exercises; Standing quad stretch in standing    Person(s) Educated  Patient    Methods  Explanation;Demonstration    Comprehension  Verbalized understanding;Returned demonstration          PT Long Term Goals - 05/30/18 1232      PT LONG TERM GOAL #1   Title  Patient will be independent with HEP to continue benefits of therapy after discharge.    Baseline  Dependent with form and technique  Time  6    Period  Weeks    Status  New    Target Date  07/11/18      PT LONG TERM GOAL #2   Title  Patient will be able to walk for 1 hour to better be able to perform traveling activities without increase in pain    Baseline  able to walk 5-10 min    Time  6    Period  Weeks    Status  New    Target Date  07/11/18      PT LONG TERM GOAL #3   Title  Patient will be able to ascend/desend stairs without increase in pain and without side stepping the movement.     Baseline  Able to improve side stepping     Time  6    Period  Weeks    Status  New    Target Date  07/11/18            Plan - 06/20/18 1119    Clinical Impression Statement  Patient demosntrates overall decrease on the L hip at baseline, however continues to have increased pain with ascending/desending the stairs. Patient demosntrates poor LE strength along the R knee extensors limiting stairs ability, B hip weakness, and increased tightness along her hip flexors B. Patient will  benefit from further skilled therapy focused on improving these limitations to return to prior level of function.    Personal Factors and Comorbidities  Age;Behavior Pattern;Comorbidity 1    Comorbidities  Chronic pain, previous knee pain    Examination-Activity Limitations  Bend;Lift;Caring for Others;Carry    Examination-Participation Restrictions  Yard Work;Cleaning;Community Activity    Stability/Clinical Decision Making  Stable/Uncomplicated    Rehab Potential  Fair    PT Frequency  2x / week    PT Duration  6 weeks    PT Treatment/Interventions  Therapeutic activities;Therapeutic exercise;Balance training;Functional mobility training;Moist Heat;Cryotherapy;Patient/family education;Manual techniques;Dry needling;Passive range of motion;Spinal Manipulations;Joint Manipulations    PT Next Visit Plan  continue to progress exercises       Patient will benefit from skilled therapeutic intervention in order to improve the following deficits and impairments:  Increased muscle spasms, Difficulty walking, Impaired sensation, Decreased range of motion, Decreased strength, Decreased activity tolerance, Decreased balance, Decreased endurance, Decreased coordination  Visit Diagnosis: 1. Pain in left hip   2. Stiffness of left hip, not elsewhere classified   3. Chronic left-sided low back pain, unspecified whether sciatica present        Problem List Patient Active Problem List   Diagnosis Date Noted  . Lumbar disc disease with radiculopathy 05/22/2018  . Cutaneous candidiasis 05/22/2018  . Dysuria 05/22/2018  . Acute pharyngitis 04/02/2018  . Sore throat 04/02/2018  . Pain in joint of right knee 01/29/2018  . Chest pain 10/17/2017  . SOB (shortness of breath) 10/17/2017  . Vasomotor symptoms due to menopause 10/17/2017  . Routine cervical smear 10/17/2017  . Mixed hyperlipidemia 01/05/2017  . Essential hypertension 12/17/2014  . Obesity 12/17/2014  . Obstructive sleep apnea  12/17/2014  . Proteinuria 12/17/2014    [name], PT DPT 06/20/2018, 11:50 AM  [place] [facility] [address] [place], [address] Phone: [phone]   Fax:  [phone]  Name: Renessa Wellnitz MRN: [id number] Date of Birth: 1955/08/19

## 2018-06-22 ENCOUNTER — Other Ambulatory Visit: Payer: Self-pay

## 2018-06-22 ENCOUNTER — Ambulatory Visit: Payer: BLUE CROSS/BLUE SHIELD

## 2018-06-22 DIAGNOSIS — M545 Low back pain, unspecified: Secondary | ICD-10-CM

## 2018-06-22 DIAGNOSIS — G8929 Other chronic pain: Secondary | ICD-10-CM

## 2018-06-22 DIAGNOSIS — M25552 Pain in left hip: Secondary | ICD-10-CM

## 2018-06-22 DIAGNOSIS — M25652 Stiffness of left hip, not elsewhere classified: Secondary | ICD-10-CM

## 2018-06-22 NOTE — Therapy (Signed)
[place] [facility] [address] [place], [address] Phone: [phone]   Fax:  [phone]  Physical Therapy Treatment  Patient Details  Name: Jamie Powers MRN: [id number] Date of Birth: 13-Sep-1955 Referring Provider (PT): Humphrey Rolls MD   Encounter Date: 06/22/2018  PT End of Session - 06/22/18 1111    Visit Number  7    Number of Visits  13    Date for PT Re-Evaluation  07/11/18    PT Start Time  1100    PT Stop Time  1145    PT Time Calculation (min)  45 min    Activity Tolerance  Patient tolerated treatment well    Behavior During Therapy  [facility] for tasks assessed/performed       Past Medical History:  Diagnosis Date  . Hypertension   . Obesity   . Protein in urine   . Sleep apnea     Past Surgical History:  Procedure Laterality Date  . BREAST BIOPSY Left 2000   neg  . BREAST SURGERY    . CHOLECYSTECTOMY    . COLONOSCOPY WITH PROPOFOL N/A 01/31/2015   Procedure: COLONOSCOPY WITH PROPOFOL;  Surgeon: Lollie Sails, MD;  Location: [facility] ENDOSCOPY;  Service: Endoscopy;  Laterality: N/A;  . TONSILLECTOMY      There were no vitals filed for this visit.  Subjective Assessment - 06/22/18 1107    Subjective  Patient states she has not been having as much pain compared to previous sessions. Patient reports improvement in knee pain since the previous session.    Pertinent History  Increased pain along R knee, past increased pain for past 10 years    How long can you walk comfortably?  5-10 min    Patient Stated Goals  To be able to walk up and down the stairs without increase in pain    Currently in Pain?  No/denies    Pain Onset  More than a month ago       TREATMENT Therapeutic Exercise Standing knee flexion stretch - x 10 on the R with 5 sec holds Side stepping up and down with 6" step with UE support - x 10 B with UE support Hip flexor stretch in standing - x 10  with use of 6" step B with UE  support Lumbar/hip extension in standing - x 20 Leg Press with B Les; unilaterally B LE - 55# x 20; unilaterally - 25# x20 Knee extension machine - x20 10# Hip abduction in standing - x 20 with YTB with UE support TKE off of 8" stair - x20 Hip extension in standing with YTB - x 20 B Ascending/descending the stairs - x 10 step over step with UE support   Patient demonstrates decreased pain at the end of the session. Performed exercises to decrease pain throughout the hip and lumbar spine.   PT Education - 06/22/18 1110    Education Details  form/technique with exercises;  reinforced quad stretching in standing    Person(s) Educated  Patient    Methods  Explanation;Demonstration    Comprehension  Verbalized understanding;Returned demonstration          PT Long Term Goals - 05/30/18 1232      PT LONG TERM GOAL #1   Title  Patient will be independent with HEP to continue benefits of therapy after discharge.    Baseline  Dependent with form and technique    Time  6    Period  Weeks    Status  New    Target Date  07/11/18      PT LONG TERM GOAL #2   Title  Patient will be able to walk for 1 hour to better be able to perform traveling activities without increase in pain    Baseline  able to walk 5-10 min    Time  6    Period  Weeks    Status  New    Target Date  07/11/18      PT LONG TERM GOAL #3   Title  Patient will be able to ascend/desend stairs without increase in pain and without side stepping the movement.     Baseline  Able to improve side stepping     Time  6    Period  Weeks    Status  New    Target Date  07/11/18            Plan - 06/22/18 1115    Clinical Impression Statement  Patient is making progress towards long term goals, however, continues to have knee pain with bending of the knee in weight bearing positions. Patient demonstrates improvement in hip pain, with no increase in pain throughout the session. Although she's improving she continues to  have weakness along the hips B and decreased pelvic control with movement. Patient will benefit from further skilled therapy to return to prior level of function.    Personal Factors and Comorbidities  Age;Behavior Pattern;Comorbidity 1    Comorbidities  Chronic pain, previous knee pain    Examination-Activity Limitations  Bend;Lift;Caring for Others;Carry    Examination-Participation Restrictions  Yard Work;Cleaning;Community Activity    Stability/Clinical Decision Making  Stable/Uncomplicated    Rehab Potential  Fair    PT Frequency  2x / week    PT Duration  6 weeks    PT Treatment/Interventions  Therapeutic activities;Therapeutic exercise;Balance training;Functional mobility training;Moist Heat;Cryotherapy;Patient/family education;Manual techniques;Dry needling;Passive range of motion;Spinal Manipulations;Joint Manipulations    PT Next Visit Plan  continue to progress exercises       Patient will benefit from skilled therapeutic intervention in order to improve the following deficits and impairments:  Increased muscle spasms, Difficulty walking, Impaired sensation, Decreased range of motion, Decreased strength, Decreased activity tolerance, Decreased balance, Decreased endurance, Decreased coordination  Visit Diagnosis: 1. Pain in left hip   2. Stiffness of left hip, not elsewhere classified   3. Chronic left-sided low back pain, unspecified whether sciatica present        Problem List Patient Active Problem List   Diagnosis Date Noted  . Lumbar disc disease with radiculopathy 05/22/2018  . Cutaneous candidiasis 05/22/2018  . Dysuria 05/22/2018  . Acute pharyngitis 04/02/2018  . Sore throat 04/02/2018  . Pain in joint of right knee 01/29/2018  . Chest pain 10/17/2017  . SOB (shortness of breath) 10/17/2017  . Vasomotor symptoms due to menopause 10/17/2017  . Routine cervical smear 10/17/2017  . Mixed hyperlipidemia 01/05/2017  . Essential hypertension 12/17/2014  . Obesity  12/17/2014  . Obstructive sleep apnea 12/17/2014  . Proteinuria 12/17/2014    [name], PT DPT 06/22/2018, 1:05 PM  [place] [facility] [address], [place], [address] Phone: [phone]   Fax:  [phone]  Name: Jamie Powers MRN: [id number] Date of Birth: 05/16/1955

## 2018-06-27 ENCOUNTER — Other Ambulatory Visit: Payer: Self-pay

## 2018-06-27 ENCOUNTER — Ambulatory Visit: Payer: BLUE CROSS/BLUE SHIELD

## 2018-06-27 DIAGNOSIS — M25652 Stiffness of left hip, not elsewhere classified: Secondary | ICD-10-CM

## 2018-06-27 DIAGNOSIS — M25552 Pain in left hip: Secondary | ICD-10-CM | POA: Diagnosis not present

## 2018-06-27 NOTE — Therapy (Signed)
[place] [facility] [address] [place], [address] Phone: [phone]   Fax:  [phone]  Physical Therapy Treatment  Patient Details  Name: Jamie Powers MRN: [id number] Date of Birth: 1955/07/16 Referring Provider (PT): Humphrey Rolls MD   Encounter Date: 06/27/2018  PT End of Session - 06/27/18 1117    Visit Number  8    Number of Visits  13    Date for PT Re-Evaluation  07/11/18    PT Start Time  1103    PT Stop Time  1145    PT Time Calculation (min)  42 min    Activity Tolerance  Patient tolerated treatment well    Behavior During Therapy  [facility] for tasks assessed/performed       Past Medical History:  Diagnosis Date  . Hypertension   . Obesity   . Protein in urine   . Sleep apnea     Past Surgical History:  Procedure Laterality Date  . BREAST BIOPSY Left 2000   neg  . BREAST SURGERY    . CHOLECYSTECTOMY    . COLONOSCOPY WITH PROPOFOL N/A 01/31/2015   Procedure: COLONOSCOPY WITH PROPOFOL;  Surgeon: Lollie Sails, MD;  Location: [facility] ENDOSCOPY;  Service: Endoscopy;  Laterality: N/A;  . TONSILLECTOMY      There were no vitals filed for this visit.  Subjective Assessment - 06/27/18 1116    Subjective  Patient reports she's had little to no hip or knee pain since the previous session other than a soreness in the R quad.    Pertinent History  Increased pain along R knee, past increased pain for past 10 years    How long can you walk comfortably?  5-10 min    Patient Stated Goals  To be able to walk up and down the stairs without increase in pain    Currently in Pain?  No/denies    Pain Onset  More than a month ago            TREATMENT Therapeutic Exercise Standing knee flexion stretch - x 10 on the R with 5 sec holds Lumbar/hip extension in standing - x 20 CKC Lumbar rotation in standing with pipe behind back - x 2 min Hip abduction with RTB around knees - 3 x 8 with UE support Hip extension  in standing with RTB around knees - x 3 x 8 B Leg Press with B Les; unilaterally B LE - 55# x 20; unilaterally - 25# x20 Running man throughout a shortened AROM with UE support - 2 x 5 Ascending/descending the stairs - x 10 step over step with UE support TKE off of 8" stair - x20     Patient demonstrates no increase in pain throughout the session.  Performed exercises to decrease pain throughout the hip and lumbar spine.   PT Education - 06/27/18 1117    Education Details  form/technique with exercise    Person(s) Educated  Patient    Methods  Explanation;Demonstration    Comprehension  Verbalized understanding;Returned demonstration          PT Long Term Goals - 05/30/18 1232      PT LONG TERM GOAL #1   Title  Patient will be independent with HEP to continue benefits of therapy after discharge.    Baseline  Dependent with form and technique    Time  6    Period  Weeks    Status  New  Target Date  07/11/18      PT LONG TERM GOAL #2   Title  Patient will be able to walk for 1 hour to better be able to perform traveling activities without increase in pain    Baseline  able to walk 5-10 min    Time  6    Period  Weeks    Status  New    Target Date  07/11/18      PT LONG TERM GOAL #3   Title  Patient will be able to ascend/desend stairs without increase in pain and without side stepping the movement.     Baseline  Able to improve side stepping     Time  6    Period  Weeks    Status  New    Target Date  07/11/18            Plan - 06/27/18 1123    Clinical Impression Statement  Patient demonstrates significant improvement in LE strength and pain compared to previous sessions with ability to perform CKC exercises on the R LE without increase in pain. Although patient is improving, she continues to have increase in knee pain at end range knee flexion and decreased hip strength overall. Patient will benefit from further skilled therapy to return to prior level of  function.    Personal Factors and Comorbidities  Age;Behavior Pattern;Comorbidity 1    Comorbidities  Chronic pain, previous knee pain    Examination-Activity Limitations  Bend;Lift;Caring for Others;Carry    Examination-Participation Restrictions  Yard Work;Cleaning;Community Activity    Stability/Clinical Decision Making  Stable/Uncomplicated    Rehab Potential  Fair    PT Frequency  2x / week    PT Duration  6 weeks    PT Treatment/Interventions  Therapeutic activities;Therapeutic exercise;Balance training;Functional mobility training;Moist Heat;Cryotherapy;Patient/family education;Manual techniques;Dry needling;Passive range of motion;Spinal Manipulations;Joint Manipulations    PT Next Visit Plan  continue to progress exercises       Patient will benefit from skilled therapeutic intervention in order to improve the following deficits and impairments:  Increased muscle spasms, Difficulty walking, Impaired sensation, Decreased range of motion, Decreased strength, Decreased activity tolerance, Decreased balance, Decreased endurance, Decreased coordination  Visit Diagnosis: 1. Pain in left hip   2. Stiffness of left hip, not elsewhere classified        Problem List Patient Active Problem List   Diagnosis Date Noted  . Lumbar disc disease with radiculopathy 05/22/2018  . Cutaneous candidiasis 05/22/2018  . Dysuria 05/22/2018  . Acute pharyngitis 04/02/2018  . Sore throat 04/02/2018  . Pain in joint of right knee 01/29/2018  . Chest pain 10/17/2017  . SOB (shortness of breath) 10/17/2017  . Vasomotor symptoms due to menopause 10/17/2017  . Routine cervical smear 10/17/2017  . Mixed hyperlipidemia 01/05/2017  . Essential hypertension 12/17/2014  . Obesity 12/17/2014  . Obstructive sleep apnea 12/17/2014  . Proteinuria 12/17/2014    Jamie Powers, PT DPT 06/27/2018, 11:33 AM  [place] [facility] [address], [place], [address] Phone: [phone]   Fax:  [phone]  Name: Jamie Powers MRN: [id number] Date of Birth: 1955/10/23

## 2018-06-29 ENCOUNTER — Ambulatory Visit: Payer: BLUE CROSS/BLUE SHIELD

## 2018-06-29 ENCOUNTER — Other Ambulatory Visit: Payer: Self-pay

## 2018-06-29 DIAGNOSIS — M25652 Stiffness of left hip, not elsewhere classified: Secondary | ICD-10-CM

## 2018-06-29 DIAGNOSIS — M25552 Pain in left hip: Secondary | ICD-10-CM

## 2018-06-29 DIAGNOSIS — M545 Low back pain, unspecified: Secondary | ICD-10-CM

## 2018-06-29 DIAGNOSIS — G8929 Other chronic pain: Secondary | ICD-10-CM

## 2018-06-29 NOTE — Therapy (Signed)
[place] [facility] [address], [place], [address] Phone: [phone]   Fax:  [phone]  Physical Therapy Treatment  Patient Details  Name: Jamie Powers MRN: [id number] Date of Birth: 1955/11/23 Referring Provider (PT): Humphrey Rolls MD   Encounter Date: 06/29/2018  PT End of Session - 06/29/18 1115    Visit Number  9    Number of Visits  13    Date for PT Re-Evaluation  07/11/18    PT Start Time  1100    PT Stop Time  1145    PT Time Calculation (min)  45 min    Activity Tolerance  Patient tolerated treatment well    Behavior During Therapy  [facility] for tasks assessed/performed       Past Medical History:  Diagnosis Date  . Hypertension   . Obesity   . Protein in urine   . Sleep apnea     Past Surgical History:  Procedure Laterality Date  . BREAST BIOPSY Left 2000   neg  . BREAST SURGERY    . CHOLECYSTECTOMY    . COLONOSCOPY WITH PROPOFOL N/A 01/31/2015   Procedure: COLONOSCOPY WITH PROPOFOL;  Surgeon: Lollie Sails, MD;  Location: [facility] ENDOSCOPY;  Service: Endoscopy;  Laterality: N/A;  . TONSILLECTOMY      There were no vitals filed for this visit.  Subjective Assessment - 06/29/18 1104    Subjective  Patient reports she's been pain free for the past week and has been feeling much better overall.    Pertinent History  Increased pain along R knee, past increased pain for past 10 years    How long can you walk comfortably?  5-10 min    Patient Stated Goals  To be able to walk up and down the stairs without increase in pain    Currently in Pain?  No/denies    Pain Onset  More than a month ago         TREATMENT Therapeutic Exercise  Hip abduction in standing with GTB around knees - x 3 x 8 B Standing knee flexion stretch - x 10 on the R with 5 sec holds Lumbar/hip extension in standing - x 20 CKC Hip extension in standing with GTB around knees - x 3 x 8 B Lumbar rotation in standing with pipe  behind back - x 2 min Leg Press with B Les; unilaterally B LE - 55# x 30; unilaterally - 25# x30 Running man throughout a shortened AROM with UE support - 2 x 5 Ascending/descending the stairs - x 10 step over step with UE support TKE off of 8" stair - x20 Wall sitting squats in standing - x 20        Patient demonstrates no increase in pain throughout the session.  Performed exercises to decrease pain throughout the hip and lumbar spine.   PT Education - 06/29/18 1111    Education Details  form/technique with exercise    Person(s) Educated  Patient    Methods  Explanation;Demonstration    Comprehension  Verbalized understanding;Returned demonstration          PT Long Term Goals - 05/30/18 1232      PT LONG TERM GOAL #1   Title  Patient will be independent with HEP to continue benefits of therapy after discharge.    Baseline  Dependent with form and technique    Time  6    Period  Weeks    Status  New    Target Date  07/11/18      PT LONG TERM GOAL #2   Title  Patient will be able to walk for 1 hour to better be able to perform traveling activities without increase in pain    Baseline  able to walk 5-10 min    Time  6    Period  Weeks    Status  New    Target Date  07/11/18      PT LONG TERM GOAL #3   Title  Patient will be able to ascend/desend stairs without increase in pain and without side stepping the movement.     Baseline  Able to improve side stepping     Time  6    Period  Weeks    Status  New    Target Date  07/11/18            Plan - 06/29/18 1248    Clinical Impression Statement  Patient continues to improve and is able to tolerate a greater amount of exercises compared to previous sessions indicating functional carryover. Although patient is improving, she continues to have difficulty with performing knee flexion under weight bearing positions. Patient will benefit from further skilled therapy to strengthen LEs and return to prior level of  function.    Personal Factors and Comorbidities  Age;Behavior Pattern;Comorbidity 1    Comorbidities  Chronic pain, previous knee pain    Examination-Activity Limitations  Bend;Lift;Caring for Others;Carry    Examination-Participation Restrictions  Yard Work;Cleaning;Community Activity    Stability/Clinical Decision Making  Stable/Uncomplicated    Rehab Potential  Fair    PT Frequency  2x / week    PT Duration  6 weeks    PT Treatment/Interventions  Therapeutic activities;Therapeutic exercise;Balance training;Functional mobility training;Moist Heat;Cryotherapy;Patient/family education;Manual techniques;Dry needling;Passive range of motion;Spinal Manipulations;Joint Manipulations    PT Next Visit Plan  continue to progress exercises       Patient will benefit from skilled therapeutic intervention in order to improve the following deficits and impairments:  Increased muscle spasms, Difficulty walking, Impaired sensation, Decreased range of motion, Decreased strength, Decreased activity tolerance, Decreased balance, Decreased endurance, Decreased coordination  Visit Diagnosis: 1. Pain in left hip   2. Stiffness of left hip, not elsewhere classified   3. Chronic left-sided low back pain, unspecified whether sciatica present        Problem List Patient Active Problem List   Diagnosis Date Noted  . Lumbar disc disease with radiculopathy 05/22/2018  . Cutaneous candidiasis 05/22/2018  . Dysuria 05/22/2018  . Acute pharyngitis 04/02/2018  . Sore throat 04/02/2018  . Pain in joint of right knee 01/29/2018  . Chest pain 10/17/2017  . SOB (shortness of breath) 10/17/2017  . Vasomotor symptoms due to menopause 10/17/2017  . Routine cervical smear 10/17/2017  . Mixed hyperlipidemia 01/05/2017  . Essential hypertension 12/17/2014  . Obesity 12/17/2014  . Obstructive sleep apnea 12/17/2014  . Proteinuria 12/17/2014    [name], PT DPT 06/29/2018, 12:50 PM  [place] [facility] [address] [place], [address] Phone: [phone]   Fax:  [phone]  Name: Suleyma Wafer MRN: [id number] Date of Birth: 1955/10/10

## 2018-07-04 ENCOUNTER — Ambulatory Visit: Payer: BLUE CROSS/BLUE SHIELD

## 2018-07-04 ENCOUNTER — Other Ambulatory Visit: Payer: Self-pay

## 2018-07-04 DIAGNOSIS — M25552 Pain in left hip: Secondary | ICD-10-CM | POA: Diagnosis not present

## 2018-07-04 DIAGNOSIS — M25652 Stiffness of left hip, not elsewhere classified: Secondary | ICD-10-CM

## 2018-07-04 NOTE — Therapy (Signed)
[facility] [address] [place], [address] Phone: [phone]   Fax:  [phone]  Physical Therapy Treatment  Patient Details  Name: Jamie Powers MRN: [id number] Date of Birth: October 06, 1955 Referring Provider (PT): Humphrey Rolls MD   Encounter Date: 07/04/2018  PT End of Session - 07/04/18 1138    Visit Number  10    Number of Visits  13    Date for PT Re-Evaluation  07/11/18    PT Start Time  1100    PT Stop Time  1130    PT Time Calculation (min)  30 min    Activity Tolerance  Patient tolerated treatment well    Behavior During Therapy  [facility] for tasks assessed/performed       Past Medical History:  Diagnosis Date  . Hypertension   . Obesity   . Protein in urine   . Sleep apnea     Past Surgical History:  Procedure Laterality Date  . BREAST BIOPSY Left 2000   neg  . BREAST SURGERY    . CHOLECYSTECTOMY    . COLONOSCOPY WITH PROPOFOL N/A 01/31/2015   Procedure: COLONOSCOPY WITH PROPOFOL;  Surgeon: Lollie Sails, MD;  Location: [facility] ENDOSCOPY;  Service: Endoscopy;  Laterality: N/A;  . TONSILLECTOMY      There were no vitals filed for this visit.  Subjective Assessment - 07/04/18 1137    Subjective  Patient reports no increase in pain for the past 2 weeks and states she is prepared for discharge.    Pertinent History  Increased pain along R knee, past increased pain for past 10 years    How long can you walk comfortably?  5-10 min    Patient Stated Goals  To be able to walk up and down the stairs without increase in pain    Currently in Pain?  No/denies    Pain Onset  More than a month ago        TREATMENT Therapeutic Exercise                                            Monster walks in standing with RTB around knees -- x 20 Standing knee flexion stretch -Reviewed with use of belt Lumbar/hip extension in standing -x 20 CKC Hip abduction in standing -- x 20 Hip extension in standing  -x 3  x 8 B Single leg hip rotation in standing -x 10B  Squats with use of chair -- x 10 with UE supprot    Patient demonstratesno increase in pain throughout the session.Performed exercises to decrease pain throughout the hip and lumbar spine.   PT Education - 07/04/18 1138    Education Details  Reviewed and updated HEP for home    Person(s) Educated  Patient    Methods  Demonstration;Explanation;Verbal cues;Handout    Comprehension  Returned demonstration;Verbalized understanding          PT Long Term Goals - 07/04/18 1141      PT LONG TERM GOAL #1   Title  Patient will be independent with HEP to continue benefits of therapy after discharge.    Baseline  Dependent with form and technique; 07/04/18: independent with HEP    Time  6    Period  Weeks    Status  Achieved      PT LONG TERM GOAL #2  Title  Patient will be able to walk for 1 hour to better be able to perform traveling activities without increase in pain    Baseline  able to walk 5-10 min; 07/04/2018: Able to walk as long as she wants    Time  6    Period  Weeks    Status  Achieved      PT LONG TERM GOAL #3   Title  Patient will be able to ascend/desend stairs without increase in pain and without side stepping the movement.     Baseline  Able to improve side stepping; can asend and desend stairs without increase in pain    Time  6    Period  Weeks    Status  Achieved            Plan - 07/04/18 1139    Clinical Impression Statement  Patient is able to perform exercises with little to no cueing involved indicating functional carryover and improvement in overall motor control. Only onset of 'discomfort' is during deep knee bends on the R knee, which resolves out of position. Gave patient exercises to address this limitation at home. Patient has met all long term goals and is to be discharged from physical therapy.    Personal Factors and Comorbidities  Age;Behavior Pattern;Comorbidity 1    Comorbidities   Chronic pain, previous knee pain    Examination-Activity Limitations  Bend;Lift;Caring for Others;Carry    Examination-Participation Restrictions  Yard Work;Cleaning;Community Activity    Stability/Clinical Decision Making  Stable/Uncomplicated    Rehab Potential  Fair    PT Frequency  2x / week    PT Duration  6 weeks    PT Treatment/Interventions  Therapeutic activities;Therapeutic exercise;Balance training;Functional mobility training;Moist Heat;Cryotherapy;Patient/family education;Manual techniques;Dry needling;Passive range of motion;Spinal Manipulations;Joint Manipulations    PT Next Visit Plan  continue to progress exercises       Patient will benefit from skilled therapeutic intervention in order to improve the following deficits and impairments:  Increased muscle spasms, Difficulty walking, Impaired sensation, Decreased range of motion, Decreased strength, Decreased activity tolerance, Decreased balance, Decreased endurance, Decreased coordination  Visit Diagnosis: 1. Pain in left hip   2. Stiffness of left hip, not elsewhere classified        Problem List Patient Active Problem List   Diagnosis Date Noted  . Lumbar disc disease with radiculopathy 05/22/2018  . Cutaneous candidiasis 05/22/2018  . Dysuria 05/22/2018  . Acute pharyngitis 04/02/2018  . Sore throat 04/02/2018  . Pain in joint of right knee 01/29/2018  . Chest pain 10/17/2017  . SOB (shortness of breath) 10/17/2017  . Vasomotor symptoms due to menopause 10/17/2017  . Routine cervical smear 10/17/2017  . Mixed hyperlipidemia 01/05/2017  . Essential hypertension 12/17/2014  . Obesity 12/17/2014  . Obstructive sleep apnea 12/17/2014  . Proteinuria 12/17/2014    [name], PT DPT 07/04/2018, 11:43 AM  [place] [facility] [address], [place], [address] Phone: [phone]   Fax:  [phone]  Name: Jamie Powers MRN:  [id number] Date of Birth: 07/03/1955

## 2018-07-11 ENCOUNTER — Ambulatory Visit: Payer: BLUE CROSS/BLUE SHIELD

## 2018-07-13 ENCOUNTER — Other Ambulatory Visit: Payer: Self-pay | Admitting: Adult Health

## 2018-07-13 DIAGNOSIS — K219 Gastro-esophageal reflux disease without esophagitis: Secondary | ICD-10-CM

## 2018-09-25 ENCOUNTER — Ambulatory Visit: Payer: BLUE CROSS/BLUE SHIELD | Admitting: Nurse Practitioner

## 2018-09-29 ENCOUNTER — Other Ambulatory Visit: Payer: Self-pay | Admitting: Nurse Practitioner

## 2018-09-29 DIAGNOSIS — E559 Vitamin D deficiency, unspecified: Secondary | ICD-10-CM

## 2018-10-03 ENCOUNTER — Other Ambulatory Visit: Payer: Self-pay

## 2018-10-03 DIAGNOSIS — E559 Vitamin D deficiency, unspecified: Secondary | ICD-10-CM

## 2018-10-03 MED ORDER — ERGOCALCIFEROL 1.25 MG (50000 UT) PO CAPS: 50000.0000 [IU] | ORAL_CAPSULE | ORAL | 5 refills | Status: DC

## 2018-10-06 ENCOUNTER — Other Ambulatory Visit: Payer: Self-pay

## 2018-10-06 ENCOUNTER — Encounter: Payer: Self-pay | Admitting: Nurse Practitioner

## 2018-10-06 ENCOUNTER — Ambulatory Visit (INDEPENDENT_AMBULATORY_CARE_PROVIDER_SITE_OTHER): Payer: BLUE CROSS/BLUE SHIELD | Admitting: Nurse Practitioner

## 2018-10-06 VITALS — BP 137/91 | HR 74 | Temp 97.4°F | Resp 16 | Ht 60.0 in | Wt 207.0 lb

## 2018-10-06 DIAGNOSIS — I10 Essential (primary) hypertension: Secondary | ICD-10-CM

## 2018-10-06 DIAGNOSIS — K219 Gastro-esophageal reflux disease without esophagitis: Secondary | ICD-10-CM

## 2018-10-06 DIAGNOSIS — D509 Iron deficiency anemia, unspecified: Secondary | ICD-10-CM

## 2018-10-06 DIAGNOSIS — E538 Deficiency of other specified B group vitamins: Secondary | ICD-10-CM | POA: Diagnosis not present

## 2018-10-06 DIAGNOSIS — Z1231 Encounter for screening mammogram for malignant neoplasm of breast: Secondary | ICD-10-CM

## 2018-10-06 DIAGNOSIS — Z1159 Encounter for screening for other viral diseases: Secondary | ICD-10-CM

## 2018-10-06 MED ORDER — PANTOPRAZOLE SODIUM 40 MG PO TBEC: 40.0000 mg | DELAYED_RELEASE_TABLET | Freq: Every day | ORAL | 3 refills | Status: DC

## 2018-10-06 NOTE — Progress Notes (Signed)
[facility] [place], [place] [address]  Internal MEDICINE  Office Visit Note  Patient Name: Jamie Powers  [id number]  ID:[id number]  Date of Service: 10/15/2018  Chief Complaint  Patient presents with  . Follow-up    recheck vit d levels  . Hypertension    The patient is here for routine follow up visit.she is due to have screening mammogram. She has no concerns or complaints.   Hypertension This is a chronic problem. The problem is unchanged. The problem is controlled. Pertinent negatives include no chest pain, headaches, neck pain, palpitations or shortness of breath. There are no associated agents to hypertension. Risk factors for coronary artery disease include dyslipidemia and post-menopausal state. Past treatments include angiotensin blockers. The current treatment provides moderate improvement. Compliance problems include exercise.        Current Medication: Outpatient Encounter Medications as of 10/06/2018  Medication Sig Note  . AFLURIA QUADRIVALENT 0.5 ML injection TO BE ADMINISTERED BY PHARMACIST FOR IMMUNIZATION   . aspirin EC 81 MG tablet Take 81 mg by mouth daily.   . cetirizine (ZYRTEC) 10 MG tablet Take 1 tablet (10 mg total) by mouth daily.   . colchicine 0.6 MG tablet Take 2 tablets initially, and then one tablet an hour later.  Wait three days and repeat dosing as needed for flares   . diclofenac sodium (VOLTAREN) 1 % GEL Apply 4 g topically 4 (four) times daily.   . ergocalciferol (DRISDOL) 1.25 MG (50000 UT) capsule Take 1 capsule (50,000 Units total) by mouth once a week.   . ferrous sulfate 325 (65 FE) MG tablet Take 325 mg by mouth daily.   . Fluoxetine HCl, PMDD, 10 MG TABS TAKE 1 TABLET BY MOUTH EVERY DAY   . hydrocortisone 2.5 % cream Apply 1 application topically as needed.   . nystatin cream (MYCOSTATIN) Apply topically 3 (three) times daily.   . [DISCONTINUED] losartan (COZAAR) 50 MG tablet Take 1 tablet (50 mg total) by  mouth daily.   . [DISCONTINUED] omeprazole (PRILOSEC) 40 MG capsule TAKE 1 CAPSULE BY MOUTH EVERY DAY 10/06/2018: changed to pantoprazole per provider while in [place]  . pantoprazole (PROTONIX) 40 MG tablet Take 1 tablet (40 mg total) by mouth daily.    No facility-administered encounter medications on file as of 10/06/2018.     Surgical History: Past Surgical History:  Procedure Laterality Date  . BREAST BIOPSY Left 2000   neg  . BREAST SURGERY    . CHOLECYSTECTOMY    . COLONOSCOPY WITH PROPOFOL N/A 01/31/2015   Procedure: COLONOSCOPY WITH PROPOFOL;  Surgeon: Lollie Sails, MD;  Location: [facility] ENDOSCOPY;  Service: Endoscopy;  Laterality: N/A;  . TONSILLECTOMY      Medical History: Past Medical History:  Diagnosis Date  . Hypertension   . Obesity   . Protein in urine   . Sleep apnea     Family History: Family History  Problem Relation Age of Onset  . Breast cancer Paternal Aunt   . Hypertension Mother   . Heart disease Father     Social History   Socioeconomic History  . Marital status: Married    Spouse name: Not on file  . Number of children: Not on file  . Years of education: Not on file  . Highest education level: Not on file  Occupational History  . Not on file  Social Needs  . Financial resource strain: Not on file  . Food insecurity    Worry: Not  on file    Inability: Not on file  . Transportation needs    Medical: Not on file    Non-medical: Not on file  Tobacco Use  . Smoking status: Never Smoker  . Smokeless tobacco: Never Used  Substance and Sexual Activity  . Alcohol use: No  . Drug use: No  . Sexual activity: Not on file  Lifestyle  . Physical activity    Days per week: Not on file    Minutes per session: Not on file  . Stress: Not on file  Relationships  . Social Herbalist on phone: Not on file    Gets together: Not on file    Attends religious service: Not on file    Active member of club or organization: Not on file     Attends meetings of clubs or organizations: Not on file    Relationship status: Not on file  . Intimate partner violence    Fear of current or ex partner: Not on file    Emotionally abused: Not on file    Physically abused: Not on file    Forced sexual activity: Not on file  Other Topics Concern  . Not on file  Social History Narrative  . Not on file      Review of Systems  Constitutional: Negative for activity change, chills, fatigue and unexpected weight change.  HENT: Negative for congestion, postnasal drip, rhinorrhea, sneezing and sore throat.   Respiratory: Negative for cough, chest tightness, shortness of breath and wheezing.   Cardiovascular: Negative for chest pain and palpitations.  Gastrointestinal: Negative for abdominal pain, constipation, diarrhea, nausea and vomiting.  Endocrine: Negative for cold intolerance, heat intolerance, polydipsia and polyuria.  Musculoskeletal: Negative for back pain, joint swelling and neck pain.  Skin: Negative for rash.  Allergic/Immunologic: Negative for environmental allergies.  Neurological: Negative for dizziness, tremors, numbness and headaches.  Hematological: Negative for adenopathy. Does not bruise/bleed easily.  Psychiatric/Behavioral: Negative for behavioral problems (Depression), dysphoric mood, sleep disturbance and suicidal ideas. The patient is nervous/anxious.     Today's Vitals   10/06/18 1133  BP: (!) 137/91  Pulse: 74  Resp: 16  Temp: (!) 97.4 F (36.3 C)  SpO2: 97%  Weight: 207 lb (93.9 kg)  Height: 5' (1.524 m)   Body mass index is 40.43 kg/m.  Physical Exam Vitals signs and nursing note reviewed.  Constitutional:      General: She is not in acute distress.    Appearance: Normal appearance. She is well-developed. She is not diaphoretic.  HENT:     Head: Normocephalic and atraumatic.     Nose: Nose normal.     Mouth/Throat:     Pharynx: No oropharyngeal exudate.  Eyes:     Conjunctiva/sclera:  Conjunctivae normal.     Pupils: Pupils are equal, round, and reactive to light.  Neck:     Musculoskeletal: Normal range of motion and neck supple.     Thyroid: No thyromegaly.     Vascular: No carotid bruit or JVD.     Trachea: No tracheal deviation.  Cardiovascular:     Rate and Rhythm: Normal rate and regular rhythm.     Heart sounds: Normal heart sounds. No murmur. No friction rub. No gallop.   Pulmonary:     Effort: Pulmonary effort is normal. No respiratory distress.     Breath sounds: Normal breath sounds. No wheezing or rales.  Chest:     Chest wall: No tenderness.  Abdominal:  Palpations: Abdomen is soft.     Tenderness: There is no abdominal tenderness.  Musculoskeletal: Normal range of motion.  Lymphadenopathy:     Cervical: No cervical adenopathy.  Skin:    General: Skin is warm and dry.  Neurological:     Mental Status: She is alert and oriented to person, place, and time.     Cranial Nerves: No cranial nerve deficit.  Psychiatric:        Behavior: Behavior normal.        Thought Content: Thought content normal.        Judgment: Judgment normal.    Assessment/Plan: 1. Essential hypertension Stable. Continue bp medication as prescribed   2. Gastroesophageal reflux disease without esophagitis - pantoprazole (PROTONIX) 40 MG tablet; Take 1 tablet (40 mg total) by mouth daily.  Dispense: 30 tablet; Refill: 3  3. Iron deficiency anemia, unspecified iron deficiency anemia type Check labs with full anemia panel.   4. Vitamin B12 deficiency Check labs with full anemia panel.   5. Encounter for hepatitis C screening test for low risk patient Hep C screening with routine labs.   6. Encounter for screening mammogram for malignant neoplasm of breast - MM DIGITAL SCREENING BILATERAL; Future  General Counseling: laterica yung understanding of the findings of todays visit and agrees with plan of treatment. I have discussed any further diagnostic evaluation  that may be needed or ordered today. We also reviewed her medications today. she has been encouraged to call the office with any questions or concerns that should arise related to todays visit.  This patient was seen by Leretha Pol FNP Collaboration with Dr Lavera Guise as a part of collaborative care agreement  Orders Placed This Encounter  Procedures  . MM DIGITAL SCREENING BILATERAL    Meds ordered this encounter  Medications  . pantoprazole (PROTONIX) 40 MG tablet    Sig: Take 1 tablet (40 mg total) by mouth daily.    Dispense:  30 tablet    Refill:  3    Order Specific Question:   Supervising Provider    Answer:   Lavera Guise [id number]    Time spent: 42 Minutes      Dr Lavera Guise Internal medicine

## 2018-10-09 ENCOUNTER — Other Ambulatory Visit: Payer: Self-pay | Admitting: Nurse Practitioner

## 2018-10-10 LAB — IRON AND TIBC
Iron Saturation: 23 % (ref 15–55)
Iron: 63 ug/dL (ref 27–139)
Total Iron Binding Capacity: 277 ug/dL (ref 250–450)
UIBC: 214 ug/dL (ref 118–369)

## 2018-10-10 LAB — B12 AND FOLATE PANEL
Folate: 10.8 ng/mL (ref >3.0)
Vitamin B-12: 495 pg/mL (ref 232–1245)

## 2018-10-10 LAB — CBC
Hematocrit: 38.5 % (ref 34.0–46.6)
Hemoglobin: 12.5 g/dL (ref 11.1–15.9)
MCH: 29 pg (ref 26.6–33.0)
MCHC: 32.5 g/dL (ref 31.5–35.7)
MCV: 89 fL (ref 79–97)
Platelets: 362 10*3/uL (ref 150–450)
RBC: 4.31 x10E6/uL (ref 3.77–5.28)
RDW: 11.8 % (ref 11.7–15.4)
WBC: 6.9 10*3/uL (ref 3.4–10.8)

## 2018-10-10 LAB — FERRITIN: Ferritin: 111 ng/mL (ref 15–150)

## 2018-10-10 LAB — VITAMIN D 25 HYDROXY (VIT D DEFICIENCY, FRACTURES): Vit D, 25-Hydroxy: 40 ng/mL (ref 30.0–100.0)

## 2018-10-10 LAB — HCV AB W REFLEX TO QUANT PCR: HCV Ab: 0.1 s/co ratio (ref 0.0–0.9)

## 2018-10-10 LAB — HCV INTERPRETATION

## 2018-10-11 ENCOUNTER — Other Ambulatory Visit: Payer: Self-pay

## 2018-10-11 DIAGNOSIS — I10 Essential (primary) hypertension: Secondary | ICD-10-CM

## 2018-10-11 MED ORDER — LOSARTAN POTASSIUM 50 MG PO TABS: 50.0000 mg | ORAL_TABLET | Freq: Every day | ORAL | 3 refills | Status: DC

## 2018-10-11 NOTE — Progress Notes (Signed)
All labs good,normal.

## 2018-10-15 DIAGNOSIS — D509 Iron deficiency anemia, unspecified: Secondary | ICD-10-CM | POA: Insufficient documentation

## 2018-10-15 DIAGNOSIS — Z1159 Encounter for screening for other viral diseases: Secondary | ICD-10-CM | POA: Insufficient documentation

## 2018-10-15 DIAGNOSIS — E538 Deficiency of other specified B group vitamins: Secondary | ICD-10-CM | POA: Insufficient documentation

## 2018-10-15 DIAGNOSIS — Z1231 Encounter for screening mammogram for malignant neoplasm of breast: Secondary | ICD-10-CM | POA: Insufficient documentation

## 2018-10-15 DIAGNOSIS — K219 Gastro-esophageal reflux disease without esophagitis: Secondary | ICD-10-CM | POA: Insufficient documentation

## 2019-01-03 ENCOUNTER — Other Ambulatory Visit: Payer: Self-pay

## 2019-01-03 DIAGNOSIS — K219 Gastro-esophageal reflux disease without esophagitis: Secondary | ICD-10-CM

## 2019-01-03 MED ORDER — PANTOPRAZOLE SODIUM 40 MG PO TBEC: 40.0000 mg | DELAYED_RELEASE_TABLET | Freq: Every day | ORAL | 3 refills | Status: DC

## 2019-03-01 ENCOUNTER — Telehealth: Payer: Self-pay

## 2019-03-01 NOTE — Telephone Encounter (Signed)
REQUESTED RECORDS FAXED TO [facility] AT [facility].

## 2019-03-05 DIAGNOSIS — N181 Chronic kidney disease, stage 1: Secondary | ICD-10-CM | POA: Diagnosis not present

## 2019-03-05 DIAGNOSIS — R809 Proteinuria, unspecified: Secondary | ICD-10-CM | POA: Diagnosis not present

## 2019-03-05 DIAGNOSIS — I10 Essential (primary) hypertension: Secondary | ICD-10-CM | POA: Diagnosis not present

## 2019-03-19 DIAGNOSIS — W1839XA Other fall on same level, initial encounter: Secondary | ICD-10-CM | POA: Diagnosis not present

## 2019-03-19 DIAGNOSIS — I10 Essential (primary) hypertension: Secondary | ICD-10-CM | POA: Diagnosis not present

## 2019-03-19 DIAGNOSIS — S4991XA Unspecified injury of right shoulder and upper arm, initial encounter: Secondary | ICD-10-CM | POA: Diagnosis not present

## 2019-03-19 DIAGNOSIS — Z880 Allergy status to penicillin: Secondary | ICD-10-CM | POA: Diagnosis not present

## 2019-03-19 DIAGNOSIS — M25511 Pain in right shoulder: Secondary | ICD-10-CM | POA: Diagnosis not present

## 2019-03-19 DIAGNOSIS — Z79899 Other long term (current) drug therapy: Secondary | ICD-10-CM | POA: Diagnosis not present

## 2019-03-19 DIAGNOSIS — M79621 Pain in right upper arm: Secondary | ICD-10-CM | POA: Diagnosis not present

## 2019-03-19 DIAGNOSIS — S43401A Unspecified sprain of right shoulder joint, initial encounter: Secondary | ICD-10-CM | POA: Diagnosis not present

## 2019-03-19 DIAGNOSIS — S40011A Contusion of right shoulder, initial encounter: Secondary | ICD-10-CM | POA: Diagnosis not present

## 2019-03-20 ENCOUNTER — Other Ambulatory Visit: Payer: Self-pay

## 2019-03-20 DIAGNOSIS — E559 Vitamin D deficiency, unspecified: Secondary | ICD-10-CM

## 2019-03-20 MED ORDER — ERGOCALCIFEROL 1.25 MG (50000 UT) PO CAPS: 50000.0000 [IU] | ORAL_CAPSULE | ORAL | 0 refills | Status: DC

## 2019-03-27 DIAGNOSIS — R7303 Prediabetes: Secondary | ICD-10-CM | POA: Diagnosis not present

## 2019-03-27 DIAGNOSIS — R809 Proteinuria, unspecified: Secondary | ICD-10-CM | POA: Diagnosis not present

## 2019-03-27 DIAGNOSIS — I10 Essential (primary) hypertension: Secondary | ICD-10-CM | POA: Diagnosis not present

## 2019-03-27 DIAGNOSIS — N181 Chronic kidney disease, stage 1: Secondary | ICD-10-CM | POA: Diagnosis not present

## 2019-04-05 ENCOUNTER — Encounter: Payer: BLUE CROSS/BLUE SHIELD | Admitting: Nurse Practitioner

## 2019-04-09 ENCOUNTER — Other Ambulatory Visit: Payer: Self-pay

## 2019-04-09 DIAGNOSIS — K219 Gastro-esophageal reflux disease without esophagitis: Secondary | ICD-10-CM

## 2019-04-09 MED ORDER — PANTOPRAZOLE SODIUM 40 MG PO TBEC: 40.0000 mg | DELAYED_RELEASE_TABLET | Freq: Every day | ORAL | 0 refills | Status: DC

## 2019-05-01 ENCOUNTER — Encounter: Payer: Medicare Other | Admitting: Nurse Practitioner

## 2019-06-07 ENCOUNTER — Ambulatory Visit (INDEPENDENT_AMBULATORY_CARE_PROVIDER_SITE_OTHER): Payer: Medicare Other | Admitting: Nurse Practitioner

## 2019-06-07 ENCOUNTER — Telehealth: Payer: Self-pay

## 2019-06-07 ENCOUNTER — Other Ambulatory Visit: Payer: Self-pay

## 2019-06-07 ENCOUNTER — Encounter: Payer: Self-pay | Admitting: Nurse Practitioner

## 2019-06-07 VITALS — BP 144/97 | HR 75 | Temp 97.3°F | Resp 16 | Ht 60.0 in | Wt 211.8 lb

## 2019-06-07 DIAGNOSIS — Z0001 Encounter for general adult medical examination with abnormal findings: Secondary | ICD-10-CM

## 2019-06-07 DIAGNOSIS — R300 Dysuria: Secondary | ICD-10-CM

## 2019-06-07 DIAGNOSIS — K219 Gastro-esophageal reflux disease without esophagitis: Secondary | ICD-10-CM

## 2019-06-07 DIAGNOSIS — N951 Menopausal and female climacteric states: Secondary | ICD-10-CM

## 2019-06-07 DIAGNOSIS — E559 Vitamin D deficiency, unspecified: Secondary | ICD-10-CM

## 2019-06-07 DIAGNOSIS — I10 Essential (primary) hypertension: Secondary | ICD-10-CM | POA: Diagnosis not present

## 2019-06-07 DIAGNOSIS — M25511 Pain in right shoulder: Secondary | ICD-10-CM | POA: Diagnosis not present

## 2019-06-07 MED ORDER — PANTOPRAZOLE SODIUM 40 MG PO TBEC: 40.0000 mg | DELAYED_RELEASE_TABLET | Freq: Every day | ORAL | 3 refills | Status: DC

## 2019-06-07 MED ORDER — ERGOCALCIFEROL 1.25 MG (50000 UT) PO CAPS: 50000.0000 [IU] | ORAL_CAPSULE | ORAL | 3 refills | Status: DC

## 2019-06-07 MED ORDER — DICLOFENAC SODIUM 1 % EX GEL: 4.0000 g | Freq: Four times a day (QID) | CUTANEOUS | 2 refills | Status: DC

## 2019-06-07 MED ORDER — FLUOXETINE HCL (PMDD) 10 MG PO TABS: 1.0000 | ORAL_TABLET | Freq: Every day | ORAL | 3 refills | Status: DC

## 2019-06-07 MED ORDER — LOSARTAN POTASSIUM 50 MG PO TABS: 75.0000 mg | ORAL_TABLET | Freq: Every day | ORAL | 1 refills | Status: DC

## 2019-06-07 NOTE — Progress Notes (Signed)
[facility] [place], [place] [address]  Internal MEDICINE  Office Visit Note  Patient Name: Jamie Powers  [id number]  ID:[id number]  Date of Service: 06/13/2019   Pt is here for routine health maintenance examination   Chief Complaint  Patient presents with  . Annual Exam  . Hypertension  . Quality Metric Gaps    Dexa Scan, PNA Vaccine  . Arm Pain    Fell in March, still has pain in R arm      The patient is here for health maintenance exam. She states that she fell in March. She was on vacation, was seen In ER in [place], [place]. They did x-rays and told her she had sprain in her right shoulder. She continues to have moderate pain with ilmited ROM and strength. Can't pick her arm up to fix hair or to reach behind her to fasten her bra. It really hurts to lay down on her right side. This is interfering with her normal activities. She states the pain is some better than it was, but limitations are pretty significant.   Current Medication: Outpatient Encounter Medications as of 06/07/2019  Medication Sig  . cetirizine (ZYRTEC) 10 MG tablet Take 1 tablet (10 mg total) by mouth daily.  . ergocalciferol (DRISDOL) 1.25 MG (50000 UT) capsule Take 1 capsule (50,000 Units total) by mouth once a week.  . ferrous sulfate 325 (65 FE) MG tablet Take 325 mg by mouth daily.  . Fluoxetine HCl, PMDD, 10 MG TABS Take 1 tablet (10 mg total) by mouth daily.  . hydrocortisone 2.5 % cream Apply 1 application topically as needed.  Marland Kitchen losartan (COZAAR) 50 MG tablet Take 1.5 tablets (75 mg total) by mouth daily.  Marland Kitchen nystatin cream (MYCOSTATIN) Apply topically 3 (three) times daily.  . pantoprazole (PROTONIX) 40 MG tablet Take 1 tablet (40 mg total) by mouth daily.  . [DISCONTINUED] AFLURIA QUADRIVALENT 0.5 ML injection TO BE ADMINISTERED BY PHARMACIST FOR IMMUNIZATION  . [DISCONTINUED] aspirin EC 81 MG tablet Take 81 mg by mouth daily.  . [DISCONTINUED] colchicine 0.6 MG tablet Take 2  tablets initially, and then one tablet an hour later.  Wait three days and repeat dosing as needed for flares  . [DISCONTINUED] diclofenac sodium (VOLTAREN) 1 % GEL Apply 4 g topically 4 (four) times daily.  . [DISCONTINUED] ergocalciferol (DRISDOL) 1.25 MG (50000 UT) capsule Take 1 capsule (50,000 Units total) by mouth once a week.  . [DISCONTINUED] Fluoxetine HCl, PMDD, 10 MG TABS TAKE 1 TABLET BY MOUTH EVERY DAY  . [DISCONTINUED] losartan (COZAAR) 50 MG tablet Take 1 tablet (50 mg total) by mouth daily.  . [DISCONTINUED] pantoprazole (PROTONIX) 40 MG tablet Take 1 tablet (40 mg total) by mouth daily.  . diclofenac Sodium (VOLTAREN) 1 % GEL Apply 4 g topically 4 (four) times daily.   No facility-administered encounter medications on file as of 06/07/2019.    Surgical History: Past Surgical History:  Procedure Laterality Date  . BREAST BIOPSY Left 2000   neg  . BREAST SURGERY    . CHOLECYSTECTOMY    . COLONOSCOPY WITH PROPOFOL N/A 01/31/2015   Procedure: COLONOSCOPY WITH PROPOFOL;  Surgeon: Lollie Sails, MD;  Location: [facility] ENDOSCOPY;  Service: Endoscopy;  Laterality: N/A;  . TONSILLECTOMY      Medical History: Past Medical History:  Diagnosis Date  . Hypertension   . Obesity   . Protein in urine   . Sleep apnea     Family History: Family  History  Problem Relation Age of Onset  . Breast cancer Paternal Aunt   . Hypertension Mother   . Heart disease Father       Review of Systems  Constitutional: Negative for activity change, chills, fatigue and unexpected weight change.  HENT: Negative for congestion, postnasal drip, rhinorrhea, sneezing and sore throat.   Respiratory: Negative for cough, chest tightness, shortness of breath and wheezing.   Cardiovascular: Negative for chest pain and palpitations.  Gastrointestinal: Negative for abdominal pain, constipation, diarrhea, nausea and vomiting.  Endocrine: Negative for cold intolerance, heat intolerance, polydipsia and  polyuria.  Genitourinary: Negative for dysuria, frequency, pelvic pain and urgency.  Musculoskeletal: Positive for arthralgias and joint swelling. Negative for back pain and neck pain.       Right shoulder pain which is persistent and starting to interfere with her activities of daily living.   Skin: Negative for rash.  Allergic/Immunologic: Negative for environmental allergies.  Neurological: Negative for dizziness, tremors, numbness and headaches.  Hematological: Negative for adenopathy. Does not bruise/bleed easily.  Psychiatric/Behavioral: Negative for behavioral problems (Depression), dysphoric mood, sleep disturbance and suicidal ideas. The patient is nervous/anxious.    Today's Vitals   06/07/19 1529  BP: (!) 144/97  Pulse: 75  Resp: 16  Temp: (!) 97.3 F (36.3 C)  SpO2: 98%  Weight: 211 lb 12.8 oz (96.1 kg)  Height: 5' (1.524 m)   Body mass index is 41.36 kg/m.  Physical Exam Vitals and nursing note reviewed.  Constitutional:      General: She is not in acute distress.    Appearance: Normal appearance. She is well-developed. She is not diaphoretic.  HENT:     Head: Normocephalic and atraumatic.     Nose: Nose normal.     Mouth/Throat:     Pharynx: No oropharyngeal exudate.  Eyes:     Conjunctiva/sclera: Conjunctivae normal.     Pupils: Pupils are equal, round, and reactive to light.  Neck:     Thyroid: No thyromegaly.     Vascular: No carotid bruit or JVD.     Trachea: No tracheal deviation.  Cardiovascular:     Rate and Rhythm: Normal rate and regular rhythm.     Pulses: Normal pulses.     Heart sounds: Normal heart sounds. No murmur. No friction rub. No gallop.   Pulmonary:     Effort: Pulmonary effort is normal. No respiratory distress.     Breath sounds: Normal breath sounds. No wheezing or rales.  Chest:     Chest wall: No tenderness.     Breasts:        Right: Normal. No swelling, bleeding, inverted nipple, mass, nipple discharge, skin change or  tenderness.        Left: Normal. No swelling, bleeding, inverted nipple, mass, nipple discharge, skin change or tenderness.  Abdominal:     General: Bowel sounds are normal.     Palpations: Abdomen is soft.     Tenderness: There is no abdominal tenderness.  Musculoskeletal:     Right shoulder: Tenderness and crepitus present. Decreased range of motion.       Arms:     Cervical back: Normal range of motion and neck supple.  Lymphadenopathy:     Cervical: No cervical adenopathy.     Upper Body:     Right upper body: No axillary adenopathy.     Left upper body: No axillary adenopathy.  Skin:    General: Skin is warm and dry.  Neurological:  General: No focal deficit present.     Mental Status: She is alert and oriented to person, place, and time.     Cranial Nerves: No cranial nerve deficit.  Psychiatric:        Mood and Affect: Mood normal.        Behavior: Behavior normal.        Thought Content: Thought content normal.        Judgment: Judgment normal.    Depression screen [facility] 2/9 06/07/2019 10/06/2018 05/22/2018 03/31/2018 01/20/2018  Decreased Interest 0 0 0 0 0  Down, Depressed, Hopeless 0 0 0 0 0  PHQ - 2 Score 0 0 0 0 0    Functional Status Survey: Is the patient deaf or have difficulty hearing?: No Does the patient have difficulty seeing, even when wearing glasses/contacts?: No Does the patient have difficulty concentrating, remembering, or making decisions?: No Does the patient have difficulty walking or climbing stairs?: No Does the patient have difficulty dressing or bathing?: No Does the patient have difficulty doing errands alone such as visiting a doctor's office or shopping?: No  MMSE - [place] Exam 06/13/2019  Orientation to time 5  Orientation to Place 5  Registration 3  Attention/ Calculation 5  Recall 3  Language- name 2 objects 2  Language- repeat 1  Language- follow 3 step command 3  Language- read & follow direction 1  Write a sentence 1    Copy design 1  Total score 30    Fall Risk  06/07/2019 10/06/2018 03/31/2018 01/20/2018 10/17/2017  Falls in the past year? 1 0 0 0 No  Number falls in past yr: 0 - - - -  Injury with Fall? 1 - - - -  Comment Sprained right arm - - - -      LABS: Recent Results (from the past 2160 hour(s))  Urinalysis, Routine w reflex microscopic     Status: Abnormal   Collection Time: 06/07/19  4:09 PM  Result Value Ref Range   Specific Gravity, UA      >=1.030 (A) 1.005 - 1.030   pH, UA 5.5 5.0 - 7.5   Color, UA Yellow Yellow   Appearance Ur Clear Clear   Leukocytes,UA Negative Negative   Protein,UA 3+ (A) Negative/Trace   Glucose, UA Negative Negative   Ketones, UA Trace (A) Negative   RBC, UA Negative Negative   Bilirubin, UA Negative Negative   Urobilinogen, Ur 0.2 0.2 - 1.0 mg/dL   Nitrite, UA Negative Negative   Microscopic Examination See below:     Comment: Microscopic was indicated and was performed.  Microscopic Examination     Status: None   Collection Time: 06/07/19  4:09 PM   URINE  Result Value Ref Range   WBC, UA 0-5 0 - 5 /hpf   RBC None seen 0 - 2 /hpf   Epithelial Cells (non renal) 0-10 0 - 10 /hpf   Casts None seen None seen /lpf   Bacteria, UA None seen None seen/Few   Assessment/Plan: 1. Encounter for general adult medical examination with abnormal findings Annual health maintenance exam today.   2. Essential hypertension bp has been mildly elevated. Increase losartan 50mg  to 1.5 tablets daily. Continue to monitor closely at home.  - losartan (COZAAR) 50 MG tablet; Take 1.5 tablets (75 mg total) by mouth daily.  Dispense: 135 tablet; Refill: 1  3. Right shoulder pain, unspecified chronicity Will get x-ray results from ER visit in [place], [place]. May use  diclofenac gel on effected joint up to four times daily as needed for pain/inflammation. Refer to orthopedics for further evaluation and treatment . - Ambulatory referral to Orthopedic Surgery - diclofenac Sodium  (VOLTAREN) 1 % GEL; Apply 4 g topically 4 (four) times daily.  Dispense: 100 g; Refill: 2  4. Gastroesophageal reflux disease without esophagitis - pantoprazole (PROTONIX) 40 MG tablet; Take 1 tablet (40 mg total) by mouth daily.  Dispense: 90 tablet; Refill: 3  5. Vasomotor symptoms due to menopause Stable. Continue fluoxetine 10mg  daily.  - Fluoxetine HCl, PMDD, 10 MG TABS; Take 1 tablet (10 mg total) by mouth daily.  Dispense: 90 tablet; Refill: 3  6. Vitamin D deficiency Continue drisdol weekly.  - ergocalciferol (DRISDOL) 1.25 MG (50000 UT) capsule; Take 1 capsule (50,000 Units total) by mouth once a week.  Dispense: 4 capsule; Refill: 3  7. Dysuria - Urinalysis, Routine w reflex microscopic  General Counseling: Desirree verbalizes understanding of the findings of todays visit and agrees with plan of treatment. I have discussed any further diagnostic evaluation that may be needed or ordered today. We also reviewed her medications today. she has been encouraged to call the office with any questions or concerns that should arise related to todays visit.    Counseling:  Hypertension Counseling:   The following hypertensive lifestyle modification were recommended and discussed:  1. Limiting alcohol intake to less than 1 oz/day of ethanol:(24 oz of beer or 8 oz of wine or 2 oz of 100-proof whiskey). 2. Take baby ASA 81 mg daily. 3. Importance of regular aerobic exercise and losing weight. 4. Reduce dietary saturated fat and cholesterol intake for overall cardiovascular health. 5. Maintaining adequate dietary potassium, calcium, and magnesium intake. 6. Regular monitoring of the blood pressure. 7. Reduce sodium intake to less than 100 mmol/day (less than 2.3 gm of sodium or less than 6 gm of sodium choride)   This patient was seen by [place] with Dr Lavera Guise as a part of collaborative care agreement  Orders Placed This Encounter  Procedures  .  Microscopic Examination  . Urinalysis, Routine w reflex microscopic  . Ambulatory referral to Orthopedic Surgery    Meds ordered this encounter  Medications  . losartan (COZAAR) 50 MG tablet    Sig: Take 1.5 tablets (75 mg total) by mouth daily.    Dispense:  135 tablet    Refill:  1    Please note increase in dosing.    Order Specific Question:   Supervising Provider    Answer:   Lavera Guise [id number]  . ergocalciferol (DRISDOL) 1.25 MG (50000 UT) capsule    Sig: Take 1 capsule (50,000 Units total) by mouth once a week.    Dispense:  4 capsule    Refill:  3    Order Specific Question:   Supervising Provider    Answer:   Lavera Guise [id number]  . Fluoxetine HCl, PMDD, 10 MG TABS    Sig: Take 1 tablet (10 mg total) by mouth daily.    Dispense:  90 tablet    Refill:  3    Order Specific Question:   Supervising Provider    Answer:   Lavera Guise [id number]  . pantoprazole (PROTONIX) 40 MG tablet    Sig: Take 1 tablet (40 mg total) by mouth daily.    Dispense:  90 tablet    Refill:  3    Order Specific Question:   Supervising Provider  Answer:   Lavera Guise [place]  . diclofenac Sodium (VOLTAREN) 1 % GEL    Sig: Apply 4 g topically 4 (four) times daily.    Dispense:  100 g    Refill:  2    Order Specific Question:   Supervising Provider    Answer:   Lavera Guise [id number]    Total time spent: 35 Minutes  Time spent includes review of chart, medications, test results, and follow up plan with the patient.     Lavera Guise, MD  Internal Medicine

## 2019-06-07 NOTE — Telephone Encounter (Signed)
Confirmed and screened for 06-07-19 ov.

## 2019-06-08 ENCOUNTER — Telehealth: Payer: Self-pay

## 2019-06-08 LAB — URINALYSIS, ROUTINE W REFLEX MICROSCOPIC
Bilirubin, UA: NEGATIVE
Glucose, UA: NEGATIVE
Leukocytes,UA: NEGATIVE
Nitrite, UA: NEGATIVE
RBC, UA: NEGATIVE
Specific Gravity, UA: >=1.03 — AB (ref 1.005–1.030)
Urobilinogen, Ur: 0.2 mg/dL (ref 0.2–1.0)
pH, UA: 5.5 (ref 5.0–7.5)

## 2019-06-08 LAB — MICROSCOPIC EXAMINATION
Bacteria, UA: NONE SEEN
Casts: NONE SEEN /lpf
RBC, Urine: NONE SEEN /hpf (ref 0–2)

## 2019-06-08 NOTE — Telephone Encounter (Signed)
Authorization was approved for Diclofenac Sodium 1% gel from 06/08/2019 through 06/07/2020 [facility]

## 2019-06-13 DIAGNOSIS — Z0001 Encounter for general adult medical examination with abnormal findings: Secondary | ICD-10-CM | POA: Insufficient documentation

## 2019-06-13 DIAGNOSIS — E559 Vitamin D deficiency, unspecified: Secondary | ICD-10-CM | POA: Insufficient documentation

## 2019-06-13 DIAGNOSIS — M25511 Pain in right shoulder: Secondary | ICD-10-CM | POA: Insufficient documentation

## 2019-06-18 DIAGNOSIS — S46011A Strain of muscle(s) and tendon(s) of the rotator cuff of right shoulder, initial encounter: Secondary | ICD-10-CM | POA: Diagnosis not present

## 2019-07-06 ENCOUNTER — Other Ambulatory Visit: Payer: Self-pay

## 2019-07-06 ENCOUNTER — Encounter: Payer: Self-pay | Admitting: Nurse Practitioner

## 2019-07-06 ENCOUNTER — Ambulatory Visit (INDEPENDENT_AMBULATORY_CARE_PROVIDER_SITE_OTHER): Payer: Medicare Other | Admitting: Nurse Practitioner

## 2019-07-06 VITALS — BP 152/98 | HR 91 | Temp 98.2°F | Resp 16 | Ht 59.5 in | Wt 212.0 lb

## 2019-07-06 DIAGNOSIS — I10 Essential (primary) hypertension: Secondary | ICD-10-CM | POA: Diagnosis not present

## 2019-07-06 DIAGNOSIS — J3089 Other allergic rhinitis: Secondary | ICD-10-CM | POA: Diagnosis not present

## 2019-07-06 DIAGNOSIS — Z23 Encounter for immunization: Secondary | ICD-10-CM

## 2019-07-06 MED ORDER — CETIRIZINE HCL 10 MG PO TABS: 10.0000 mg | ORAL_TABLET | Freq: Every day | ORAL | 3 refills | Status: DC

## 2019-07-06 MED ORDER — PNEUMOCOCCAL 13-VAL CONJ VACC IM SUSP: 0.5000 mL | Freq: Once | INTRAMUSCULAR | 0 refills | Status: AC

## 2019-07-06 NOTE — Progress Notes (Signed)
[facility] [place], [place] [address]  Internal MEDICINE  Office Visit Note  Patient Name: Jamie Powers  [id number]  [id number]  Date of Service: 07/14/2019  Chief Complaint  Patient presents with  . Follow-up    blood pressure  . Hypertension  . Quality Metric Gaps    Pneumovax    The patient is here to follow up blood pressure.  Increased her losartan to 75mg  daily. In the office, her blood pressure still elevated. She is monitoring her pressure at home every day. She has brought blood pressure log with her. In general, her systolic pressure is running 120-130. Her diastolic pressure is between 80 and 90. She has not had negative side effects from this increase in medication.  She is due to have a pneumonia vaccine.       Current Medication: Outpatient Encounter Medications as of 07/06/2019  Medication Sig  . cetirizine (ZYRTEC) 10 MG tablet Take 1 tablet (10 mg total) by mouth daily.  . diclofenac Sodium (VOLTAREN) 1 % GEL Apply 4 g topically 4 (four) times daily.  . ergocalciferol (DRISDOL) 1.25 MG (50000 UT) capsule Take 1 capsule (50,000 Units total) by mouth once a week.  . ferrous sulfate 325 (65 FE) MG tablet Take 325 mg by mouth daily.  . Fluoxetine HCl, PMDD, 10 MG TABS Take 1 tablet (10 mg total) by mouth daily.  . hydrocortisone 2.5 % cream Apply 1 application topically as needed.  Marland Kitchen losartan (COZAAR) 50 MG tablet Take 1.5 tablets (75 mg total) by mouth daily.  Marland Kitchen nystatin cream (MYCOSTATIN) Apply topically 3 (three) times daily.  . pantoprazole (PROTONIX) 40 MG tablet Take 1 tablet (40 mg total) by mouth daily.  . [DISCONTINUED] cetirizine (ZYRTEC) 10 MG tablet Take 1 tablet (10 mg total) by mouth daily.  . [EXPIRED] pneumococcal 13-valent conjugate vaccine (PREVNAR 13) SUSP injection Inject 0.5 mLs into the muscle once for 1 dose.   No facility-administered encounter medications on file as of 07/06/2019.    Surgical History: Past  Surgical History:  Procedure Laterality Date  . BREAST BIOPSY Left 2000   neg  . BREAST SURGERY    . CHOLECYSTECTOMY    . COLONOSCOPY WITH PROPOFOL N/A 01/31/2015   Procedure: COLONOSCOPY WITH PROPOFOL;  Surgeon: Lollie Sails, MD;  Location: [facility] ENDOSCOPY;  Service: Endoscopy;  Laterality: N/A;  . TONSILLECTOMY      Medical History: Past Medical History:  Diagnosis Date  . Hypertension   . Obesity   . Protein in urine   . Sleep apnea     Family History: Family History  Problem Relation Age of Onset  . Breast cancer Paternal Aunt   . Hypertension Mother   . Heart disease Father     Social History   Socioeconomic History  . Marital status: Married    Spouse name: Not on file  . Number of children: Not on file  . Years of education: Not on file  . Highest education level: Not on file  Occupational History  . Not on file  Tobacco Use  . Smoking status: Never Smoker  . Smokeless tobacco: Never Used  Substance and Sexual Activity  . Alcohol use: No  . Drug use: No  . Sexual activity: Not on file  Other Topics Concern  . Not on file  Social History Narrative  . Not on file   Social Determinants of Health   Financial Resource Strain:   . Difficulty of Paying Living Expenses:  Food Insecurity:   . Worried About Charity fundraiser in the Last Year:   . Arboriculturist in the Last Year:   Transportation Needs:   . Film/video editor (Medical):   Marland Kitchen Lack of Transportation (Non-Medical):   Physical Activity:   . Days of Exercise per Week:   . Minutes of Exercise per Session:   Stress:   . Feeling of Stress :   Social Connections:   . Frequency of Communication with Friends and Family:   . Frequency of Social Gatherings with Friends and Family:   . Attends Religious Services:   . Active Member of Clubs or Organizations:   . Attends Archivist Meetings:   Marland Kitchen Marital Status:   Intimate Partner Violence:   . Fear of Current or Ex-Partner:    . Emotionally Abused:   Marland Kitchen Physically Abused:   . Sexually Abused:       Review of Systems  Constitutional: Negative for activity change, chills, fatigue and unexpected weight change.  HENT: Negative for congestion, postnasal drip, rhinorrhea, sneezing and sore throat.   Respiratory: Negative for cough, chest tightness, shortness of breath and wheezing.   Cardiovascular: Negative for chest pain and palpitations.       Improved blood pressure today.  Gastrointestinal: Negative for abdominal pain, constipation, diarrhea, nausea and vomiting.  Endocrine: Negative for cold intolerance, heat intolerance, polydipsia and polyuria.  Skin: Negative for rash.  Allergic/Immunologic: Negative for environmental allergies.  Neurological: Negative for dizziness, tremors, numbness and headaches.  Hematological: Negative for adenopathy. Does not bruise/bleed easily.  Psychiatric/Behavioral: Negative for behavioral problems (Depression), dysphoric mood, sleep disturbance and suicidal ideas. The patient is nervous/anxious.     Today's Vitals   07/06/19 1024  BP: (!) 152/98  Pulse: 91  Resp: 16  Temp: 98.2 F (36.8 C)  SpO2: 97%  Weight: 212 lb (96.2 kg)  Height: 4' 11.5" (1.511 m)   Body mass index is 42.1 kg/m.  Physical Exam Vitals and nursing note reviewed.  Constitutional:      General: She is not in acute distress.    Appearance: Normal appearance. She is well-developed. She is not diaphoretic.  HENT:     Head: Normocephalic and atraumatic.     Nose: Nose normal.     Mouth/Throat:     Pharynx: No oropharyngeal exudate.  Eyes:     Conjunctiva/sclera: Conjunctivae normal.     Pupils: Pupils are equal, round, and reactive to light.  Neck:     Thyroid: No thyromegaly.     Vascular: No carotid bruit or JVD.     Trachea: No tracheal deviation.  Cardiovascular:     Rate and Rhythm: Normal rate and regular rhythm.     Heart sounds: Normal heart sounds. No murmur heard.  No friction  rub. No gallop.   Pulmonary:     Effort: Pulmonary effort is normal. No respiratory distress.     Breath sounds: Normal breath sounds. No wheezing or rales.  Chest:     Chest wall: No tenderness.  Abdominal:     Palpations: Abdomen is soft.     Tenderness: There is no abdominal tenderness.  Musculoskeletal:        General: Normal range of motion.     Cervical back: Normal range of motion and neck supple.  Lymphadenopathy:     Cervical: No cervical adenopathy.  Skin:    General: Skin is warm and dry.  Neurological:     Mental Status:  She is alert and oriented to person, place, and time.     Cranial Nerves: No cranial nerve deficit.  Psychiatric:        Behavior: Behavior normal.        Thought Content: Thought content normal.        Judgment: Judgment normal.    Assessment/Plan: 1. Essential hypertension Patient has brought blood pressure log with her indicating that blood pressure has improved. Continue with increased dose of losartan.   2. Perennial allergic rhinitis Continue cetirizine 10mg  daily. New prescription provided.  - cetirizine (ZYRTEC) 10 MG tablet; Take 1 tablet (10 mg total) by mouth daily.  Dispense: 90 tablet; Refill: 3  3. Need for vaccination against Streptococcus pneumoniae using pneumococcal conjugate vaccine 13 Prescription for Prevnar 13 sent to her pharmacy for administration.  - pneumococcal 13-valent conjugate vaccine (PREVNAR 13) SUSP injection; Inject 0.5 mLs into the muscle once for 1 dose.  Dispense: 0.5 mL; Refill: 0  General Counseling: Raejean verbalizes understanding of the findings of todays visit and agrees with plan of treatment. I have discussed any further diagnostic evaluation that may be needed or ordered today. We also reviewed her medications today. she has been encouraged to call the office with any questions or concerns that should arise related to todays visit.   Hypertension Counseling:   The following hypertensive lifestyle  modification were recommended and discussed:  1. Limiting alcohol intake to less than 1 oz/day of ethanol:(24 oz of beer or 8 oz of wine or 2 oz of 100-proof whiskey). 2. Take baby ASA 81 mg daily. 3. Importance of regular aerobic exercise and losing weight. 4. Reduce dietary saturated fat and cholesterol intake for overall cardiovascular health. 5. Maintaining adequate dietary potassium, calcium, and magnesium intake. 6. Regular monitoring of the blood pressure. 7. Reduce sodium intake to less than 100 mmol/day (less than 2.3 gm of sodium or less than 6 gm of sodium choride)   This patient was seen by [place] with Dr Lavera Guise as a part of collaborative care agreement  Meds ordered this encounter  Medications  . cetirizine (ZYRTEC) 10 MG tablet    Sig: Take 1 tablet (10 mg total) by mouth daily.    Dispense:  90 tablet    Refill:  3    Please fill with insurance preferred alternative.    Order Specific Question:   Supervising Provider    Answer:   Lavera Guise [[id number]]  . pneumococcal 13-valent conjugate vaccine (PREVNAR 13) SUSP injection    Sig: Inject 0.5 mLs into the muscle once for 1 dose.    Dispense:  0.5 mL    Refill:  0    Order Specific Question:   Supervising Provider    Answer:   Lavera Guise [[id number]]    Total time spent: 20 Minutes   Time spent includes review of chart, medications, test results, and follow up plan with the patient.      Dr Lavera Guise Internal medicine

## 2019-07-14 DIAGNOSIS — Z23 Encounter for immunization: Secondary | ICD-10-CM | POA: Insufficient documentation

## 2019-07-14 DIAGNOSIS — J3089 Other allergic rhinitis: Secondary | ICD-10-CM | POA: Insufficient documentation

## 2019-07-16 DIAGNOSIS — S46011A Strain of muscle(s) and tendon(s) of the rotator cuff of right shoulder, initial encounter: Secondary | ICD-10-CM | POA: Diagnosis not present

## 2019-07-23 DIAGNOSIS — S46011A Strain of muscle(s) and tendon(s) of the rotator cuff of right shoulder, initial encounter: Secondary | ICD-10-CM | POA: Diagnosis not present

## 2019-09-07 DIAGNOSIS — M25511 Pain in right shoulder: Secondary | ICD-10-CM | POA: Diagnosis not present

## 2019-09-07 DIAGNOSIS — M25611 Stiffness of right shoulder, not elsewhere classified: Secondary | ICD-10-CM | POA: Diagnosis not present

## 2019-09-11 ENCOUNTER — Other Ambulatory Visit: Payer: Self-pay

## 2019-10-01 DIAGNOSIS — Z20822 Contact with and (suspected) exposure to covid-19: Secondary | ICD-10-CM | POA: Diagnosis not present

## 2019-10-11 DIAGNOSIS — M25611 Stiffness of right shoulder, not elsewhere classified: Secondary | ICD-10-CM | POA: Diagnosis not present

## 2019-10-11 DIAGNOSIS — M25511 Pain in right shoulder: Secondary | ICD-10-CM | POA: Diagnosis not present

## 2019-10-18 DIAGNOSIS — M25611 Stiffness of right shoulder, not elsewhere classified: Secondary | ICD-10-CM | POA: Diagnosis not present

## 2019-10-18 DIAGNOSIS — M25511 Pain in right shoulder: Secondary | ICD-10-CM | POA: Diagnosis not present

## 2019-10-25 DIAGNOSIS — M25611 Stiffness of right shoulder, not elsewhere classified: Secondary | ICD-10-CM | POA: Diagnosis not present

## 2019-10-25 DIAGNOSIS — M25511 Pain in right shoulder: Secondary | ICD-10-CM | POA: Diagnosis not present

## 2019-11-01 DIAGNOSIS — M25511 Pain in right shoulder: Secondary | ICD-10-CM | POA: Diagnosis not present

## 2019-11-01 DIAGNOSIS — M25611 Stiffness of right shoulder, not elsewhere classified: Secondary | ICD-10-CM | POA: Diagnosis not present

## 2019-11-05 ENCOUNTER — Encounter: Payer: Self-pay | Admitting: Nurse Practitioner

## 2019-11-05 ENCOUNTER — Ambulatory Visit (INDEPENDENT_AMBULATORY_CARE_PROVIDER_SITE_OTHER): Payer: Medicare Other | Admitting: Nurse Practitioner

## 2019-11-05 ENCOUNTER — Other Ambulatory Visit: Payer: Self-pay

## 2019-11-05 VITALS — BP 124/90 | HR 98 | Temp 97.5°F | Resp 16 | Ht 59.5 in | Wt 209.0 lb

## 2019-11-05 DIAGNOSIS — Z13820 Encounter for screening for osteoporosis: Secondary | ICD-10-CM

## 2019-11-05 DIAGNOSIS — L84 Corns and callosities: Secondary | ICD-10-CM | POA: Diagnosis not present

## 2019-11-05 DIAGNOSIS — I10 Essential (primary) hypertension: Secondary | ICD-10-CM | POA: Diagnosis not present

## 2019-11-05 DIAGNOSIS — Z1231 Encounter for screening mammogram for malignant neoplasm of breast: Secondary | ICD-10-CM

## 2019-11-05 DIAGNOSIS — J069 Acute upper respiratory infection, unspecified: Secondary | ICD-10-CM

## 2019-11-05 MED ORDER — AZITHROMYCIN 250 MG PO TABS: ORAL_TABLET | ORAL | 0 refills | Status: DC

## 2019-11-05 NOTE — Progress Notes (Signed)
[facility] [place], [place] [address]  Internal MEDICINE  Office Visit Note  Patient Name: Jamie Powers  [id number]  [id number]  Date of Service: 11/25/2019  Chief Complaint  Patient presents with  . Follow-up  . Hypertension  . Quality Metric Gaps    tetnaus  . Cough    The patient is here for routine follow up. Today, she states that she started having a cough on Saturday. She started taking coricidin and drinking hot tea which have helped. She denies headache, sore throat, fever, or body aches. She has been fully vaccinated against COVID 19, including booster shot. She states that she has noted herself wheezing. Symptoms are worse at night.  Callus on left foot which she has seen podiatrist before. Starting to bother her again.  She is due to have screening mammogram and bone density test.       Current Medication: Outpatient Encounter Medications as of 11/05/2019  Medication Sig  . cetirizine (ZYRTEC) 10 MG tablet Take 1 tablet (10 mg total) by mouth daily.  . diclofenac Sodium (VOLTAREN) 1 % GEL Apply 4 g topically 4 (four) times daily.  . hydrocortisone 2.5 % cream Apply 1 application topically as needed.  Marland Kitchen losartan (COZAAR) 50 MG tablet Take 1.5 tablets (75 mg total) by mouth daily.  Marland Kitchen nystatin cream (MYCOSTATIN) Apply topically 3 (three) times daily.  . pantoprazole (PROTONIX) 40 MG tablet Take 1 tablet (40 mg total) by mouth daily.  . [DISCONTINUED] ergocalciferol (DRISDOL) 1.25 MG (50000 UT) capsule Take 1 capsule (50,000 Units total) by mouth once a week.  . [DISCONTINUED] ferrous sulfate 325 (65 FE) MG tablet Take 325 mg by mouth daily.  . [DISCONTINUED] Fluoxetine HCl, PMDD, 10 MG TABS Take 1 tablet (10 mg total) by mouth daily.  . [DISCONTINUED] azithromycin (ZITHROMAX) 250 MG tablet z-pack - take as directed for 5 days   No facility-administered encounter medications on file as of 11/05/2019.    Surgical History: Past Surgical  History:  Procedure Laterality Date  . BREAST BIOPSY Left 2000   neg  . BREAST SURGERY    . CHOLECYSTECTOMY    . COLONOSCOPY WITH PROPOFOL N/A 01/31/2015   Procedure: COLONOSCOPY WITH PROPOFOL;  Surgeon: Lollie Sails, MD;  Location: [facility] ENDOSCOPY;  Service: Endoscopy;  Laterality: N/A;  . TONSILLECTOMY      Medical History: Past Medical History:  Diagnosis Date  . Hypertension   . Obesity   . Protein in urine   . Sleep apnea     Family History: Family History  Problem Relation Age of Onset  . Breast cancer Paternal Aunt   . Hypertension Mother   . Heart disease Father     Social History   Socioeconomic History  . Marital status: Married    Spouse name: Not on file  . Number of children: Not on file  . Years of education: Not on file  . Highest education level: Not on file  Occupational History  . Not on file  Tobacco Use  . Smoking status: Never Smoker  . Smokeless tobacco: Never Used  Substance and Sexual Activity  . Alcohol use: No  . Drug use: No  . Sexual activity: Not on file  Other Topics Concern  . Not on file  Social History Narrative  . Not on file   Social Determinants of Health   Financial Resource Strain:   . Difficulty of Paying Living Expenses: Not on file  Food Insecurity:   .  Worried About Charity fundraiser in the Last Year: Not on file  . Ran Out of Food in the Last Year: Not on file  Transportation Needs:   . Lack of Transportation (Medical): Not on file  . Lack of Transportation (Non-Medical): Not on file  Physical Activity:   . Days of Exercise per Week: Not on file  . Minutes of Exercise per Session: Not on file  Stress:   . Feeling of Stress : Not on file  Social Connections:   . Frequency of Communication with Friends and Family: Not on file  . Frequency of Social Gatherings with Friends and Family: Not on file  . Attends Religious Services: Not on file  . Active Member of Clubs or Organizations: Not on file  .  Attends Archivist Meetings: Not on file  . Marital Status: Not on file  Intimate Partner Violence:   . Fear of Current or Ex-Partner: Not on file  . Emotionally Abused: Not on file  . Physically Abused: Not on file  . Sexually Abused: Not on file      Review of Systems  Constitutional: Positive for fatigue. Negative for activity change, chills, fever and unexpected weight change.  HENT: Positive for congestion, postnasal drip, rhinorrhea, sinus pressure and sinus pain. Negative for sneezing and sore throat.   Respiratory: Positive for cough and wheezing. Negative for chest tightness and shortness of breath.   Cardiovascular: Negative for chest pain and palpitations.  Gastrointestinal: Negative for abdominal pain, constipation, diarrhea, nausea and vomiting.  Endocrine: Negative for cold intolerance, heat intolerance, polydipsia and polyuria.  Musculoskeletal: Negative for arthralgias and myalgias.  Skin: Negative for rash.       Patient reports callus formation on left foot which is tender.   Allergic/Immunologic: Negative for environmental allergies.  Neurological: Negative for dizziness, tremors, numbness and headaches.  Hematological: Negative for adenopathy. Does not bruise/bleed easily.  Psychiatric/Behavioral: Negative for behavioral problems (Depression), dysphoric mood, sleep disturbance and suicidal ideas. The patient is nervous/anxious.     Today's Vitals   11/05/19 1137  BP: 124/90  Pulse: 98  Resp: 16  Temp: (!) 97.5 F (36.4 C)  SpO2: 96%  Weight: 209 lb (94.8 kg)  Height: 4' 11.5" (1.511 m)   Body mass index is 41.51 kg/m.  Physical Exam Vitals and nursing note reviewed.  Constitutional:      General: She is not in acute distress.    Appearance: Normal appearance. She is well-developed. She is not diaphoretic.  HENT:     Head: Normocephalic and atraumatic.     Right Ear: Tympanic membrane is erythematous and bulging.     Left Ear: Tympanic  membrane is erythematous and bulging.     Nose: Congestion present.     Right Sinus: Maxillary sinus tenderness and frontal sinus tenderness present.     Left Sinus: Maxillary sinus tenderness and frontal sinus tenderness present.     Mouth/Throat:     Pharynx: No oropharyngeal exudate.  Eyes:     Conjunctiva/sclera: Conjunctivae normal.     Pupils: Pupils are equal, round, and reactive to light.  Neck:     Thyroid: No thyromegaly.     Vascular: No carotid bruit or JVD.     Trachea: No tracheal deviation.  Cardiovascular:     Rate and Rhythm: Normal rate and regular rhythm.     Heart sounds: Normal heart sounds. No murmur heard.  No friction rub. No gallop.   Pulmonary:  Effort: Pulmonary effort is normal. No respiratory distress.     Breath sounds: Normal breath sounds. No wheezing or rales.     Comments: Congested, non-productive cough. Chest:     Chest wall: No tenderness.  Abdominal:     Palpations: Abdomen is soft.     Tenderness: There is no abdominal tenderness.  Musculoskeletal:        General: Normal range of motion.     Cervical back: Normal range of motion and neck supple.  Lymphadenopathy:     Cervical: No cervical adenopathy.  Skin:    General: Skin is warm and dry.     Comments: Callus formation on bottom of left foot which is tender and hurts when she walks.   Neurological:     General: No focal deficit present.     Mental Status: She is alert and oriented to person, place, and time.     Cranial Nerves: No cranial nerve deficit.  Psychiatric:        Mood and Affect: Mood normal.        Behavior: Behavior normal.        Thought Content: Thought content normal.        Judgment: Judgment normal.    Assessment/Plan: 1. Acute upper respiratory infection Start z-pack. Take as directed for 5 days. Rest and increase fluids. May take OTC medication as needed and as indicated.   2. Callus of foot Refer to podiatry for further evaluation and treatment.  -  Ambulatory referral to Podiatry  3. Essential hypertension Stable. Continue bp medication as prescribed   4. Screening for osteoporosis - DG Bone Density; Future  5. Encounter for screening mammogram for malignant neoplasm of breast - MM DIGITAL SCREENING BILATERAL; Future  General Counseling: huldah marin understanding of the findings of todays visit and agrees with plan of treatment. I have discussed any further diagnostic evaluation that may be needed or ordered today. We also reviewed her medications today. she has been encouraged to call the office with any questions or concerns that should arise related to todays visit.  This patient was seen by Leretha Pol FNP Collaboration with Dr Lavera Guise as a part of collaborative care agreement  Orders Placed This Encounter  Procedures  . MM DIGITAL SCREENING BILATERAL  . DG Bone Density  . Ambulatory referral to [place] ordered this encounter  Medications  . DISCONTD: azithromycin (ZITHROMAX) 250 MG tablet    Sig: z-pack - take as directed for 5 days    Dispense:  6 tablet    Refill:  0    Order Specific Question:   Supervising Provider    Answer:   Lavera Guise [[id number]]    Total time spent: 30 Minutes   Time spent includes review of chart, medications, test results, and follow up plan with the patient.      Dr Lavera Guise Internal medicine

## 2019-11-15 DIAGNOSIS — M25611 Stiffness of right shoulder, not elsewhere classified: Secondary | ICD-10-CM | POA: Diagnosis not present

## 2019-11-15 DIAGNOSIS — M25511 Pain in right shoulder: Secondary | ICD-10-CM | POA: Diagnosis not present

## 2019-11-16 ENCOUNTER — Other Ambulatory Visit: Payer: Self-pay

## 2019-11-16 ENCOUNTER — Encounter: Payer: Self-pay | Admitting: Podiatry

## 2019-11-16 ENCOUNTER — Ambulatory Visit (INDEPENDENT_AMBULATORY_CARE_PROVIDER_SITE_OTHER): Payer: Medicare Other | Admitting: Podiatry

## 2019-11-16 DIAGNOSIS — L989 Disorder of the skin and subcutaneous tissue, unspecified: Secondary | ICD-10-CM | POA: Diagnosis not present

## 2019-11-16 NOTE — Progress Notes (Signed)
   Subjective: 64 year old female presents to the office today forevaluation of a callus lesion located at the sub-5th MPJ of the left foot. She denies any pain or modifying factors at this time but states the callus needs trimming. She denies any new complaints. Patient presents today for further treatment and evaluation.   Past Medical History:  Diagnosis Date  . Hypertension   . Obesity   . Protein in urine   . Sleep apnea     Objective:  Physical Exam General: Alert and oriented x3 in no acute distress  Dermatology: Hyperkeratotic lesion present on the sub-5th MPJ of the left foot. Pain on palpation with a central nucleated core noted.  Skin is warm, dry and supple bilateral lower extremities. Negative for open lesions or macerations.  Vascular: Palpable pedal pulses bilaterally. No edema or erythema noted. Capillary refill within normal limits.  Neurological: Epicritic and protective threshold grossly intact bilaterally.   Musculoskeletal Exam: Pain on palpation at the keratotic lesion noted. Range of motion within normal limits bilateral. Muscle strength 5/5 in all groups bilateral.  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 #2 Excisional debridement of keratoic lesion using a chisel blade was performed without incident.  #3 Treated area(s) with Salinocaine and dressed with light dressing. #4  Recommend OTC corn callus remover #5 Return to clinic as needed.    Edrick Kins, DPM [facility]  Dr. Edrick Kins, [address]                                        [place], [place] [address]                Office [phone]  Fax [phone]

## 2019-11-22 DIAGNOSIS — M25611 Stiffness of right shoulder, not elsewhere classified: Secondary | ICD-10-CM | POA: Diagnosis not present

## 2019-11-22 DIAGNOSIS — M25511 Pain in right shoulder: Secondary | ICD-10-CM | POA: Diagnosis not present

## 2019-11-25 DIAGNOSIS — Z13820 Encounter for screening for osteoporosis: Secondary | ICD-10-CM | POA: Insufficient documentation

## 2019-11-25 DIAGNOSIS — J069 Acute upper respiratory infection, unspecified: Secondary | ICD-10-CM | POA: Insufficient documentation

## 2019-11-25 DIAGNOSIS — L84 Corns and callosities: Secondary | ICD-10-CM | POA: Insufficient documentation

## 2019-11-27 DIAGNOSIS — M25611 Stiffness of right shoulder, not elsewhere classified: Secondary | ICD-10-CM | POA: Diagnosis not present

## 2019-11-27 DIAGNOSIS — M25511 Pain in right shoulder: Secondary | ICD-10-CM | POA: Diagnosis not present

## 2019-12-04 ENCOUNTER — Other Ambulatory Visit: Payer: Self-pay

## 2019-12-04 DIAGNOSIS — I10 Essential (primary) hypertension: Secondary | ICD-10-CM

## 2019-12-04 MED ORDER — LOSARTAN POTASSIUM 50 MG PO TABS: 75.0000 mg | ORAL_TABLET | Freq: Every day | ORAL | 1 refills | Status: DC

## 2019-12-24 ENCOUNTER — Other Ambulatory Visit: Payer: Self-pay

## 2019-12-24 DIAGNOSIS — Z13820 Encounter for screening for osteoporosis: Secondary | ICD-10-CM

## 2020-01-10 ENCOUNTER — Telehealth: Payer: Self-pay

## 2020-01-10 NOTE — Telephone Encounter (Signed)
LMOM for [facility] imaging [phone]) to see if pt had mammogram and dexa scheduled

## 2020-01-14 ENCOUNTER — Other Ambulatory Visit: Payer: Self-pay | Admitting: Hospice and Palliative Medicine

## 2020-01-31 DIAGNOSIS — Z1231 Encounter for screening mammogram for malignant neoplasm of breast: Secondary | ICD-10-CM | POA: Diagnosis not present

## 2020-01-31 DIAGNOSIS — M8588 Other specified disorders of bone density and structure, other site: Secondary | ICD-10-CM | POA: Diagnosis not present

## 2020-01-31 DIAGNOSIS — Z780 Asymptomatic menopausal state: Secondary | ICD-10-CM | POA: Diagnosis not present

## 2020-01-31 DIAGNOSIS — E2839 Other primary ovarian failure: Secondary | ICD-10-CM | POA: Diagnosis not present

## 2020-02-22 ENCOUNTER — Other Ambulatory Visit: Payer: Self-pay

## 2020-02-22 DIAGNOSIS — K219 Gastro-esophageal reflux disease without esophagitis: Secondary | ICD-10-CM

## 2020-02-22 MED ORDER — PANTOPRAZOLE SODIUM 40 MG PO TBEC: 40.0000 mg | DELAYED_RELEASE_TABLET | Freq: Every day | ORAL | 3 refills | Status: DC

## 2020-03-04 ENCOUNTER — Ambulatory Visit: Payer: Medicare Other | Admitting: Hospice and Palliative Medicine

## 2020-03-04 ENCOUNTER — Encounter: Payer: Self-pay | Admitting: Hospice and Palliative Medicine

## 2020-03-04 ENCOUNTER — Other Ambulatory Visit: Payer: Self-pay

## 2020-03-04 VITALS — BP 152/88 | HR 90 | Temp 97.4°F | Resp 16 | Ht 60.0 in | Wt 215.6 lb

## 2020-03-04 DIAGNOSIS — I10 Essential (primary) hypertension: Secondary | ICD-10-CM

## 2020-03-04 DIAGNOSIS — R5383 Other fatigue: Secondary | ICD-10-CM | POA: Diagnosis not present

## 2020-03-04 DIAGNOSIS — M8588 Other specified disorders of bone density and structure, other site: Secondary | ICD-10-CM | POA: Diagnosis not present

## 2020-03-04 MED ORDER — AMLODIPINE BESYLATE 2.5 MG PO TABS: 2.5000 mg | ORAL_TABLET | Freq: Every day | ORAL | 1 refills | Status: DC

## 2020-03-04 NOTE — Progress Notes (Signed)
[facility] [place], [place] [address]  Internal MEDICINE  Office Visit Note  Patient Name: Jamie Powers  [id number]  [id number]  Date of Service: 03/05/2020  Chief Complaint  Patient presents with  . Follow-up  . Hypertension  . Sleep Apnea    HPI Patient is here for routine follow-up Things have been going well BP elevated today--has been taking only 50 mg of losartan as it is hard for her to cut pills in half Had recent debridement of foot callus--healing well  Recently had mammogram and bone density Normal mammogram--repeat in 1 year BMD-osteopenia to lumbar spine, not currently taking OTC vitamin D or calcium  Followed by nephrology for proteinuria and CKD--stable  Due for routine fasting labs  Current Medication: Outpatient Encounter Medications as of 03/04/2020  Medication Sig  . amLODipine (NORVASC) 2.5 MG tablet Take 1 tablet (2.5 mg total) by mouth daily.  . cetirizine (ZYRTEC) 10 MG tablet Take 1 tablet (10 mg total) by mouth daily.  . diclofenac Sodium (VOLTAREN) 1 % GEL Apply 4 g topically 4 (four) times daily.  Marland Kitchen FLUoxetine (PROZAC) 10 MG capsule fluoxetine 10 mg capsule  . hydrocortisone 2.5 % cream Apply 1 application topically as needed.  Marland Kitchen losartan (COZAAR) 50 MG tablet Take 1.5 tablets (75 mg total) by mouth daily.  Marland Kitchen nystatin cream (MYCOSTATIN) Apply topically 3 (three) times daily.  . pantoprazole (PROTONIX) 40 MG tablet Take 1 tablet (40 mg total) by mouth daily.   No facility-administered encounter medications on file as of 03/04/2020.    Surgical History: Past Surgical History:  Procedure Laterality Date  . BREAST BIOPSY Left 2000   neg  . BREAST SURGERY    . CHOLECYSTECTOMY    . COLONOSCOPY WITH PROPOFOL N/A 01/31/2015   Procedure: COLONOSCOPY WITH PROPOFOL;  Surgeon: Lollie Sails, MD;  Location: [facility] ENDOSCOPY;  Service: Endoscopy;  Laterality: N/A;  . TONSILLECTOMY      Medical History: Past Medical  History:  Diagnosis Date  . Hypertension   . Obesity   . Protein in urine   . Sleep apnea     Family History: Family History  Problem Relation Age of Onset  . Breast cancer Paternal Aunt   . Hypertension Mother   . Heart disease Father     Social History   Socioeconomic History  . Marital status: Married    Spouse name: Not on file  . Number of children: Not on file  . Years of education: Not on file  . Highest education level: Not on file  Occupational History  . Not on file  Tobacco Use  . Smoking status: Never Smoker  . Smokeless tobacco: Never Used  Substance and Sexual Activity  . Alcohol use: No  . Drug use: No  . Sexual activity: Not on file  Other Topics Concern  . Not on file  Social History Narrative  . Not on file   Social Determinants of Health   Financial Resource Strain: Not on file  Food Insecurity: Not on file  Transportation Needs: Not on file  Physical Activity: Not on file  Stress: Not on file  Social Connections: Not on file  Intimate Partner Violence: Not on file      Review of Systems  Constitutional: Negative for chills, diaphoresis and fatigue.  HENT: Negative for ear pain, postnasal drip and sinus pressure.   Eyes: Negative for photophobia, discharge, redness, itching and visual disturbance.  Respiratory: Negative for cough, shortness of breath  and wheezing.   Cardiovascular: Negative for chest pain, palpitations and leg swelling.  Gastrointestinal: Negative for abdominal pain, constipation, diarrhea, nausea and vomiting.  Genitourinary: Negative for dysuria and flank pain.  Musculoskeletal: Negative for arthralgias, back pain, gait problem and neck pain.  Skin: Negative for color change.  Allergic/Immunologic: Negative for environmental allergies and food allergies.  Neurological: Negative for dizziness and headaches.  Hematological: Does not bruise/bleed easily.  Psychiatric/Behavioral: Negative for agitation, behavioral  problems (depression) and hallucinations.    Vital Signs: BP (!) 152/88   Pulse 90   Temp (!) 97.4 F (36.3 C)   Resp 16   Ht 5' (1.524 m)   Wt 215 lb 9.6 oz (97.8 kg)   SpO2 97%   BMI 42.11 kg/m    Physical Exam Vitals reviewed.  Constitutional:      Appearance: Normal appearance. She is obese.  Cardiovascular:     Rate and Rhythm: Normal rate and regular rhythm.     Pulses: Normal pulses.     Heart sounds: Normal heart sounds.  Pulmonary:     Effort: Pulmonary effort is normal.     Breath sounds: Normal breath sounds.  Abdominal:     General: Abdomen is flat.     Palpations: Abdomen is soft.  Musculoskeletal:        General: Normal range of motion.     Cervical back: Normal range of motion.  Skin:    General: Skin is warm.  Neurological:     General: No focal deficit present.     Mental Status: She is alert and oriented to person, place, and time. Mental status is at baseline.  Psychiatric:        Mood and Affect: Mood normal.        Behavior: Behavior normal.        Thought Content: Thought content normal.        Judgment: Judgment normal.    Assessment/Plan: 1. Osteopenia of lumbar spine Declines bisphosphonate therapy, encouraged to start daily calcium and vitamin D supplements Repeat BMD in 2 years  2. Essential hypertension Decrease losartan to 50 mg daily Add low dose amlodipine Will closely monitor BP response - amLODipine (NORVASC) 2.5 MG tablet; Take 1 tablet (2.5 mg total) by mouth daily.  Dispense: 90 tablet; Refill: 1  3. Other fatigue - CBC w/Diff/Platelet - Comprehensive Metabolic Panel (CMET) - Lipid Panel With LDL/HDL Ratio - TSH + free T4 - Vitamin D 1,25 dihydroxy - B12  General Counseling: Jamie Powers verbalizes understanding of the findings of todays visit and agrees with plan of treatment. I have discussed any further diagnostic evaluation that may be needed or ordered today. We also reviewed her medications today. she has been  encouraged to call the office with any questions or concerns that should arise related to todays visit.    Orders Placed This Encounter  Procedures  . CBC w/Diff/Platelet  . Comprehensive Metabolic Panel (CMET)  . Lipid Panel With LDL/HDL Ratio  . TSH + free T4  . Vitamin D 1,25 dihydroxy  . B12    Meds ordered this encounter  Medications  . amLODipine (NORVASC) 2.5 MG tablet    Sig: Take 1 tablet (2.5 mg total) by mouth daily.    Dispense:  90 tablet    Refill:  1    Time spent: 30 Minutes Time spent includes review of chart, medications, test results and follow-up plan with the patient.  This patient was seen by Theodoro Grist AGNP-C in  Collaboration with Dr Lavera Guise as a part of collaborative care agreement     Tanna Furry. Kendrik Mcshan AGNP-C Internal medicine

## 2020-03-05 ENCOUNTER — Encounter: Payer: Self-pay | Admitting: Hospice and Palliative Medicine

## 2020-03-05 DIAGNOSIS — R5383 Other fatigue: Secondary | ICD-10-CM | POA: Diagnosis not present

## 2020-03-11 NOTE — Progress Notes (Signed)
Labs reviewed, will be discussed at next visit.

## 2020-03-14 LAB — CBC WITH DIFFERENTIAL/PLATELET
Basophils Absolute: 0.1 10*3/uL (ref 0.0–0.2)
Basos: 1 %
EOS (ABSOLUTE): 0.2 10*3/uL (ref 0.0–0.4)
Eos: 3 %
Hematocrit: 39 % (ref 34.0–46.6)
Hemoglobin: 12.8 g/dL (ref 11.1–15.9)
Immature Grans (Abs): 0 10*3/uL (ref 0.0–0.1)
Immature Granulocytes: 0 %
Lymphocytes Absolute: 2.1 10*3/uL (ref 0.7–3.1)
Lymphs: 29 %
MCH: 29.5 pg (ref 26.6–33.0)
MCHC: 32.8 g/dL (ref 31.5–35.7)
MCV: 90 fL (ref 79–97)
Monocytes Absolute: 0.5 10*3/uL (ref 0.1–0.9)
Monocytes: 7 %
Neutrophils Absolute: 4.6 10*3/uL (ref 1.4–7.0)
Neutrophils: 60 %
Platelets: 348 10*3/uL (ref 150–450)
RBC: 4.34 x10E6/uL (ref 3.77–5.28)
RDW: 12.5 % (ref 11.7–15.4)
WBC: 7.4 10*3/uL (ref 3.4–10.8)

## 2020-03-14 LAB — COMPREHENSIVE METABOLIC PANEL
ALT: 16 IU/L (ref 0–32)
AST: 17 IU/L (ref 0–40)
Albumin/Globulin Ratio: 1.2 (ref 1.2–2.2)
Albumin: 3.9 g/dL (ref 3.8–4.8)
Alkaline Phosphatase: 116 IU/L (ref 44–121)
BUN/Creatinine Ratio: 23 (ref 12–28)
BUN: 15 mg/dL (ref 8–27)
Bilirubin Total: 0.4 mg/dL (ref 0.0–1.2)
CO2: 25 mmol/L (ref 20–29)
Calcium: 9.4 mg/dL (ref 8.7–10.3)
Chloride: 101 mmol/L (ref 96–106)
Creatinine, Ser: 0.65 mg/dL (ref 0.57–1.00)
Globulin, Total: 3.3 g/dL (ref 1.5–4.5)
Glucose: 99 mg/dL (ref 65–99)
Potassium: 4.4 mmol/L (ref 3.5–5.2)
Sodium: 140 mmol/L (ref 134–144)
Total Protein: 7.2 g/dL (ref 6.0–8.5)
eGFR: 98 mL/min/{1.73_m2} (ref >59)

## 2020-03-14 LAB — LIPID PANEL WITH LDL/HDL RATIO
Cholesterol, Total: 191 mg/dL (ref 100–199)
HDL: 61 mg/dL (ref >39)
LDL Chol Calc (NIH): 119 mg/dL — ABNORMAL HIGH (ref 0–99)
LDL/HDL Ratio: 2 ratio (ref 0.0–3.2)
Triglycerides: 59 mg/dL (ref 0–149)
VLDL Cholesterol Cal: 11 mg/dL (ref 5–40)

## 2020-03-14 LAB — TSH+FREE T4
Free T4: 1.3 ng/dL (ref 0.82–1.77)
TSH: 0.93 u[IU]/mL (ref 0.450–4.500)

## 2020-03-14 LAB — VITAMIN D 1,25 DIHYDROXY
Vitamin D 1, 25 (OH)2 Total: 77 pg/mL — ABNORMAL HIGH
Vitamin D2 1, 25 (OH)2: 50 pg/mL
Vitamin D3 1, 25 (OH)2: 27 pg/mL

## 2020-03-14 LAB — VITAMIN B12: Vitamin B-12: 511 pg/mL (ref 232–1245)

## 2020-03-28 ENCOUNTER — Telehealth: Payer: Self-pay | Admitting: Internal Medicine

## 2020-03-28 NOTE — Progress Notes (Signed)
  Chronic Care Management   Note  03/28/2020 Name: Dyneisha Murchison MRN: [id number] DOB: Nov 27, 1955  Liliana Dang is a 65 y.o. year old female who is a primary care patient of Lavera Guise, MD. I reached out to Carmelina Peal by phone today in response to a referral sent by Ms. Neoma Laming Armon's PCP, Lavera Guise, MD.   Ms. Kazanjian was given information about Chronic Care Management services today including:  1. CCM service includes personalized support from designated clinical staff supervised by her physician, including individualized plan of care and coordination with other care providers 2. 24/7 contact phone numbers for assistance for urgent and routine care needs. 3. Service will only be billed when office clinical staff spend 20 minutes or more in a month to coordinate care. 4. Only one practitioner may furnish and bill the service in a calendar month. 5. The patient may stop CCM services at any time (effective at the end of the month) by phone call to the office staff.   Patient agreed to services and verbal consent obtained.   Follow up plan:   Carley Perdue [employer] Scheduler

## 2020-04-10 NOTE — Progress Notes (Signed)
Chronic Care Management Pharmacy Note  04/16/2020 Name:  Jamie Powers MRN:  [id number] DOB:  11-Sep-1955  Subjective: Jamie Powers is an 65 y.o. year old female who is a primary patient of Humphrey Rolls Timoteo Gaul, MD.  The CCM team was consulted for assistance with disease management and care coordination needs.    Engaged with patient by telephone for initial visit in response to provider referral for pharmacy case management and/or care coordination services.   Consent to Services:  The patient was given the following information about Chronic Care Management services today, agreed to services, and gave verbal consent: 1. CCM service includes personalized support from designated clinical staff supervised by the primary care provider, including individualized plan of care and coordination with other care providers 2. 24/7 contact phone numbers for assistance for urgent and routine care needs. 3. Service will only be billed when office clinical staff spend 20 minutes or more in a month to coordinate care. 4. Only one practitioner may furnish and bill the service in a calendar month. 5.The patient may stop CCM services at any time (effective at the end of the month) by phone call to the office staff. 6. The patient will be responsible for cost sharing (co-pay) of up to 20% of the service fee (after annual deductible is met). Patient agreed to services and consent obtained.  Patient Care Team: Lavera Guise, MD as PCP - General (Internal Medicine) Edythe Clarity, [facility] as Pharmacist (Pharmacist)  Recent office visits: 03/04/20 Humphrey Rolls) - had only been taking 12m of losartan (hard do cut pills in half).  Low dose amlodipine 2.548mdaily added.  Patient also declines bisphosphonate therapy.  11/05/2019 (BJunius Creamer- podiatry referral for foot callus, bone density ordered.  Z-pak for 5 days for acute URTI.  Recent consult visits: 11/16/2019 (EAmalia Haileypodiatry) - no concerns, no medication changes  Hospital  visits: None in previous 6 months  Objective:  Lab Results  Component Value Date   CREATININE 0.65 03/05/2020   BUN 15 03/05/2020   GFRNONAA 93 05/22/2018   GFRAA 107 05/22/2018   NA 140 03/05/2020   K 4.4 03/05/2020   CALCIUM 9.4 03/05/2020   CO2 25 03/05/2020   GLUCOSE 99 03/05/2020    No results found for: HGBA1C, FRUCTOSAMINE, GFR, MICROALBUR  Last diabetic Eye exam: No results found for: HMDIABEYEEXA  Last diabetic Foot exam: No results found for: HMDIABFOOTEX   Lab Results  Component Value Date   CHOL 191 03/05/2020   HDL 61 03/05/2020   LDLCALC 119 (H) 03/05/2020   TRIG 59 03/05/2020    Hepatic Function Latest Ref Rng & Units 03/05/2020 05/22/2018 05/05/2017  Total Protein 6.0 - 8.5 g/dL 7.2 6.9 6.9  Albumin 3.8 - 4.8 g/dL 3.9 4.2 3.9  AST 0 - 40 IU/L _0 ALT 0 - 32 IU/L _1 Alk Phosphatase 44 - 121 IU/L 116 108 94  Total Bilirubin 0.0 - 1.2 mg/dL 0.4 0.3 0.3    Lab Results  Component Value Date/Time   TSH 0.930 03/05/2020 01:38 PM   TSH 1.580 05/22/2018 11:11 AM   FREET4 1.30 03/05/2020 01:38 PM   FREET4 1.08 05/22/2018 11:11 AM    CBC Latest Ref Rng & Units 03/05/2020 10/09/2018 05/22/2018  WBC 3.4 - 10.8 x10E3/uL 7.4 6.9 6.7  Hemoglobin 11.1 - 15.9 g/dL 12.8 12.5 12.8  Hematocrit 34.0 - 46.6 % 39.0 38.5 38.8  Platelets 150 - 450 x10E3/uL 348 362 346  Lab Results  Component Value Date/Time   VD25OH 40.0 10/09/2018 02:40 PM   VD25OH 16.6 (L) 05/22/2018 11:11 AM    Clinical ASCVD: No  The 10-year ASCVD risk score Mikey Bussing DC Jr., et al., 2013) is: 13.3%   Values used to calculate the score:     Age: 53 years     Sex: Female     Is Non-Hispanic African American: Yes     Diabetic: No     Tobacco smoker: No     Systolic Blood Pressure: 462 mmHg     Is BP treated: Yes     HDL Cholesterol: 61 mg/dL     Total Cholesterol: 191 mg/dL    Depression screen [facility] 2/9 03/04/2020 11/05/2019 07/06/2019  Decreased Interest 0 0 0  Down, Depressed, Hopeless  0 0 0  PHQ - 2 Score 0 0 0      Social History   Tobacco Use  Smoking Status Never Smoker  Smokeless Tobacco Never Used   BP Readings from Last 3 Encounters:  03/04/20 (!) 152/88  11/05/19 124/90  07/06/19 (!) 152/98   Pulse Readings from Last 3 Encounters:  03/04/20 90  11/05/19 98  07/06/19 91   Wt Readings from Last 3 Encounters:  03/04/20 215 lb 9.6 oz (97.8 kg)  11/05/19 209 lb (94.8 kg)  07/06/19 212 lb (96.2 kg)   BMI Readings from Last 3 Encounters:  03/04/20 42.11 kg/m  11/05/19 41.51 kg/m  07/06/19 42.10 kg/m    Assessment/Interventions: Review of patient past medical history, allergies, medications, health status, including review of consultants reports, laboratory and other test data, was performed as part of comprehensive evaluation and provision of chronic care management services.   SDOH:  (Social Determinants of Health) assessments and interventions performed: Yes  SDOH Screenings   Alcohol Screen: Low Risk   . Last Alcohol Screening Score (AUDIT): 0  Depression (PHQ2-9): Low Risk   . PHQ-2 Score: 0  Financial Resource Strain: Low Risk   . Difficulty of Paying Living Expenses: Not hard at all  Food Insecurity: Not on file  Housing: Not on file  Physical Activity: Not on file  Social Connections: Not on file  Stress: Not on file  Tobacco Use: Low Risk   . Smoking Tobacco Use: Never Smoker  . Smokeless Tobacco Use: Never Used  Transportation Needs: Not on file    CCM Care Plan  Allergies  Allergen Reactions  . Penicillins     Medications Reviewed Today    Reviewed by Edythe Clarity, [facility] (Pharmacist) on 04/16/20 at 1154  Med List Status: <None>  Medication Order Taking? Sig Documenting Provider Last Dose Status Informant  amLODipine (NORVASC) 2.5 MG tablet [id number] Yes Take 1 tablet (2.5 mg total) by mouth daily. Luiz Ochoa, NP Taking Active   cetirizine (ZYRTEC) 10 MG tablet [id number] Yes Take 1 tablet (10 mg total) by  mouth daily. Ronnell Freshwater, NP Taking Active   diclofenac Sodium (VOLTAREN) 1 % GEL [id number] Yes Apply 4 g topically 4 (four) times daily. Ronnell Freshwater, NP Taking Active   FLUoxetine (PROZAC) 10 MG capsule [id number] Yes fluoxetine 10 mg capsule [provider] Taking Active   hydrocortisone 2.5 % cream [id number] Yes Apply 1 application topically as needed. [provider] Taking Active   losartan (COZAAR) 50 MG tablet [id number] Yes Take 1.5 tablets (75 mg total) by mouth daily. Ronnell Freshwater, NP Taking Active   nystatin cream (MYCOSTATIN) [id number] Yes Apply topically 3 (  three) times daily. Ronnell Freshwater, NP Taking Active   pantoprazole (PROTONIX) 40 MG tablet [id number] Yes Take 1 tablet (40 mg total) by mouth daily. Lavera Guise, MD Taking Active           Patient Active Problem List   Diagnosis Date Noted  . Acute upper respiratory infection 11/25/2019  . Callus of foot 11/25/2019  . Screening for osteoporosis 11/25/2019  . Perennial allergic rhinitis 07/14/2019  . Need for vaccination against Streptococcus pneumoniae using pneumococcal conjugate vaccine 13 07/14/2019  . Encounter for general adult medical examination with abnormal findings 06/13/2019  . Right shoulder pain 06/13/2019  . Vitamin D deficiency 06/13/2019  . Gastroesophageal reflux disease without esophagitis 10/15/2018  . Iron deficiency anemia 10/15/2018  . Vitamin B12 deficiency 10/15/2018  . Encounter for hepatitis C screening test for low risk patient 10/15/2018  . Encounter for screening mammogram for malignant neoplasm of breast 10/15/2018  . Lumbar disc disease with radiculopathy 05/22/2018  . Cutaneous candidiasis 05/22/2018  . Dysuria 05/22/2018  . Acute pharyngitis 04/02/2018  . Sore throat 04/02/2018  . Pain in joint of right knee 01/29/2018  . Chest pain 10/17/2017  . SOB (shortness of breath) 10/17/2017  . Vasomotor symptoms due to menopause 10/17/2017  . Routine  cervical smear 10/17/2017  . Mixed hyperlipidemia 01/05/2017  . Essential hypertension 12/17/2014  . Obesity 12/17/2014  . Obstructive sleep apnea 12/17/2014  . Proteinuria 12/17/2014    Immunization History  Administered Date(s) Administered  . Fluad Quad(high Dose 65+) 10/15/2019  . Influenza Inj Mdck Quad With Preservative 11/04/2017  . Influenza, Quadrivalent, Recombinant, Inj, Pf 10/18/2018  . Influenza,inj,Quad PF,6+ Mos 01/05/2017  . Influenza,inj,quad, With Preservative 10/05/2018  . Influenza-Unspecified 01/06/2016  . PFIZER(Purple Top)SARS-COV-2 Vaccination 02/13/2019, 03/06/2019, 10/15/2019  . Pneumococcal Conjugate-13 08/03/2019  . Pneumococcal Polysaccharide-23 07/06/2019  . Unspecified SARS-COV-2 Vaccination 03/06/2019    Conditions to be addressed/monitored:  HTN, GERD, HLD, Osteopenia  Care Plan : General Pharmacy (Adult)  Updates made by Edythe Clarity, RPH since 04/16/2020 12:00 AM    Problem: HTN, GERD, HLD, Osteopenia   Priority: High  Onset Date: 04/16/2020    Long-Range Goal: Patient-Specific Goal   Start Date: 04/16/2020  Expected End Date: 10/16/2020  This Visit's Progress: On track  Priority: High  Note:   Current Barriers:  . No specific barriers identified  Pharmacist Clinical Goal(s):  Marland Kitchen Patient will verbalize ability to afford treatment regimen . maintain control of blood pressure as evidenced by home monitoring  . contact provider office for questions/concerns as evidenced notation of same in electronic health record through collaboration with PharmD and provider.   Interventions: . 1:1 collaboration with Lavera Guise, MD regarding development and update of comprehensive plan of care as evidenced by provider attestation and co-signature . Inter-disciplinary care team collaboration (see longitudinal plan of care) . Comprehensive medication review performed; medication list updated in electronic medical record  Hypertension (BP goal  <140/90) -Controlled -Current treatment: . Amlodipine 2.76m daily . Losartan 574mdaily -Medications previously tried: none noted  -Current home readings: 120/80, 134/90, no other specific readings but reports all < 140/90 -Current exercise habits: she is a member of a gym but does not go often, walks around the store or sometimes around the house -Denies hypotensive/hypertensive symptoms -Educated on BP goals and benefits of medications for prevention of heart attack, stroke and kidney damage; Exercise goal of 150 minutes per week; Importance of home blood pressure monitoring; -Counseled to monitor BP at  home daily, document, and provide log at future appointments -Recommended to continue current medication Recommended implement walking plan or go to the gym as tolerated.  Hyperlipidemia: (LDL goal < 100) -Not ideally controlled -Current treatment: . None -Medications previously tried: none noted  -Current exercise habits: see above -Educated on Cholesterol goals;  Benefits of statin for ASCVD risk reduction; Importance of limiting foods high in cholesterol;  -Consider initiating statin therapy - moderate intensity per ASCVD risk %. -Counseled on diet and exercise extensively  Osteopenia (Goal Prevent fractures) -Not ideally controlled -Last DEXA Scan: 01/31/20  T-Score femoral neck: -0.6   T-Score lumbar spine: -1.4  -Patient is not a candidate for pharmacologic treatment -Current treatment  . None -Medications previously tried: none noted  -Recommend [phone] units of vitamin D daily. Recommend 1200 mg of calcium daily from dietary and supplemental sources. Recommend weight-bearing and muscle strengthening exercises for building and maintaining bone density.  -No FRAX score documented -Recommended patient begin taking Calcium + Vitamin D.  Based off FRAX she could be candidate for bisphosphonate therapy.  Per note from NP, patient has refused this therapy.  Patient wants  to discuss wiith NP at next follow up.  GERD (Goal: Minimize symptoms) -Controlled -Current treatment  . Pantoprazole 51m daily -Medications previously tried: none noted -Patient reports no symptoms lately.  -Recommended to continue current medication   Patient Goals/Self-Care Activities . Patient will:  - take medications as prescribed focus on medication adherence by fill dates check blood pressure daily, document, and provide at future appointments target a minimum of 150 minutes of moderate intensity exercise weekly  Follow Up Plan: The care management team will reach out to the patient again over the next 180 days.        Medication Assistance: None required.  Patient affirms current coverage meets needs.  [id number] [id number]: [phone]: [phone] Uses pill box? No - patient remembers without one Pt endorses 100% compliance  We discussed: Current pharmacy is preferred with insurance plan and patient is satisfied with pharmacy services Patient decided to: Continue current medication management strategy  Care Plan and Follow Up Patient Decision:  Patient agrees to Care Plan and Follow-up.  Plan: The care management team will reach out to the patient again over the next 180 days.  CBeverly Milch PharmD Clinical Pharmacist [phone]

## 2020-04-15 ENCOUNTER — Telehealth: Payer: Self-pay | Admitting: Pharmacist

## 2020-04-15 NOTE — Progress Notes (Addendum)
    Chronic Care Management Pharmacy Assistant   Name: Jamie Powers  MRN: [id number] DOB: 06/22/1955  Reason for Encounter: Initial Questions  Medications: Outpatient Encounter Medications as of 04/15/2020  Medication Sig   amLODipine (NORVASC) 2.5 MG tablet Take 1 tablet (2.5 mg total) by mouth daily.   cetirizine (ZYRTEC) 10 MG tablet Take 1 tablet (10 mg total) by mouth daily.   diclofenac Sodium (VOLTAREN) 1 % GEL Apply 4 g topically 4 (four) times daily.   FLUoxetine (PROZAC) 10 MG capsule fluoxetine 10 mg capsule   hydrocortisone 2.5 % cream Apply 1 application topically as needed.   losartan (COZAAR) 50 MG tablet Take 1.5 tablets (75 mg total) by mouth daily.   nystatin cream (MYCOSTATIN) Apply topically 3 (three) times daily.   pantoprazole (PROTONIX) 40 MG tablet Take 1 tablet (40 mg total) by mouth daily.   No facility-administered encounter medications on file as of 04/15/2020.   Have you seen any other providers since your last visit? Patient stated no.  Any changes in your medications or health? Patient stated no.  Any side effects from any medications? Patient stated no.  Do you have an symptoms or problems not managed by your medications? Patient stated no.  Any concerns about your health right now? Patient stated she seems to be more fatigue.  Has your provider asked that you check blood pressure, blood sugar, or follow special diet at home? Patient stated no but she does check her blood pressure regularly on her own. She stated it has been improving since her medication change.   Do you get any type of exercise on a regular basis? Patient started no.   Can you think of a goal you would like to reach for your health? Patient stated to loose weight and stop taking any unnecessary medications.   Do you have any problems getting your medications? Patient stated no.  Is there anything that you would like to discuss during the appointment? Patient stated  no.  Please bring medications and supplements to appointment, patient reminded about OTP appointment on 4/13 at 78 am.  Follow-Up:Pharmacist Review  Charlann Lange, RMA Clinical Pharmacist Assistant [phone]  5 minutes spent in review, coordination, and documentation.  Reviewed by: Beverly Milch, PharmD Clinical Pharmacist [facility] [phone]

## 2020-04-16 ENCOUNTER — Ambulatory Visit: Payer: Medicare Other | Admitting: Pharmacist

## 2020-04-16 DIAGNOSIS — M8588 Other specified disorders of bone density and structure, other site: Secondary | ICD-10-CM

## 2020-04-16 DIAGNOSIS — I10 Essential (primary) hypertension: Secondary | ICD-10-CM

## 2020-04-16 DIAGNOSIS — K219 Gastro-esophageal reflux disease without esophagitis: Secondary | ICD-10-CM

## 2020-04-16 DIAGNOSIS — E782 Mixed hyperlipidemia: Secondary | ICD-10-CM

## 2020-04-16 NOTE — Patient Instructions (Addendum)
Visit Information  Goals Addressed            This Visit's Progress   . Track and Manage My Blood Pressure-Hypertension       Timeframe:  Long-Range Goal Priority:  High Start Date: 04/16/20                            Expected End Date: 10/16/20                      Follow Up Date 08/03/20   - check blood pressure 3 times per week - choose a place to take my blood pressure (home, clinic or office, retail store) - write blood pressure results in a log or diary    Why is this important?    You won't feel high blood pressure, but it can still hurt your blood vessels.   High blood pressure can cause heart or kidney problems. It can also cause a stroke.   Making lifestyle changes like losing a little weight or eating less salt will help.   Checking your blood pressure at home and at different times of the day can help to control blood pressure.   If the doctor prescribes medicine remember to take it the way the doctor ordered.   Call the office if you cannot afford the medicine or if there are questions about it.     Notes:       Patient Care Plan: General Pharmacy (Adult)    Problem Identified: HTN, GERD, HLD, Osteopenia   Priority: High  Onset Date: 04/16/2020    Long-Range Goal: Patient-Specific Goal   Start Date: 04/16/2020  Expected End Date: 10/16/2020  This Visit's Progress: On track  Priority: High  Note:   Current Barriers:  . No specific barriers identified  Pharmacist Clinical Goal(s):  Marland Kitchen Patient will verbalize ability to afford treatment regimen . maintain control of blood pressure as evidenced by home monitoring  . contact provider office for questions/concerns as evidenced notation of same in electronic health record through collaboration with PharmD and provider.   Interventions: . 1:1 collaboration with Lavera Guise, MD regarding development and update of comprehensive plan of care as evidenced by provider attestation and  co-signature . Inter-disciplinary care team collaboration (see longitudinal plan of care) . Comprehensive medication review performed; medication list updated in electronic medical record  Hypertension (BP goal <140/90) -Controlled -Current treatment: . Amlodipine 2.5mg  daily . Losartan 50mg  daily -Medications previously tried: none noted  -Current home readings: 120/80, 134/90, no other specific readings but reports all < 140/90 -Current exercise habits: she is a member of a gym but does not go often, walks around the store or sometimes around the house -Denies hypotensive/hypertensive symptoms -Educated on BP goals and benefits of medications for prevention of heart attack, stroke and kidney damage; Exercise goal of 150 minutes per week; Importance of home blood pressure monitoring; -Counseled to monitor BP at home daily, document, and provide log at future appointments -Recommended to continue current medication Recommended implement walking plan or go to the gym as tolerated.  Hyperlipidemia: (LDL goal < 100) -Not ideally controlled -Current treatment: . None -Medications previously tried: none noted  -Current exercise habits: see above -Educated on Cholesterol goals;  Benefits of statin for ASCVD risk reduction; Importance of limiting foods high in cholesterol;  -Consider initiating statin therapy - moderate intensity per ASCVD risk %. -Counseled on diet and exercise extensively  Osteopenia (Goal Prevent fractures) -Not ideally controlled -Last DEXA Scan: 01/31/20  T-Score femoral neck: -0.6   T-Score lumbar spine: -1.4  -Patient is not a candidate for pharmacologic treatment -Current treatment  . None -Medications previously tried: none noted  -Recommend [id number] units of vitamin D daily. Recommend 1200 mg of calcium daily from dietary and supplemental sources. Recommend weight-bearing and muscle strengthening exercises for building and maintaining bone density.  -No  FRAX score documented -Recommended patient begin taking Calcium + Vitamin D.  Based off FRAX she could be candidate for bisphosphonate therapy.  Per note from NP, patient has refused this therapy.  Patient wants to discuss wiith NP at next follow up.  GERD (Goal: Minimize symptoms) -Controlled -Current treatment  . Pantoprazole 40mg  daily -Medications previously tried: none noted -Patient reports no symptoms lately.  -Recommended to continue current medication   Patient Goals/Self-Care Activities . Patient will:  - take medications as prescribed focus on medication adherence by fill dates check blood pressure daily, document, and provide at future appointments target a minimum of 150 minutes of moderate intensity exercise weekly  Follow Up Plan: The care management team will reach out to the patient again over the next 180 days.       Ms. Buzan was given information about Chronic Care Management services today including:  1. CCM service includes personalized support from designated clinical staff supervised by her physician, including individualized plan of care and coordination with other care providers 2. 24/7 contact phone numbers for assistance for urgent and routine care needs. 3. Standard insurance, coinsurance, copays and deductibles apply for chronic care management only during months in which we provide at least 20 minutes of these services. Most insurances cover these services at 100%, however patients may be responsible for any copay, coinsurance and/or deductible if applicable. This service may help you avoid the need for more expensive face-to-face services. 4. Only one practitioner may furnish and bill the service in a calendar month. 5. The patient may stop CCM services at any time (effective at the end of the month) by phone call to the office staff.  Patient agreed to services and verbal consent obtained.   The patient verbalized understanding of instructions,  educational materials, and care plan provided today and agreed to receive a mailed copy of patient instructions, educational materials, and care plan.  Telephone follow up appointment with pharmacy team member scheduled for: 6 months  Edythe Clarity, [facility]  High Cholesterol  High cholesterol is a condition in which the blood has high levels of a white, waxy substance similar to fat (cholesterol). The liver makes all the cholesterol that the body needs. The human body needs small amounts of cholesterol to help build cells. A person gets extra or excess cholesterol from the food that he or she eats. The blood carries cholesterol from the liver to the rest of the body. If you have high cholesterol, deposits (plaques) may build up on the walls of your arteries. Arteries are the blood vessels that carry blood away from your heart. These plaques make the arteries narrow and stiff. Cholesterol plaques increase your risk for heart attack and stroke. Work with your health care provider to keep your cholesterol levels in a healthy range. What increases the risk? The following factors may make you more likely to develop this condition:  Eating foods that are high in animal fat (saturated fat) or cholesterol.  Being overweight.  Not getting enough exercise.  A family history of high  cholesterol (familial hypercholesterolemia).  Use of tobacco products.  Having diabetes. What are the signs or symptoms? There are no symptoms of this condition. How is this diagnosed? This condition may be diagnosed based on the results of a blood test.  If you are older than 66 years of age, your health care provider may check your cholesterol levels every 4-6 years.  You may be checked more often if you have high cholesterol or other risk factors for heart disease. The blood test for cholesterol measures:  "Bad" cholesterol, or LDL cholesterol. This is the main type of cholesterol that causes heart disease. The  desired level is less than 100 mg/dL.  "Good" cholesterol, or HDL cholesterol. HDL helps protect against heart disease by cleaning the arteries and carrying the LDL to the liver for processing. The desired level for HDL is 60 mg/dL or higher.  Triglycerides. These are fats that your body can store or burn for energy. The desired level is less than 150 mg/dL.  Total cholesterol. This measures the total amount of cholesterol in your blood and includes LDL, HDL, and triglycerides. The desired level is less than 200 mg/dL. How is this treated? This condition may be treated with:  Diet changes. You may be asked to eat foods that have more fiber and less saturated fats or added sugar.  Lifestyle changes. These may include regular exercise, maintaining a healthy weight, and quitting use of tobacco products.  Medicines. These are given when diet and lifestyle changes have not worked. You may be prescribed a statin medicine to help lower your cholesterol levels. Follow these instructions at home: Eating and drinking  Eat a healthy, balanced diet. This diet includes: ? Daily servings of a variety of fresh, frozen, or canned fruits and vegetables. ? Daily servings of whole grain foods that are rich in fiber. ? Foods that are low in saturated fats and trans fats. These include poultry and fish without skin, lean cuts of meat, and low-fat dairy products. ? A variety of fish, especially oily fish that contain omega-3 fatty acids. Aim to eat fish at least 2 times a week.  Avoid foods and drinks that have added sugar.  Use healthy cooking methods, such as roasting, grilling, broiling, baking, poaching, steaming, and stir-frying. Do not fry your food except for stir-frying.   Lifestyle  Get regular exercise. Aim to exercise for a total of 150 minutes a week. Increase your activity level by doing activities such as gardening, walking, and taking the stairs.  Do not use any products that contain nicotine  or tobacco, such as cigarettes, e-cigarettes, and chewing tobacco. If you need help quitting, ask your health care provider.   General instructions  Take over-the-counter and prescription medicines only as told by your health care provider.  Keep all follow-up visits as told by your health care provider. This is important. Where to find more information  American Heart Association: www.heart.org  National Heart, Lung, and Blood Institute: https://wilson-eaton.com/ Contact a health care provider if:  You have trouble achieving or maintaining a healthy diet or weight.  You are starting an exercise program.  You are unable to stop smoking. Get help right away if:  You have chest pain.  You have trouble breathing.  You have any symptoms of a stroke. "BE FAST" is an easy way to remember the main warning signs of a stroke: ? B - Balance. Signs are dizziness, sudden trouble walking, or loss of balance. ? E - Eyes. Signs are  trouble seeing or a sudden change in vision. ? F - Face. Signs are sudden weakness or numbness of the face, or the face or eyelid drooping on one side. ? A - Arms. Signs are weakness or numbness in an arm. This happens suddenly and usually on one side of the body. ? S - Speech. Signs are sudden trouble speaking, slurred speech, or trouble understanding what people say. ? T - Time. Time to call emergency services. Write down what time symptoms started.  You have other signs of a stroke, such as: ? A sudden, severe headache with no known cause. ? Nausea or vomiting. ? Seizure. These symptoms may represent a serious problem that is an emergency. Do not wait to see if the symptoms will go away. Get medical help right away. Call your local emergency services (911 in the [place] Do not drive yourself to the hospital. Summary  Cholesterol plaques increase your risk for heart attack and stroke. Work with your health care provider to keep your cholesterol levels in a healthy  range.  Eat a healthy, balanced diet, get regular exercise, and maintain a healthy weight.  Do not use any products that contain nicotine or tobacco, such as cigarettes, e-cigarettes, and chewing tobacco.  Get help right away if you have any symptoms of a stroke. This information is not intended to replace advice given to you by your health care provider. Make sure you discuss any questions you have with your health care provider. Document Revised: 11/20/2018 Document Reviewed: 11/20/2018 Elsevier Patient Education  2021 [employer].

## 2020-04-18 DIAGNOSIS — I10 Essential (primary) hypertension: Secondary | ICD-10-CM | POA: Diagnosis not present

## 2020-04-18 DIAGNOSIS — N181 Chronic kidney disease, stage 1: Secondary | ICD-10-CM | POA: Diagnosis not present

## 2020-04-18 DIAGNOSIS — R7303 Prediabetes: Secondary | ICD-10-CM | POA: Diagnosis not present

## 2020-04-22 DIAGNOSIS — R7303 Prediabetes: Secondary | ICD-10-CM | POA: Diagnosis not present

## 2020-04-22 DIAGNOSIS — N181 Chronic kidney disease, stage 1: Secondary | ICD-10-CM | POA: Diagnosis not present

## 2020-04-22 DIAGNOSIS — R809 Proteinuria, unspecified: Secondary | ICD-10-CM | POA: Diagnosis not present

## 2020-04-22 DIAGNOSIS — I10 Essential (primary) hypertension: Secondary | ICD-10-CM | POA: Diagnosis not present

## 2020-05-03 DIAGNOSIS — I10 Essential (primary) hypertension: Secondary | ICD-10-CM | POA: Diagnosis not present

## 2020-05-03 DIAGNOSIS — E782 Mixed hyperlipidemia: Secondary | ICD-10-CM | POA: Diagnosis not present

## 2020-05-03 DIAGNOSIS — K219 Gastro-esophageal reflux disease without esophagitis: Secondary | ICD-10-CM | POA: Diagnosis not present

## 2020-06-09 ENCOUNTER — Ambulatory Visit: Payer: Medicare Other | Admitting: Physician Assistant

## 2020-06-14 DIAGNOSIS — Z20822 Contact with and (suspected) exposure to covid-19: Secondary | ICD-10-CM | POA: Diagnosis not present

## 2020-06-17 ENCOUNTER — Telehealth: Payer: Self-pay | Admitting: Pharmacist

## 2020-06-17 NOTE — Progress Notes (Addendum)
Chronic Care Management Pharmacy Assistant   Name: Jamie Powers  MRN: [id number] DOB: November 17, 1955  Reason for Encounter: Disease State For HTN   Conditions to be addressed/monitored: HTN, GERD, HLD, Osteopenia  Recent office visits:  None since 04/16/20  Recent consult visits:  04/22/20 Nephrology Mardelle Matte, MD.For follow-up. Per note: Increase your losartan from 50mg  once a day to 50mg  twice day  Hospital visits:  None since 04/16/20  Medications: Outpatient Encounter Medications as of 06/17/2020  Medication Sig   amLODipine (NORVASC) 2.5 MG tablet Take 1 tablet (2.5 mg total) by mouth daily.   cetirizine (ZYRTEC) 10 MG tablet Take 1 tablet (10 mg total) by mouth daily.   diclofenac Sodium (VOLTAREN) 1 % GEL Apply 4 g topically 4 (four) times daily.   FLUoxetine (PROZAC) 10 MG capsule fluoxetine 10 mg capsule   hydrocortisone 2.5 % cream Apply 1 application topically as needed.   losartan (COZAAR) 50 MG tablet Take 1.5 tablets (75 mg total) by mouth daily.   nystatin cream (MYCOSTATIN) Apply topically 3 (three) times daily.   pantoprazole (PROTONIX) 40 MG tablet Take 1 tablet (40 mg total) by mouth daily.   No facility-administered encounter medications on file as of 06/17/2020.   Reviewed chart prior to disease state call. Spoke with patient regarding BP  Recent Office Vitals: BP Readings from Last 3 Encounters:  03/04/20 (!) 152/88  11/05/19 124/90  07/06/19 (!) 152/98   Pulse Readings from Last 3 Encounters:  03/04/20 90  11/05/19 98  07/06/19 91    Wt Readings from Last 3 Encounters:  03/04/20 215 lb 9.6 oz (97.8 kg)  11/05/19 209 lb (94.8 kg)  07/06/19 212 lb (96.2 kg)     Kidney Function Lab Results  Component Value Date/Time   CREATININE 0.65 03/05/2020 01:38 PM   CREATININE 0.68 05/22/2018 11:11 AM   GFRNONAA 93 05/22/2018 11:11 AM   GFRAA 107 05/22/2018 11:11 AM    BMP Latest Ref Rng & Units 03/05/2020 05/22/2018 05/05/2017  Glucose 65 -  99 mg/dL 99 98 92  BUN 8 - 27 mg/dL 15 16 14   Creatinine 0.57 - 1.00 mg/dL 0.65 0.68 0.67  BUN/Creat Ratio 12 - 28 23 24 21   Sodium 134 - 144 mmol/L 140 141 141  Potassium 3.5 - 5.2 mmol/L 4.4 4.5 4.6  Chloride 96 - 106 mmol/L 101 103 102  CO2 20 - 29 mmol/L 25 26 25   Calcium 8.7 - 10.3 mg/dL 9.4 9.1 9.6    Current antihypertensive regimen:  Amlodipine 2.5 mg 1 tablet daily Losartan 50 mg twice daily   How often are you checking your Blood Pressure? Patient stated twice daily  Current home BP readings: Patient stated her blood pressure ranges around the 120/80s-130/90.  What recent interventions/DTPs have been made by any provider to improve Blood Pressure control since last CPP Visit: None.  Any recent hospitalizations or ED visits since last visit with CPP? Patient stated no.  What diet changes have been made to improve Blood Pressure Control?  Patient stated she eats mostly salads and cut back on starches like bread.   What exercise is being done to improve your Blood Pressure Control?  Patient stated she walks daily.   Adherence Review: Is the patient currently on ACE/ARB medication? Losartan 50 mg  Does the patient have >5 day gap between last estimated fill dates? Per misc reports, no.  Star Rating Drugs: Losartan 50 mg  90 DS 04/01/20  Follow-Up:Pharmacist Review  Charlann Lange, RMA Clinical Pharmacist Assistant [phone]  10 minutes spent in review, coordination, and documentation.  Reviewed by: Beverly Milch, PharmD Clinical Pharmacist [facility] [phone]

## 2020-06-23 ENCOUNTER — Ambulatory Visit (INDEPENDENT_AMBULATORY_CARE_PROVIDER_SITE_OTHER): Payer: Medicare Other | Admitting: Physician Assistant

## 2020-06-23 ENCOUNTER — Encounter: Payer: Self-pay | Admitting: Physician Assistant

## 2020-06-23 ENCOUNTER — Other Ambulatory Visit: Payer: Self-pay

## 2020-06-23 VITALS — BP 138/90 | HR 85 | Temp 97.1°F | Resp 16 | Ht 59.5 in | Wt 218.2 lb

## 2020-06-23 DIAGNOSIS — Z0001 Encounter for general adult medical examination with abnormal findings: Secondary | ICD-10-CM

## 2020-06-23 DIAGNOSIS — E782 Mixed hyperlipidemia: Secondary | ICD-10-CM

## 2020-06-23 DIAGNOSIS — R062 Wheezing: Secondary | ICD-10-CM | POA: Diagnosis not present

## 2020-06-23 DIAGNOSIS — J3089 Other allergic rhinitis: Secondary | ICD-10-CM

## 2020-06-23 DIAGNOSIS — Z124 Encounter for screening for malignant neoplasm of cervix: Secondary | ICD-10-CM | POA: Diagnosis not present

## 2020-06-23 DIAGNOSIS — Z23 Encounter for immunization: Secondary | ICD-10-CM

## 2020-06-23 DIAGNOSIS — M8588 Other specified disorders of bone density and structure, other site: Secondary | ICD-10-CM

## 2020-06-23 DIAGNOSIS — R300 Dysuria: Secondary | ICD-10-CM

## 2020-06-23 DIAGNOSIS — I10 Essential (primary) hypertension: Secondary | ICD-10-CM

## 2020-06-23 MED ORDER — ALBUTEROL SULFATE HFA 108 (90 BASE) MCG/ACT IN AERS: 2.0000 | INHALATION_SPRAY | Freq: Four times a day (QID) | RESPIRATORY_TRACT | 0 refills | Status: DC | PRN

## 2020-06-23 MED ORDER — ZOSTER VAC RECOMB ADJUVANTED 50 MCG/0.5ML IM SUSR
0.5000 mL | Freq: Once | INTRAMUSCULAR | 0 refills | Status: AC
Start: 2020-06-23 — End: 2020-06-23

## 2020-06-23 MED ORDER — PREVNAR 20 0.5 ML IM SUSY: 0.5000 mL | PREFILLED_SYRINGE | INTRAMUSCULAR | 0 refills | Status: AC

## 2020-06-23 NOTE — Progress Notes (Signed)
[facility] [place], [place] [address]  Internal MEDICINE  Office Visit Note  Patient Name: Jamie Powers  [id number]  [id number]  Date of Service: 06/24/2020  Chief Complaint  Patient presents with   Medicare Wellness    Discuss vaccines, and meds   Sleep Apnea   Hypertension   Quality Metric Gaps    Shingrix, mammogram     HPI Pt is here for routine health maintenance examination -Pt is up to date on Mammogram, colonoscopy and BMD. -BP at home was 116/83 earlier, nephrology increased her losartan to 50mg  x2 and 2.5 mg amlodipine. -Followed by nephrology for CKD -Hx of some wheezing and is taking zyrtec now. Denies SOB. Never been a smoker -Sometimes gets a little off balance -Hx of OSA and was started on CPAP, however was retested and found to be negative and is no longer on CPAP. Husband says she snores, but no apnea episodes or waking gasping or choking. Sleep overall is not good. Has trouble falling asleep and staying asleep due to ruminating. Listens to the bible and will try to find a sleep timer for this. Discussed sleep hygiene and active thinking exercises -pt is due to shingles and pneumonia vaccines and would like to have these done--orders placed  Current Medication: Outpatient Encounter Medications as of 06/23/2020  Medication Sig   albuterol (VENTOLIN HFA) 108 (90 Base) MCG/ACT inhaler Inhale 2 puffs into the lungs every 6 (six) hours as needed for wheezing or shortness of breath.   amLODipine (NORVASC) 2.5 MG tablet Take 1 tablet (2.5 mg total) by mouth daily.   CALCIUM-VITAMIN D PO Take by mouth.   cetirizine (ZYRTEC) 10 MG tablet Take 1 tablet (10 mg total) by mouth daily.   diclofenac Sodium (VOLTAREN) 1 % GEL Apply 4 g topically 4 (four) times daily.   FLUoxetine (PROZAC) 10 MG capsule fluoxetine 10 mg capsule   hydrocortisone 2.5 % cream Apply 1 application topically as needed.   losartan (COZAAR) 50 MG tablet Take 1.5 tablets  (75 mg total) by mouth daily. (Patient taking differently: Take 50 mg by mouth 2 (two) times daily.)   nystatin cream (MYCOSTATIN) Apply topically 3 (three) times daily.   pantoprazole (PROTONIX) 40 MG tablet Take 1 tablet (40 mg total) by mouth daily.   [DISCONTINUED] pneumococcal 20-Val Conj Vacc (PREVNAR 20) 0.5 ML SUSY Inject 0.5 mLs into the muscle tomorrow at 10 am.   [DISCONTINUED] Zoster Vaccine Adjuvanted [facility]) injection Inject 0.5 mLs into the muscle once.   [EXPIRED] Zoster Vaccine Adjuvanted [facility]) injection Inject 0.5 mLs into the muscle once for 1 dose.   No facility-administered encounter medications on file as of 06/23/2020.    Surgical History: Past Surgical History:  Procedure Laterality Date   BREAST BIOPSY Left 2000   neg   BREAST SURGERY     CHOLECYSTECTOMY     COLONOSCOPY WITH PROPOFOL N/A 01/31/2015   Procedure: COLONOSCOPY WITH PROPOFOL;  Surgeon: Lollie Sails, MD;  Location: [facility] ENDOSCOPY;  Service: Endoscopy;  Laterality: N/A;   TONSILLECTOMY      Medical History: Past Medical History:  Diagnosis Date   Hypertension    Obesity    Protein in urine    Sleep apnea     Family History: Family History  Problem Relation Age of Onset   Breast cancer Paternal Aunt    Hypertension Mother    Heart disease Father       Review of Systems  Constitutional:  Negative for  chills, fatigue and unexpected weight change.  HENT:  Negative for congestion, postnasal drip, rhinorrhea, sneezing and sore throat.   Eyes:  Negative for redness.  Respiratory:  Positive for wheezing. Negative for cough, chest tightness and shortness of breath.   Cardiovascular:  Negative for chest pain and palpitations.  Gastrointestinal:  Negative for abdominal pain, constipation, diarrhea, nausea and vomiting.  Genitourinary:  Negative for dysuria and frequency.  Musculoskeletal:  Negative for arthralgias, back pain, joint swelling and neck pain.  Skin:  Negative for rash.   Allergic/Immunologic: Positive for environmental allergies.  Neurological: Negative.  Negative for tremors and numbness.  Hematological:  Negative for adenopathy. Does not bruise/bleed easily.  Psychiatric/Behavioral:  Negative for behavioral problems (Depression), sleep disturbance and suicidal ideas. The patient is not nervous/anxious.     Vital Signs: BP 138/90 Comment: 157/89  Pulse 85   Temp (!) 97.1 F (36.2 C)   Resp 16   Ht 4' 11.5" (1.511 m)   Wt 218 lb 3.2 oz (99 kg)   SpO2 97%   BMI 43.33 kg/m    Physical Exam Vitals and nursing note reviewed.  Constitutional:      General: She is not in acute distress.    Appearance: She is well-developed. She is obese. She is not diaphoretic.  HENT:     Head: Normocephalic and atraumatic.     Right Ear: External ear normal.     Left Ear: External ear normal.     Nose: Nose normal.     Mouth/Throat:     Pharynx: No oropharyngeal exudate.  Eyes:     General: No scleral icterus.       Right eye: No discharge.        Left eye: No discharge.     Conjunctiva/sclera: Conjunctivae normal.     Pupils: Pupils are equal, round, and reactive to light.  Neck:     Thyroid: No thyromegaly.     Vascular: No JVD.     Trachea: No tracheal deviation.  Cardiovascular:     Rate and Rhythm: Normal rate and regular rhythm.     Heart sounds: Normal heart sounds. No murmur heard.   No friction rub. No gallop.  Pulmonary:     Effort: Pulmonary effort is normal. No respiratory distress.     Breath sounds: Normal breath sounds. No stridor. No wheezing or rales.  Chest:     Chest wall: No tenderness.  Breasts:    Right: Normal. No mass.     Left: Normal. No mass.  Abdominal:     General: Bowel sounds are normal. There is no distension.     Palpations: Abdomen is soft. There is no mass.     Tenderness: There is no abdominal tenderness. There is no guarding or rebound.  Musculoskeletal:        General: No tenderness or deformity. Normal  range of motion.     Cervical back: Normal range of motion and neck supple.  Lymphadenopathy:     Cervical: No cervical adenopathy.  Skin:    General: Skin is warm and dry.     Coloration: Skin is not pale.     Findings: No erythema or rash.  Neurological:     Mental Status: She is alert.     Cranial Nerves: No cranial nerve deficit.     Motor: No abnormal muscle tone.     Coordination: Coordination normal.     Deep Tendon Reflexes: Reflexes are normal and symmetric.  Psychiatric:  Behavior: Behavior normal.        Thought Content: Thought content normal.        Judgment: Judgment normal.     LABS: Recent Results (from the past 2160 hour(s))  UA/M w/rflx Culture, Routine     Status: Abnormal   Collection Time: 06/23/20  3:10 PM   Specimen: Urine   Urine  Result Value Ref Range   Specific Gravity, UA 1.017 1.005 - 1.030   pH, UA 6.5 5.0 - 7.5   Color, UA Yellow Yellow   Appearance Ur Clear Clear   Leukocytes,UA Negative Negative   Protein,UA 2+ (A) Negative/Trace   Glucose, UA Negative Negative   Ketones, UA Negative Negative   RBC, UA Negative Negative   Bilirubin, UA Negative Negative   Urobilinogen, Ur 0.2 0.2 - 1.0 mg/dL   Nitrite, UA Negative Negative   Microscopic Examination See below:     Comment: Microscopic was indicated and was performed.   Urinalysis Reflex Comment     Comment: This specimen will not reflex to a Urine Culture.  Microscopic Examination     Status: None   Collection Time: 06/23/20  3:10 PM   Urine  Result Value Ref Range   WBC, UA None seen 0 - 5 /hpf   RBC None seen 0 - 2 /hpf   Epithelial Cells (non renal) 0-10 0 - 10 /hpf   Casts None seen None seen /lpf   Bacteria, UA None seen None seen/Few        Assessment/Plan: 1. Encounter for general adult medical examination with abnormal findings Reviewed labs from March, patient is up-to-date on mammogram, colonoscopy, BMD  2. Essential hypertension Stable, continue current  therapy with losartan twice daily and 2.5 mg of amlodipine  3. Wheezing Patient reports long history of wheezing with known allergies.  We will order an albuterol inhaler to be used as needed and obtain PFT - Pulmonary Function Test; Future - albuterol (VENTOLIN HFA) 108 (90 Base) MCG/ACT inhaler; Inhale 2 puffs into the lungs every 6 (six) hours as needed for wheezing or shortness of breath.  Dispense: 8 g; Refill: 0  4. Perennial allergic rhinitis Continue antihistamine  5. Osteopenia of lumbar spine Continue calcium and vitamin D supplementation  6. Mixed hyperlipidemia Pt will work on improving diet and exercise and will consider trying fish oil supplement  7. Need for shingles vaccine - Zoster Vaccine Adjuvanted [facility]) injection; Inject 0.5 mLs into the muscle once for 1 dose.  Dispense: 0.5 mL; Refill: 0  8. Need for vaccination against Streptococcus pneumoniae Order sent for prevnar  9. Dysuria - UA/M w/rflx Culture, Routine   General Counseling: Dyann verbalizes understanding of the findings of todays visit and agrees with plan of treatment. I have discussed any further diagnostic evaluation that may be needed or ordered today. We also reviewed her medications today. she has been encouraged to call the office with any questions or concerns that should arise related to todays visit.    Counseling:    Orders Placed This Encounter  Procedures   Microscopic Examination   UA/M w/rflx Culture, Routine   Pulmonary Function Test    Meds ordered this encounter  Medications   albuterol (VENTOLIN HFA) 108 (90 Base) MCG/ACT inhaler    Sig: Inhale 2 puffs into the lungs every 6 (six) hours as needed for wheezing or shortness of breath.    Dispense:  8 g    Refill:  0   Zoster Vaccine Adjuvanted [facility]) injection  Sig: Inject 0.5 mLs into the muscle once for 1 dose.    Dispense:  0.5 mL    Refill:  0    This patient was seen by Drema Dallas, PA-C in  collaboration with Dr. Clayborn Bigness as a part of collaborative care agreement.  Total time spent:40 Minutes  Time spent includes review of chart, medications, test results, and follow up plan with the patient.     Lavera Guise, MD  Internal Medicine

## 2020-06-24 LAB — UA/M W/RFLX CULTURE, ROUTINE
Bilirubin, UA: NEGATIVE
Glucose, UA: NEGATIVE
Ketones, UA: NEGATIVE
Leukocytes,UA: NEGATIVE
Nitrite, UA: NEGATIVE
RBC, UA: NEGATIVE
Specific Gravity, UA: 1.017 (ref 1.005–1.030)
Urobilinogen, Ur: 0.2 mg/dL (ref 0.2–1.0)
pH, UA: 6.5 (ref 5.0–7.5)

## 2020-06-24 LAB — MICROSCOPIC EXAMINATION
Bacteria, UA: NONE SEEN
Casts: NONE SEEN /LPF
RBC, Urine: NONE SEEN /HPF (ref 0–2)
WBC, UA: NONE SEEN /HPF (ref 0–5)

## 2020-06-28 ENCOUNTER — Other Ambulatory Visit: Payer: Self-pay | Admitting: Nurse Practitioner

## 2020-06-28 DIAGNOSIS — I10 Essential (primary) hypertension: Secondary | ICD-10-CM

## 2020-07-03 DIAGNOSIS — K219 Gastro-esophageal reflux disease without esophagitis: Secondary | ICD-10-CM | POA: Diagnosis not present

## 2020-07-03 DIAGNOSIS — I10 Essential (primary) hypertension: Secondary | ICD-10-CM | POA: Diagnosis not present

## 2020-07-03 DIAGNOSIS — E782 Mixed hyperlipidemia: Secondary | ICD-10-CM | POA: Diagnosis not present

## 2020-07-09 ENCOUNTER — Other Ambulatory Visit: Payer: Self-pay | Admitting: Internal Medicine

## 2020-07-16 ENCOUNTER — Ambulatory Visit: Payer: Medicare Other | Admitting: Internal Medicine

## 2020-07-18 ENCOUNTER — Other Ambulatory Visit: Payer: Self-pay | Admitting: Physician Assistant

## 2020-07-18 DIAGNOSIS — M8588 Other specified disorders of bone density and structure, other site: Secondary | ICD-10-CM

## 2020-07-18 DIAGNOSIS — Z0001 Encounter for general adult medical examination with abnormal findings: Secondary | ICD-10-CM

## 2020-07-18 DIAGNOSIS — R062 Wheezing: Secondary | ICD-10-CM

## 2020-07-18 DIAGNOSIS — E782 Mixed hyperlipidemia: Secondary | ICD-10-CM

## 2020-07-18 DIAGNOSIS — R300 Dysuria: Secondary | ICD-10-CM

## 2020-07-18 DIAGNOSIS — I10 Essential (primary) hypertension: Secondary | ICD-10-CM

## 2020-07-18 DIAGNOSIS — J3089 Other allergic rhinitis: Secondary | ICD-10-CM

## 2020-07-18 DIAGNOSIS — Z23 Encounter for immunization: Secondary | ICD-10-CM

## 2020-07-28 ENCOUNTER — Ambulatory Visit: Payer: Medicare Other | Admitting: Physician Assistant

## 2020-07-30 ENCOUNTER — Other Ambulatory Visit: Payer: Self-pay

## 2020-07-30 ENCOUNTER — Ambulatory Visit (INDEPENDENT_AMBULATORY_CARE_PROVIDER_SITE_OTHER): Payer: Medicare Other | Admitting: Internal Medicine

## 2020-07-30 DIAGNOSIS — M8588 Other specified disorders of bone density and structure, other site: Secondary | ICD-10-CM

## 2020-07-30 DIAGNOSIS — R062 Wheezing: Secondary | ICD-10-CM

## 2020-07-30 DIAGNOSIS — I10 Essential (primary) hypertension: Secondary | ICD-10-CM

## 2020-07-30 DIAGNOSIS — J3089 Other allergic rhinitis: Secondary | ICD-10-CM

## 2020-07-30 DIAGNOSIS — E782 Mixed hyperlipidemia: Secondary | ICD-10-CM

## 2020-07-30 DIAGNOSIS — Z0001 Encounter for general adult medical examination with abnormal findings: Secondary | ICD-10-CM

## 2020-07-30 DIAGNOSIS — Z23 Encounter for immunization: Secondary | ICD-10-CM

## 2020-07-30 DIAGNOSIS — R300 Dysuria: Secondary | ICD-10-CM

## 2020-07-30 DIAGNOSIS — R0602 Shortness of breath: Secondary | ICD-10-CM | POA: Diagnosis not present

## 2020-07-30 LAB — PULMONARY FUNCTION TEST

## 2020-08-01 NOTE — Procedures (Signed)
[facility] [address] [place], [address]    Complete Pulmonary Function Testing Interpretation:  FINDINGS:  Forced vital capacity is mildly decreased.  FEV1 is 1.31 L which is 75% of predicted and is mildly decreased.  F1 FVC ratio is normal.  Total lung capacity is mildly decreased residual volume is decreased.  To monitor lung capacity ratio is increased FRC is decreased.  DLCO was increased.  Postbronchodilator no significant change in FEV1 clinical improvement may occur in the absence of spirometric improvement.  IMPRESSION:  This pulmonary function study is consistent with mild restrictive lung disease clinical correlation is recommended.  Allyne Gee, MD [facility] Medicine Sleep Medicine

## 2020-08-07 ENCOUNTER — Ambulatory Visit: Payer: Medicare Other | Admitting: Physician Assistant

## 2020-08-14 ENCOUNTER — Encounter: Payer: Self-pay | Admitting: Physician Assistant

## 2020-08-14 ENCOUNTER — Ambulatory Visit (INDEPENDENT_AMBULATORY_CARE_PROVIDER_SITE_OTHER): Payer: Medicare Other | Admitting: Physician Assistant

## 2020-08-14 ENCOUNTER — Other Ambulatory Visit: Payer: Self-pay

## 2020-08-14 DIAGNOSIS — J3089 Other allergic rhinitis: Secondary | ICD-10-CM

## 2020-08-14 DIAGNOSIS — J984 Other disorders of lung: Secondary | ICD-10-CM | POA: Diagnosis not present

## 2020-08-14 DIAGNOSIS — I10 Essential (primary) hypertension: Secondary | ICD-10-CM | POA: Diagnosis not present

## 2020-08-14 DIAGNOSIS — E782 Mixed hyperlipidemia: Secondary | ICD-10-CM

## 2020-08-14 MED ORDER — LOSARTAN POTASSIUM 50 MG PO TABS: 50.0000 mg | ORAL_TABLET | Freq: Two times a day (BID) | ORAL | 1 refills | Status: DC

## 2020-08-14 MED ORDER — AMLODIPINE BESYLATE 2.5 MG PO TABS: 2.5000 mg | ORAL_TABLET | Freq: Every day | ORAL | 1 refills | Status: DC

## 2020-08-14 MED ORDER — CETIRIZINE HCL 10 MG PO TABS: 10.0000 mg | ORAL_TABLET | Freq: Every day | ORAL | 3 refills | Status: DC

## 2020-08-14 NOTE — Progress Notes (Signed)
[facility] [place], [place] [address]  Internal MEDICINE  Office Visit Note  Patient Name: Jamie Powers  [id number]  ID:[id number]  Date of Service: 08/14/2020  Chief Complaint  Patient presents with   Follow-up    PFT result    Hypertension   Quality Metric Gaps    Mammogram, next shingrix    HPI Pt is here for routine follow up -Breathing has been good. Reviewed PFT results which showed mild restrictive lung disease. Reports no recent problems and has not had any wheezing or had to use inhaler. -BP at home 130s/80. Improved on recheck in office today. Takes 2 losartan tabs per nephrology's recommendation and 1 tab of norvasc  -Does need refills today -Also mentions that her right thumb has had a spot of tenderness and tingling with certain movements. This started recently and has not been worsening and only happens sometimes. Will continue to monitor and pt may try anti-inflammatory and ice as needed  Current Medication: Outpatient Encounter Medications as of 08/14/2020  Medication Sig   CALCIUM-VITAMIN D PO Take by mouth.   diclofenac Sodium (VOLTAREN) 1 % GEL Apply 4 g topically 4 (four) times daily.   FLUoxetine (PROZAC) 10 MG capsule fluoxetine 10 mg capsule   FLUoxetine (PROZAC) 10 MG tablet TAKE 1 TABLET BY MOUTH EVERY DAY   hydrocortisone 2.5 % cream Apply 1 application topically as needed.   nystatin cream (MYCOSTATIN) Apply topically 3 (three) times daily.   pantoprazole (PROTONIX) 40 MG tablet Take 1 tablet (40 mg total) by mouth daily.   VENTOLIN HFA 108 (90 Base) MCG/ACT inhaler INHALE 2 PUFFS INTO THE LUNGS EVERY 6 HOURS AS NEEDED FOR WHEEZING OR SHORTNESS OF BREATH   [DISCONTINUED] amLODipine (NORVASC) 2.5 MG tablet Take 1 tablet (2.5 mg total) by mouth daily.   [DISCONTINUED] cetirizine (ZYRTEC) 10 MG tablet Take 1 tablet (10 mg total) by mouth daily.   [DISCONTINUED] losartan (COZAAR) 50 MG tablet Take 1.5 tablets (75 mg total) by  mouth daily. (Patient taking differently: Take 50 mg by mouth 2 (two) times daily.)   amLODipine (NORVASC) 2.5 MG tablet Take 1 tablet (2.5 mg total) by mouth daily.   cetirizine (ZYRTEC) 10 MG tablet Take 1 tablet (10 mg total) by mouth daily.   losartan (COZAAR) 50 MG tablet Take 1 tablet (50 mg total) by mouth 2 (two) times daily.   No facility-administered encounter medications on file as of 08/14/2020.    Surgical History: Past Surgical History:  Procedure Laterality Date   BREAST BIOPSY Left 2000   neg   BREAST SURGERY     CHOLECYSTECTOMY     COLONOSCOPY WITH PROPOFOL N/A 01/31/2015   Procedure: COLONOSCOPY WITH PROPOFOL;  Surgeon: Lollie Sails, MD;  Location: [facility] ENDOSCOPY;  Service: Endoscopy;  Laterality: N/A;   TONSILLECTOMY      Medical History: Past Medical History:  Diagnosis Date   Hypertension    Obesity    Protein in urine    Sleep apnea     Family History: Family History  Problem Relation Age of Onset   Breast cancer Paternal Aunt    Hypertension Mother    Heart disease Father     Social History   Socioeconomic History   Marital status: Married    Spouse name: Not on file   Number of children: Not on file   Years of education: Not on file   Highest education level: Not on file  Occupational History   Not  on file  Tobacco Use   Smoking status: Never   Smokeless tobacco: Never  Substance and Sexual Activity   Alcohol use: No   Drug use: No   Sexual activity: Not on file  Other Topics Concern   Not on file  Social History Narrative   Not on file   Social Determinants of Health   Financial Resource Strain: Low Risk    Difficulty of Paying Living Expenses: Not hard at all  Food Insecurity: Not on file  Transportation Needs: Not on file  Physical Activity: Not on file  Stress: Not on file  Social Connections: Not on file  Intimate Partner Violence: Not on file      Review of Systems  Constitutional:  Negative for chills, fatigue  and unexpected weight change.  HENT:  Negative for congestion, postnasal drip, rhinorrhea, sneezing and sore throat.   Eyes:  Negative for redness.  Respiratory:  Negative for cough, chest tightness, shortness of breath and wheezing.   Cardiovascular:  Negative for chest pain and palpitations.  Gastrointestinal:  Negative for abdominal pain, constipation, diarrhea, nausea and vomiting.  Genitourinary:  Negative for dysuria and frequency.  Musculoskeletal:  Positive for arthralgias. Negative for back pain, joint swelling and neck pain.  Skin:  Negative for rash.  Neurological: Negative.  Negative for tremors and numbness.  Hematological:  Negative for adenopathy. Does not bruise/bleed easily.  Psychiatric/Behavioral:  Negative for behavioral problems (Depression), sleep disturbance and suicidal ideas. The patient is not nervous/anxious.    Vital Signs: BP 138/90 Comment: 142/93  Pulse 83   Temp (!) 97 F (36.1 C)   Resp 16   Ht 5' (1.524 m)   Wt 222 lb 6.4 oz (100.9 kg)   SpO2 97%   BMI 43.43 kg/m    Physical Exam Vitals and nursing note reviewed.  Constitutional:      General: She is not in acute distress.    Appearance: She is well-developed. She is obese. She is not diaphoretic.  HENT:     Head: Normocephalic and atraumatic.     Mouth/Throat:     Pharynx: No oropharyngeal exudate.  Eyes:     Pupils: Pupils are equal, round, and reactive to light.  Neck:     Thyroid: No thyromegaly.     Vascular: No JVD.     Trachea: No tracheal deviation.  Cardiovascular:     Rate and Rhythm: Normal rate and regular rhythm.     Heart sounds: Normal heart sounds. No murmur heard.   No friction rub. No gallop.  Pulmonary:     Effort: Pulmonary effort is normal. No respiratory distress.     Breath sounds: No wheezing or rales.  Chest:     Chest wall: No tenderness.  Abdominal:     General: Bowel sounds are normal.     Palpations: Abdomen is soft.  Musculoskeletal:        General:  Normal range of motion.     Cervical back: Normal range of motion and neck supple.  Lymphadenopathy:     Cervical: No cervical adenopathy.  Skin:    General: Skin is warm and dry.  Neurological:     Mental Status: She is alert and oriented to person, place, and time.     Cranial Nerves: No cranial nerve deficit.  Psychiatric:        Behavior: Behavior normal.        Thought Content: Thought content normal.        Judgment:  Judgment normal.       Assessment/Plan: 1. Essential hypertension Stable, continue current medications and home monitoring - amLODipine (NORVASC) 2.5 MG tablet; Take 1 tablet (2.5 mg total) by mouth daily.  Dispense: 90 tablet; Refill: 1 - losartan (COZAAR) 50 MG tablet; Take 1 tablet (50 mg total) by mouth 2 (two) times daily.  Dispense: 180 tablet; Refill: 1  2. Mixed hyperlipidemia Continue to improve diet and exercise  3. Perennial allergic rhinitis Continue to control allergies with zyrtec and may add flonase as needed to help prevent allergies and wheezing from recurring - cetirizine (ZYRTEC) 10 MG tablet; Take 1 tablet (10 mg total) by mouth daily.  Dispense: 90 tablet; Refill: 3  4. Restrictive lung disease Mild, will continue to monitor  General Counseling: floy ohta understanding of the findings of todays visit and agrees with plan of treatment. I have discussed any further diagnostic evaluation that may be needed or ordered today. We also reviewed her medications today. she has been encouraged to call the office with any questions or concerns that should arise related to todays visit.    No orders of the defined types were placed in this encounter.   Meds ordered this encounter  Medications   cetirizine (ZYRTEC) 10 MG tablet    Sig: Take 1 tablet (10 mg total) by mouth daily.    Dispense:  90 tablet    Refill:  3    Please fill with insurance preferred alternative.   amLODipine (NORVASC) 2.5 MG tablet    Sig: Take 1 tablet (2.5  mg total) by mouth daily.    Dispense:  90 tablet    Refill:  1   losartan (COZAAR) 50 MG tablet    Sig: Take 1 tablet (50 mg total) by mouth 2 (two) times daily.    Dispense:  180 tablet    Refill:  1    Please note increase in dosing.    This patient was seen by Drema Dallas, PA-C in collaboration with Dr. Clayborn Bigness as a part of collaborative care agreement.   Total time spent:30 Minutes Time spent includes review of chart, medications, test results, and follow up plan with the patient.      Dr Lavera Guise Internal medicine

## 2020-08-18 ENCOUNTER — Other Ambulatory Visit: Payer: Self-pay | Admitting: Nurse Practitioner

## 2020-08-18 DIAGNOSIS — J3089 Other allergic rhinitis: Secondary | ICD-10-CM

## 2020-08-19 ENCOUNTER — Telehealth: Payer: Self-pay

## 2020-08-25 ENCOUNTER — Ambulatory Visit: Payer: Medicare Other | Admitting: Physician Assistant

## 2020-09-12 ENCOUNTER — Telehealth: Payer: Medicare Other

## 2020-09-19 ENCOUNTER — Telehealth: Payer: Medicare Other

## 2020-10-16 ENCOUNTER — Telehealth: Payer: Self-pay | Admitting: Pharmacist

## 2020-10-16 NOTE — Progress Notes (Addendum)
Chronic Care Management Pharmacy Assistant   Name: Jamie Powers  MRN: [id number] DOB: 1955-05-04  Reason for Encounter: Disease State For HTN.   Conditions to be addressed/monitored: HTN, GERD, HLD, Osteopenia  Recent office visits:  08/14/20 Mylinda Latina, PA-C. For follow-up. CHANGED/DECREASED Losartan to 50 mg 2 times daily. 06/23/20 McDonough, Si Gaul, PA-C. For [employer]. STARTED Albuterol 108 MCG/ACT 2 puffs inhalation every 6 hours PRN.  Recent consult visits:  None since 06/17/20  Hospital visits:  None since 06/17/20  Medications: Outpatient Encounter Medications as of 10/16/2020  Medication Sig   amLODipine (NORVASC) 2.5 MG tablet Take 1 tablet (2.5 mg total) by mouth daily.   CALCIUM-VITAMIN D PO Take by mouth.   cetirizine (ZYRTEC) 10 MG tablet Take 1 tablet (10 mg total) by mouth daily.   diclofenac Sodium (VOLTAREN) 1 % GEL Apply 4 g topically 4 (four) times daily.   FLUoxetine (PROZAC) 10 MG capsule fluoxetine 10 mg capsule   FLUoxetine (PROZAC) 10 MG tablet TAKE 1 TABLET BY MOUTH EVERY DAY   hydrocortisone 2.5 % cream Apply 1 application topically as needed.   losartan (COZAAR) 50 MG tablet Take 1 tablet (50 mg total) by mouth 2 (two) times daily.   nystatin cream (MYCOSTATIN) Apply topically 3 (three) times daily.   pantoprazole (PROTONIX) 40 MG tablet Take 1 tablet (40 mg total) by mouth daily.   VENTOLIN HFA 108 (90 Base) MCG/ACT inhaler INHALE 2 PUFFS INTO THE LUNGS EVERY 6 HOURS AS NEEDED FOR WHEEZING OR SHORTNESS OF BREATH   No facility-administered encounter medications on file as of 10/16/2020.   Reviewed chart prior to disease state call. Spoke with patient regarding BP  Recent Office Vitals: BP Readings from Last 3 Encounters:  08/14/20 138/90  06/23/20 138/90  03/04/20 (!) 152/88   Pulse Readings from Last 3 Encounters:  08/14/20 83  06/23/20 85  03/04/20 90    Wt Readings from Last 3 Encounters:  08/14/20 222 lb 6.4 oz  (100.9 kg)  06/23/20 218 lb 3.2 oz (99 kg)  03/04/20 215 lb 9.6 oz (97.8 kg)     Kidney Function Lab Results  Component Value Date/Time   CREATININE 0.65 03/05/2020 01:38 PM   CREATININE 0.68 05/22/2018 11:11 AM   GFRNONAA 93 05/22/2018 11:11 AM   GFRAA 107 05/22/2018 11:11 AM    BMP Latest Ref Rng & Units 03/05/2020 05/22/2018 05/05/2017  Glucose 65 - 99 mg/dL 99 98 92  BUN 8 - 27 mg/dL 15 16 14   Creatinine 0.57 - 1.00 mg/dL 0.65 0.68 0.67  BUN/Creat Ratio 12 - 28 23 24 21   Sodium 134 - 144 mmol/L 140 141 141  Potassium 3.5 - 5.2 mmol/L 4.4 4.5 4.6  Chloride 96 - 106 mmol/L 101 103 102  CO2 20 - 29 mmol/L 25 26 25   Calcium 8.7 - 10.3 mg/dL 9.4 9.1 9.6    Current antihypertensive regimen:  Amlodipine 2.5 mg 1 tablet daily Losartan 50 mg twice daily   How often are you checking your Blood Pressure? Patient stated 1-2x per week  Current home BP readings: Patient stated her blood pressure ranges around 120/70 122/85  What recent interventions/DTPs have been made by any provider to improve Blood Pressure control since last CPP Visit: None.  Any recent hospitalizations or ED visits since last visit with CPP? Patients chart does not show any hospitalizations or ER visits.   What diet changes have been made to improve Blood Pressure Control?  Patient stated she  eats about 2-3 times a day. She stated she drinks a lot of water.   What exercise is being done to improve your Blood Pressure Control?  Patient stated she is very active.   Adherence Review: Is the patient currently on ACE/ARB medication? Losartan 50 mg  Does the patient have >5 day gap between last estimated fill dates? Per misc rpts, no.   Care Gaps:BP:157/89  Star Rating Drugs: Losartan 50 mg 08/14/20 90 DS.   Follow-Up:Pharmacist Review  Charlann Lange, [place] Pharmacist Assistant [phone]

## 2020-11-03 DIAGNOSIS — E782 Mixed hyperlipidemia: Secondary | ICD-10-CM | POA: Diagnosis not present

## 2020-11-03 DIAGNOSIS — K219 Gastro-esophageal reflux disease without esophagitis: Secondary | ICD-10-CM | POA: Diagnosis not present

## 2020-11-03 DIAGNOSIS — I10 Essential (primary) hypertension: Secondary | ICD-10-CM | POA: Diagnosis not present

## 2020-11-13 ENCOUNTER — Encounter: Payer: Self-pay | Admitting: Physician Assistant

## 2020-11-13 ENCOUNTER — Ambulatory Visit (INDEPENDENT_AMBULATORY_CARE_PROVIDER_SITE_OTHER): Payer: Medicare Other | Admitting: Physician Assistant

## 2020-11-13 ENCOUNTER — Other Ambulatory Visit: Payer: Self-pay

## 2020-11-13 VITALS — BP 130/84 | HR 73 | Temp 97.7°F | Resp 16 | Ht 59.5 in | Wt 218.0 lb

## 2020-11-13 DIAGNOSIS — I10 Essential (primary) hypertension: Secondary | ICD-10-CM

## 2020-11-13 DIAGNOSIS — J984 Other disorders of lung: Secondary | ICD-10-CM

## 2020-11-13 DIAGNOSIS — Z23 Encounter for immunization: Secondary | ICD-10-CM

## 2020-11-13 DIAGNOSIS — Z6841 Body Mass Index (BMI) 40.0 and over, adult: Secondary | ICD-10-CM

## 2020-11-13 MED ORDER — TETANUS-DIPHTH-ACELL PERTUSSIS 5-2.5-18.5 LF-MCG/0.5 IM SUSY
0.5000 mL | PREFILLED_SYRINGE | Freq: Once | INTRAMUSCULAR | 0 refills | Status: AC
Start: 2020-11-13 — End: 2020-11-13

## 2020-11-13 NOTE — Progress Notes (Signed)
[facility] [place], [place] [address]  Internal MEDICINE  Office Visit Note  Patient Name: Jamie Powers  [id number]  [id number]  Date of Service: 11/17/2020  Chief Complaint  Patient presents with   Follow-up   Hypertension    HPI Pt is here for routine follow up -Bp at home has been well controlled at 120/80s -Has not seen nephrology recently -Still taking prozac for hot flashes and this is doing well -breathing has been ok, used inhaler maybe once last month -Sleeping well -Due for tdap--sent to pharmacy  Current Medication: Outpatient Encounter Medications as of 11/13/2020  Medication Sig   amLODipine (NORVASC) 2.5 MG tablet Take 1 tablet (2.5 mg total) by mouth daily.   CALCIUM-VITAMIN D PO Take by mouth.   cetirizine (ZYRTEC) 10 MG tablet Take 1 tablet (10 mg total) by mouth daily.   diclofenac Sodium (VOLTAREN) 1 % GEL Apply 4 g topically 4 (four) times daily.   FLUoxetine (PROZAC) 10 MG capsule fluoxetine 10 mg capsule   FLUoxetine (PROZAC) 10 MG tablet TAKE 1 TABLET BY MOUTH EVERY DAY   hydrocortisone 2.5 % cream Apply 1 application topically as needed.   losartan (COZAAR) 50 MG tablet Take 1 tablet (50 mg total) by mouth 2 (two) times daily.   nystatin cream (MYCOSTATIN) Apply topically 3 (three) times daily.   pantoprazole (PROTONIX) 40 MG tablet Take 1 tablet (40 mg total) by mouth daily.   VENTOLIN HFA 108 (90 Base) MCG/ACT inhaler INHALE 2 PUFFS INTO THE LUNGS EVERY 6 HOURS AS NEEDED FOR WHEEZING OR SHORTNESS OF BREATH   [DISCONTINUED] Tdap (BOOSTRIX) 5-2.5-18.5 LF-MCG/0.5 injection Inject 0.5 mLs into the muscle once.   [EXPIRED] Tdap (BOOSTRIX) 5-2.5-18.5 LF-MCG/0.5 injection Inject 0.5 mLs into the muscle once for 1 dose.   No facility-administered encounter medications on file as of 11/13/2020.    Surgical History: Past Surgical History:  Procedure Laterality Date   BREAST BIOPSY Left 2000   neg   BREAST SURGERY      CHOLECYSTECTOMY     COLONOSCOPY WITH PROPOFOL N/A 01/31/2015   Procedure: COLONOSCOPY WITH PROPOFOL;  Surgeon: Lollie Sails, MD;  Location: [facility] ENDOSCOPY;  Service: Endoscopy;  Laterality: N/A;   TONSILLECTOMY      Medical History: Past Medical History:  Diagnosis Date   Hypertension    Obesity    Protein in urine    Sleep apnea     Family History: Family History  Problem Relation Age of Onset   Breast cancer Paternal Aunt    Hypertension Mother    Heart disease Father     Social History   Socioeconomic History   Marital status: Married    Spouse name: Not on file   Number of children: Not on file   Years of education: Not on file   Highest education level: Not on file  Occupational History   Not on file  Tobacco Use   Smoking status: Never   Smokeless tobacco: Never  Substance and Sexual Activity   Alcohol use: No   Drug use: No   Sexual activity: Not on file  Other Topics Concern   Not on file  Social History Narrative   Not on file   Social Determinants of Health   Financial Resource Strain: Low Risk    Difficulty of Paying Living Expenses: Not hard at all  Food Insecurity: Not on file  Transportation Needs: Not on file  Physical Activity: Not on file  Stress: Not on  file  Social Connections: Not on file  Intimate Partner Violence: Not on file      Review of Systems  Constitutional:  Negative for chills, diaphoresis and fatigue.  HENT:  Negative for ear pain, postnasal drip and sinus pressure.   Eyes:  Negative for photophobia, discharge, redness, itching and visual disturbance.  Respiratory:  Negative for cough, shortness of breath and wheezing.   Cardiovascular:  Negative for chest pain, palpitations and leg swelling.  Gastrointestinal:  Negative for abdominal pain, constipation, diarrhea, nausea and vomiting.  Genitourinary:  Negative for dysuria and flank pain.  Musculoskeletal:  Negative for arthralgias, back pain, gait problem and neck  pain.  Skin:  Negative for color change.  Allergic/Immunologic: Negative for environmental allergies and food allergies.  Neurological:  Negative for dizziness and headaches.  Hematological:  Does not bruise/bleed easily.  Psychiatric/Behavioral:  Negative for agitation, behavioral problems (depression) and hallucinations.    Vital Signs: BP 130/84   Pulse 73   Temp 97.7 F (36.5 C)   Resp 16   Ht 4' 11.5" (1.511 m)   Wt 218 lb (98.9 kg)   SpO2 99%   BMI 43.29 kg/m    Physical Exam Vitals and nursing note reviewed.  Constitutional:      General: She is not in acute distress.    Appearance: She is well-developed. She is obese. She is not diaphoretic.  HENT:     Head: Normocephalic and atraumatic.     Mouth/Throat:     Pharynx: No oropharyngeal exudate.  Eyes:     Pupils: Pupils are equal, round, and reactive to light.  Neck:     Thyroid: No thyromegaly.     Vascular: No JVD.     Trachea: No tracheal deviation.  Cardiovascular:     Rate and Rhythm: Normal rate and regular rhythm.     Heart sounds: Normal heart sounds. No murmur heard.   No friction rub. No gallop.  Pulmonary:     Effort: Pulmonary effort is normal. No respiratory distress.     Breath sounds: No wheezing or rales.  Chest:     Chest wall: No tenderness.  Abdominal:     General: Bowel sounds are normal.     Palpations: Abdomen is soft.  Musculoskeletal:        General: Normal range of motion.     Cervical back: Normal range of motion and neck supple.  Lymphadenopathy:     Cervical: No cervical adenopathy.  Skin:    General: Skin is warm and dry.  Neurological:     Mental Status: She is alert and oriented to person, place, and time.     Cranial Nerves: No cranial nerve deficit.  Psychiatric:        Behavior: Behavior normal.        Thought Content: Thought content normal.        Judgment: Judgment normal.       Assessment/Plan: 1. Essential hypertension Stable, continue current  medications  2. Restrictive lung disease Continue inhaler as needed  3. Need for Tdap vaccination - Tdap ([place]) 5-2.5-18.5 LF-MCG/0.5 injection; Inject 0.5 mLs into the muscle once for 1 dose.  Dispense: 0.5 mL; Refill: 0  4. Morbid obesity with BMI of 40.0-44.9, adult ([place]) Continue to work on diet and exercise   General Counseling: Jubilee verbalizes understanding of the findings of todays visit and agrees with plan of treatment. I have discussed any further diagnostic evaluation that may be needed or ordered today. We  also reviewed her medications today. she has been encouraged to call the office with any questions or concerns that should arise related to todays visit.    No orders of the defined types were placed in this encounter.   Meds ordered this encounter  Medications   Tdap (BOOSTRIX) 5-2.5-18.5 LF-MCG/0.5 injection    Sig: Inject 0.5 mLs into the muscle once for 1 dose.    Dispense:  0.5 mL    Refill:  0    This patient was seen by Drema Dallas, PA-C in collaboration with Dr. Clayborn Bigness as a part of collaborative care agreement.   Total time spent:30 Minutes Time spent includes review of chart, medications, test results, and follow up plan with the patient.      Dr Lavera Guise Internal medicine

## 2020-12-08 DIAGNOSIS — Z20822 Contact with and (suspected) exposure to covid-19: Secondary | ICD-10-CM | POA: Diagnosis not present

## 2020-12-10 DIAGNOSIS — Z20822 Contact with and (suspected) exposure to covid-19: Secondary | ICD-10-CM | POA: Diagnosis not present

## 2021-01-02 ENCOUNTER — Other Ambulatory Visit: Payer: Self-pay | Admitting: Internal Medicine

## 2021-02-07 ENCOUNTER — Other Ambulatory Visit: Payer: Self-pay | Admitting: Physician Assistant

## 2021-02-07 DIAGNOSIS — I10 Essential (primary) hypertension: Secondary | ICD-10-CM

## 2021-02-11 ENCOUNTER — Telehealth: Payer: Self-pay | Admitting: Student-PharmD

## 2021-02-11 NOTE — Progress Notes (Addendum)
Hypertension (HTN) Review Call  Jamie Powers, Jamie Powers [id number] 73 years, Female  DOB: [birth year]  M: [id number]  Hypertension Review (HC) Completed by Charlann Lange on 02/11/2021  Chart Review Is the patient enrolled in RPM with BP Monitor?: No  BP #1 reading (last): 130/84 on: 11/13/2020  BP #2 reading: 138/90 on: 08/13/2020  BP #3 reading: 138/90 on: 06/22/2020  Any of the last 3 BP > 140/90 mmHg?: No  What recent interventions/DTPs have been made by any provider to improve the patient's conditions in the last 3 months?:  Office Visit: 11/13/20 Mylinda Latina, PA-C For Tdap Vaccination. No medication changes.  Any recent hospitalizations or ED visits since last visit with CPP?: No  Adherence Review Adherence rates for STAR metric medications: Losartan 50 mg 11/12/20 90 DS  Adherence rates for medications indicated for disease state being reviewed: Losartan 50 mg 11/12/20 90 DS  Does the patient have >5 day gap between last estimated fill dates for any of the above medications?: No  Disease State Questions Able to connect with the Patient?: Yes  Is the patient monitoring his/her BP?: Yes  How often are you checking your BP?: occasionally  Home BP Reading #1 (most recent): 130/80  Home BP Reading #2: 120/70  Is the patient having any low BP Readings <90/60?: No  Is the patient having any BP readings above >180/100?: No  Is the patient's average BP>140/90?: No  What is your blood pressure goal?: 120/80  Educate patient to inform proper points on checking BP at home:: Do not drink caffeine or smoke a cigarette at least 30 min. prior to checking., Sit with feet flat on the floor, arm at heart level.  What diet changes have you made to improve your Blood Pressure Control?: eating more fruits and vegetables, eating more home-cooked meals  What exercise are you doing to improve your Blood Pressure  Control?: walking  Engagement Notes Charlann Lange  on 02/10/2021 01:43 PM [facility] Chart Review: 6 min 02/10/21 [facility] Assessment call time spent: 9 min 02/11/21 CP Review: [place], [place]  [phone]  Pharmacist Review  Adherence gaps identified?: No Drug Therapy Problems identified?: No Assessment: Controlled  Alena Bills Clinical Pharmacist

## 2021-02-15 ENCOUNTER — Other Ambulatory Visit: Payer: Self-pay | Admitting: Internal Medicine

## 2021-02-15 DIAGNOSIS — K219 Gastro-esophageal reflux disease without esophagitis: Secondary | ICD-10-CM

## 2021-03-03 DIAGNOSIS — K219 Gastro-esophageal reflux disease without esophagitis: Secondary | ICD-10-CM

## 2021-03-03 DIAGNOSIS — I10 Essential (primary) hypertension: Secondary | ICD-10-CM

## 2021-03-03 DIAGNOSIS — E782 Mixed hyperlipidemia: Secondary | ICD-10-CM

## 2021-03-09 ENCOUNTER — Other Ambulatory Visit: Payer: Self-pay

## 2021-03-09 ENCOUNTER — Ambulatory Visit (INDEPENDENT_AMBULATORY_CARE_PROVIDER_SITE_OTHER): Payer: Medicare Other | Admitting: Physician Assistant

## 2021-03-09 ENCOUNTER — Encounter: Payer: Self-pay | Admitting: Physician Assistant

## 2021-03-09 DIAGNOSIS — R5383 Other fatigue: Secondary | ICD-10-CM

## 2021-03-09 DIAGNOSIS — E559 Vitamin D deficiency, unspecified: Secondary | ICD-10-CM

## 2021-03-09 DIAGNOSIS — E782 Mixed hyperlipidemia: Secondary | ICD-10-CM | POA: Diagnosis not present

## 2021-03-09 DIAGNOSIS — N951 Menopausal and female climacteric states: Secondary | ICD-10-CM

## 2021-03-09 DIAGNOSIS — Z1231 Encounter for screening mammogram for malignant neoplasm of breast: Secondary | ICD-10-CM | POA: Diagnosis not present

## 2021-03-09 DIAGNOSIS — I10 Essential (primary) hypertension: Secondary | ICD-10-CM

## 2021-03-09 DIAGNOSIS — E538 Deficiency of other specified B group vitamins: Secondary | ICD-10-CM

## 2021-03-09 NOTE — Progress Notes (Signed)
[facility] ?[address] ?[place], [place] [address] ? ?Internal MEDICINE  ?Office Visit Note ? ?Patient Name: Jamie Powers ? [id number]  ?[id number] ? ?Date of Service: 03/09/2021 ? ?Chief Complaint  ?Patient presents with  ? Follow-up  ? Hypertension  ? ? ?HPI ?Patient is here for routine follow-up ?-BP at home has been around 130/80s, however she states the past week or so she has had a few higher diastolic readings around 90 ?-She currently takes '50mg'$  losartan BID and 2.'5mg'$  amlodipine.  Discussed continuing to monitor and considering echo if diastolic numbers continue to stay elevated ?-Sees nephrology in April  ?-husband is sick currently with cancer, is at home now and states he is doing well. ?-only uses inhaler infequently ?-Takes prozac for her hot flashes and it helps  ?-States she has not gotten her Tdap at the pharmacy yet as she forgot about this ?-due for routine fasting labs prior to CPE next visit ? ?Current Medication: ?Outpatient Encounter Medications as of 03/09/2021  ?Medication Sig  ? amLODipine (NORVASC) 2.5 MG tablet TAKE 1 TABLET(2.5 MG) BY MOUTH DAILY  ? CALCIUM-VITAMIN D PO Take by mouth.  ? cetirizine (ZYRTEC) 10 MG tablet Take 1 tablet (10 mg total) by mouth daily.  ? diclofenac Sodium (VOLTAREN) 1 % GEL Apply 4 g topically 4 (four) times daily.  ? FLUoxetine (PROZAC) 10 MG capsule fluoxetine 10 mg capsule  ? FLUoxetine (PROZAC) 10 MG tablet TAKE 1 TABLET BY MOUTH EVERY DAY  ? hydrocortisone 2.5 % cream Apply 1 application topically as needed.  ? losartan (COZAAR) 50 MG tablet TAKE 1 TABLET(50 MG) BY MOUTH TWICE DAILY  ? nystatin cream (MYCOSTATIN) Apply topically 3 (three) times daily.  ? pantoprazole (PROTONIX) 40 MG tablet TAKE 1 TABLET(40 MG) BY MOUTH DAILY  ? VENTOLIN HFA 108 (90 Base) MCG/ACT inhaler INHALE 2 PUFFS INTO THE LUNGS EVERY 6 HOURS AS NEEDED FOR WHEEZING OR SHORTNESS OF BREATH  ? ?No facility-administered encounter medications on file as of 03/09/2021.   ? ? ?Surgical History: ?Past Surgical History:  ?Procedure Laterality Date  ? BREAST BIOPSY Left 2000  ? neg  ? BREAST SURGERY    ? CHOLECYSTECTOMY    ? COLONOSCOPY WITH PROPOFOL N/A 01/31/2015  ? Procedure: COLONOSCOPY WITH PROPOFOL;  Surgeon: Lollie Sails, MD;  Location: [facility] ENDOSCOPY;  Service: Endoscopy;  Laterality: N/A;  ? TONSILLECTOMY    ? ? ?Medical History: ?Past Medical History:  ?Diagnosis Date  ? Hypertension   ? Obesity   ? Protein in urine   ? Sleep apnea   ? ? ?Family History: ?Family History  ?Problem Relation Age of Onset  ? Breast cancer Paternal Aunt   ? Hypertension Mother   ? Heart disease Father   ? ? ?Social History  ? ?Socioeconomic History  ? Marital status: Married  ?  Spouse name: Not on file  ? Number of children: Not on file  ? Years of education: Not on file  ? Highest education level: Not on file  ?Occupational History  ? Not on file  ?Tobacco Use  ? Smoking status: Never  ? Smokeless tobacco: Never  ?Substance and Sexual Activity  ? Alcohol use: No  ? Drug use: No  ? Sexual activity: Not on file  ?Other Topics Concern  ? Not on file  ?Social History Narrative  ? Not on file  ? ?Social Determinants of Health  ? ?Financial Resource Strain: Low Risk   ? Difficulty of Paying Living Expenses:  Not hard at all  ?Food Insecurity: Not on file  ?Transportation Needs: Not on file  ?Physical Activity: Not on file  ?Stress: Not on file  ?Social Connections: Not on file  ?Intimate Partner Violence: Not on file  ? ? ? ? ?Review of Systems  ?Constitutional:  Negative for chills, diaphoresis and fatigue.  ?HENT:  Negative for ear pain, postnasal drip and sinus pressure.   ?Eyes:  Negative for photophobia, discharge, redness, itching and visual disturbance.  ?Respiratory:  Negative for cough, shortness of breath and wheezing.   ?Cardiovascular:  Negative for chest pain, palpitations and leg swelling.  ?Gastrointestinal:  Negative for abdominal pain, constipation, diarrhea, nausea and vomiting.   ?Genitourinary:  Negative for dysuria and flank pain.  ?Musculoskeletal:  Negative for arthralgias, back pain, gait problem and neck pain.  ?Skin:  Negative for color change.  ?Allergic/Immunologic: Negative for environmental allergies and food allergies.  ?Neurological:  Negative for dizziness and headaches.  ?Hematological:  Does not bruise/bleed easily.  ?Psychiatric/Behavioral:  Negative for agitation, behavioral problems (depression) and hallucinations.   ? ?Vital Signs: ?BP 132/90 Comment: 135/91  Pulse 84   Temp 98.6 ?F (37 ?C)   Resp 16   Ht 4' 11.5" (1.511 m)   Wt 216 lb 9.6 oz (98.2 kg)   SpO2 94%   BMI 43.02 kg/m?  ? ? ?Physical Exam ?Vitals and nursing note reviewed.  ?Constitutional:   ?   General: She is not in acute distress. ?   Appearance: She is well-developed. She is obese. She is not diaphoretic.  ?HENT:  ?   Head: Normocephalic and atraumatic.  ?   Mouth/Throat:  ?   Pharynx: No oropharyngeal exudate.  ?Eyes:  ?   Pupils: Pupils are equal, round, and reactive to light.  ?Neck:  ?   Thyroid: No thyromegaly.  ?   Vascular: No JVD.  ?   Trachea: No tracheal deviation.  ?Cardiovascular:  ?   Rate and Rhythm: Normal rate and regular rhythm.  ?   Heart sounds: Normal heart sounds. No murmur heard. ?  No friction rub. No gallop.  ?Pulmonary:  ?   Effort: Pulmonary effort is normal. No respiratory distress.  ?   Breath sounds: No wheezing or rales.  ?Chest:  ?   Chest wall: No tenderness.  ?Abdominal:  ?   General: Bowel sounds are normal.  ?   Palpations: Abdomen is soft.  ?Musculoskeletal:     ?   General: Normal range of motion.  ?   Cervical back: Normal range of motion and neck supple.  ?Lymphadenopathy:  ?   Cervical: No cervical adenopathy.  ?Skin: ?   General: Skin is warm and dry.  ?Neurological:  ?   Mental Status: She is alert and oriented to person, place, and time.  ?   Cranial Nerves: No cranial nerve deficit.  ?Psychiatric:     ?   Behavior: Behavior normal.     ?   Thought  Content: Thought content normal.     ?   Judgment: Judgment normal.  ? ? ? ? ? ?Assessment/Plan: ?1. Essential hypertension ?Borderline blood pressure in office, patient will continue to monitor closely.  May need to consider echo if diastolic pressures continue to stay elevated and/or increase amlodipine if necessary ? ?2. Vasomotor symptoms due to menopause ?May continue Prozac as before ? ?3. Visit for screening mammogram ?- MM DIGITAL SCREENING BILATERAL; Future ? ?4. Mixed hyperlipidemia ?- Lipid Panel With LDL/HDL Ratio ? ?  5. Vitamin D deficiency ?- VITAMIN D 25 Hydroxy (Vit-D Deficiency, Fractures) ? ?6. Vitamin B12 deficiency ?- B12 and Folate Panel ? ?7. Other fatigue ?- CBC w/Diff/Platelet ?- Comprehensive metabolic panel ?- TSH + free T4 ? ? ?General Counseling: detrice cales understanding of the findings of todays visit and agrees with plan of treatment. I have discussed any further diagnostic evaluation that may be needed or ordered today. We also reviewed her medications today. she has been encouraged to call the office with any questions or concerns that should arise related to todays visit. ? ? ? ?Orders Placed This Encounter  ?Procedures  ? MM DIGITAL SCREENING BILATERAL  ? CBC w/Diff/Platelet  ? Comprehensive metabolic panel  ? Lipid Panel With LDL/HDL Ratio  ? VITAMIN D 25 Hydroxy (Vit-D Deficiency, Fractures)  ? B12 and Folate Panel  ? TSH + free T4  ? ? ?No orders of the defined types were placed in this encounter. ? ? ?This patient was seen by Drema Dallas, PA-C in collaboration with Dr. Clayborn Bigness as a part of collaborative care agreement. ? ? ?Total time spent:30 Minutes ?Time spent includes review of chart, medications, test results, and follow up plan with the patient.  ? ? ? ? ?Dr Lavera Guise ?Internal medicine  ?

## 2021-03-23 DIAGNOSIS — Z1231 Encounter for screening mammogram for malignant neoplasm of breast: Secondary | ICD-10-CM | POA: Diagnosis not present

## 2021-06-03 ENCOUNTER — Telehealth: Payer: Medicare Other

## 2021-06-17 DIAGNOSIS — R7303 Prediabetes: Secondary | ICD-10-CM | POA: Diagnosis not present

## 2021-06-17 DIAGNOSIS — I10 Essential (primary) hypertension: Secondary | ICD-10-CM | POA: Diagnosis not present

## 2021-06-17 DIAGNOSIS — R809 Proteinuria, unspecified: Secondary | ICD-10-CM | POA: Diagnosis not present

## 2021-06-22 ENCOUNTER — Ambulatory Visit: Payer: Medicare Other | Admitting: Nurse Practitioner

## 2021-06-29 ENCOUNTER — Ambulatory Visit: Payer: Medicare Other | Admitting: Physician Assistant

## 2021-06-29 DIAGNOSIS — R5383 Other fatigue: Secondary | ICD-10-CM | POA: Diagnosis not present

## 2021-06-29 DIAGNOSIS — E538 Deficiency of other specified B group vitamins: Secondary | ICD-10-CM | POA: Diagnosis not present

## 2021-06-29 DIAGNOSIS — E559 Vitamin D deficiency, unspecified: Secondary | ICD-10-CM | POA: Diagnosis not present

## 2021-06-29 DIAGNOSIS — E782 Mixed hyperlipidemia: Secondary | ICD-10-CM | POA: Diagnosis not present

## 2021-06-30 ENCOUNTER — Telehealth: Payer: Self-pay

## 2021-06-30 LAB — COMPREHENSIVE METABOLIC PANEL
ALT: 14 IU/L (ref 0–32)
AST: 13 IU/L (ref 0–40)
Albumin/Globulin Ratio: 1.3 (ref 1.2–2.2)
Albumin: 3.9 g/dL (ref 3.8–4.8)
Alkaline Phosphatase: 116 IU/L (ref 44–121)
BUN/Creatinine Ratio: 21 (ref 12–28)
BUN: 16 mg/dL (ref 8–27)
Bilirubin Total: 0.3 mg/dL (ref 0.0–1.2)
CO2: 25 mmol/L (ref 20–29)
Calcium: 9.3 mg/dL (ref 8.7–10.3)
Chloride: 102 mmol/L (ref 96–106)
Creatinine, Ser: 0.75 mg/dL (ref 0.57–1.00)
Globulin, Total: 3.1 g/dL (ref 1.5–4.5)
Glucose: 116 mg/dL — ABNORMAL HIGH (ref 70–99)
Potassium: 4.4 mmol/L (ref 3.5–5.2)
Sodium: 139 mmol/L (ref 134–144)
Total Protein: 7 g/dL (ref 6.0–8.5)
eGFR: 87 mL/min/{1.73_m2} (ref >59)

## 2021-06-30 LAB — CBC WITH DIFFERENTIAL/PLATELET
Basophils Absolute: 0.1 10*3/uL (ref 0.0–0.2)
Basos: 1 %
EOS (ABSOLUTE): 0.2 10*3/uL (ref 0.0–0.4)
Eos: 3 %
Hematocrit: 41.1 % (ref 34.0–46.6)
Hemoglobin: 13.1 g/dL (ref 11.1–15.9)
Immature Grans (Abs): 0 10*3/uL (ref 0.0–0.1)
Immature Granulocytes: 0 %
Lymphocytes Absolute: 2.6 10*3/uL (ref 0.7–3.1)
Lymphs: 37 %
MCH: 28.8 pg (ref 26.6–33.0)
MCHC: 31.9 g/dL (ref 31.5–35.7)
MCV: 90 fL (ref 79–97)
Monocytes Absolute: 0.5 10*3/uL (ref 0.1–0.9)
Monocytes: 8 %
Neutrophils Absolute: 3.6 10*3/uL (ref 1.4–7.0)
Neutrophils: 51 %
Platelets: 353 10*3/uL (ref 150–450)
RBC: 4.55 x10E6/uL (ref 3.77–5.28)
RDW: 12.6 % (ref 11.7–15.4)
WBC: 7.1 10*3/uL (ref 3.4–10.8)

## 2021-06-30 LAB — LIPID PANEL WITH LDL/HDL RATIO
Cholesterol, Total: 183 mg/dL (ref 100–199)
HDL: 52 mg/dL (ref >39)
LDL Chol Calc (NIH): 117 mg/dL — ABNORMAL HIGH (ref 0–99)
LDL/HDL Ratio: 2.3 ratio (ref 0.0–3.2)
Triglycerides: 75 mg/dL (ref 0–149)
VLDL Cholesterol Cal: 14 mg/dL (ref 5–40)

## 2021-06-30 LAB — B12 AND FOLATE PANEL
Folate: 8.7 ng/mL (ref >3.0)
Vitamin B-12: 422 pg/mL (ref 232–1245)

## 2021-06-30 LAB — TSH+FREE T4
Free T4: 1.16 ng/dL (ref 0.82–1.77)
TSH: 2.4 u[IU]/mL (ref 0.450–4.500)

## 2021-06-30 LAB — VITAMIN D 25 HYDROXY (VIT D DEFICIENCY, FRACTURES): Vit D, 25-Hydroxy: 14.9 ng/mL — ABNORMAL LOW (ref 30.0–100.0)

## 2021-07-02 ENCOUNTER — Other Ambulatory Visit: Payer: Self-pay | Admitting: Physician Assistant

## 2021-07-06 ENCOUNTER — Ambulatory Visit (INDEPENDENT_AMBULATORY_CARE_PROVIDER_SITE_OTHER): Payer: Medicare Other | Admitting: Physician Assistant

## 2021-07-06 ENCOUNTER — Encounter: Payer: Self-pay | Admitting: Physician Assistant

## 2021-07-06 VITALS — BP 140/88 | HR 68 | Temp 97.8°F | Resp 16 | Ht 59.5 in | Wt 215.0 lb

## 2021-07-06 DIAGNOSIS — J3089 Other allergic rhinitis: Secondary | ICD-10-CM

## 2021-07-06 DIAGNOSIS — E559 Vitamin D deficiency, unspecified: Secondary | ICD-10-CM | POA: Diagnosis not present

## 2021-07-06 DIAGNOSIS — R7303 Prediabetes: Secondary | ICD-10-CM | POA: Diagnosis not present

## 2021-07-06 DIAGNOSIS — R0989 Other specified symptoms and signs involving the circulatory and respiratory systems: Secondary | ICD-10-CM

## 2021-07-06 DIAGNOSIS — Z0001 Encounter for general adult medical examination with abnormal findings: Secondary | ICD-10-CM

## 2021-07-06 DIAGNOSIS — I10 Essential (primary) hypertension: Secondary | ICD-10-CM | POA: Diagnosis not present

## 2021-07-06 DIAGNOSIS — R300 Dysuria: Secondary | ICD-10-CM | POA: Diagnosis not present

## 2021-07-06 DIAGNOSIS — E782 Mixed hyperlipidemia: Secondary | ICD-10-CM

## 2021-07-06 DIAGNOSIS — Z01419 Encounter for gynecological examination (general) (routine) without abnormal findings: Secondary | ICD-10-CM

## 2021-07-06 DIAGNOSIS — I6523 Occlusion and stenosis of bilateral carotid arteries: Secondary | ICD-10-CM

## 2021-07-06 MED ORDER — ERGOCALCIFEROL 1.25 MG (50000 UT) PO CAPS: ORAL_CAPSULE | ORAL | 3 refills | Status: DC

## 2021-07-06 MED ORDER — CETIRIZINE HCL 10 MG PO TABS: 10.0000 mg | ORAL_TABLET | Freq: Every day | ORAL | 3 refills | Status: DC

## 2021-07-06 MED ORDER — ROSUVASTATIN CALCIUM 5 MG PO TABS: 5.0000 mg | ORAL_TABLET | Freq: Every day | ORAL | 3 refills | Status: DC

## 2021-07-06 NOTE — Progress Notes (Addendum)
[facility] [place], [place] [address]  Internal MEDICINE  Office Visit Note  Patient Name: Jamie Powers  [id number]  [id number]  Date of Service: 07/06/2021  Chief Complaint  Patient presents with   Medicare Wellness   Hypertension     HPI Pt is here for routine health maintenance examination -BP has been better at home, has been more in the 120s/80 now -Labs reviewed showing sugar elevated, cholesterol elevated, and low vitamin D -Upon chart review patient had an A1c checked by her nephrologist on their last set of labs which was 6.0 and is consistent with elevated glucose and puts her in prediabetic range. Will need to work on diet and will monitor this closely. -Due to cholesterol elevation and not improving from last year, will start crestor 2x per week and then may increase to nightly as tolerated. Will check carotid US -Will also start drisdol weekly -sees nephrology on the 12th -mammogram and colonoscopy up to date -needs evidence of physical for insurance and will contact her insurance in a few weeks to check that thye are aware she had it done/notify them of completed physical to determine if any other info needed  Current Medication: Outpatient Encounter Medications as of 07/06/2021  Medication Sig   amLODipine (NORVASC) 2.5 MG tablet TAKE 1 TABLET(2.5 MG) BY MOUTH DAILY   CALCIUM-VITAMIN D PO Take by mouth.   cetirizine (ZYRTEC) 10 MG tablet Take 1 tablet (10 mg total) by mouth daily.   diclofenac Sodium (VOLTAREN) 1 % GEL Apply 4 g topically 4 (four) times daily.   ergocalciferol (DRISDOL) 1.25 MG (50000 UT) capsule Take one cap q week   FLUoxetine (PROZAC) 10 MG tablet TAKE 1 TABLET BY MOUTH EVERY DAY   hydrocortisone 2.5 % cream Apply 1 application topically as needed.   losartan (COZAAR) 50 MG tablet TAKE 1 TABLET(50 MG) BY MOUTH TWICE DAILY   nystatin cream (MYCOSTATIN) Apply topically 3 (three) times daily.   pantoprazole (PROTONIX)  40 MG tablet TAKE 1 TABLET(40 MG) BY MOUTH DAILY   rosuvastatin (CRESTOR) 5 MG tablet Take 1 tablet (5 mg total) by mouth daily.   VENTOLIN HFA 108 (90 Base) MCG/ACT inhaler INHALE 2 PUFFS INTO THE LUNGS EVERY 6 HOURS AS NEEDED FOR WHEEZING OR SHORTNESS OF BREATH   [DISCONTINUED] cetirizine (ZYRTEC) 10 MG tablet Take 1 tablet (10 mg total) by mouth daily.   No facility-administered encounter medications on file as of 07/06/2021.    Surgical History: Past Surgical History:  Procedure Laterality Date   BREAST BIOPSY Left 2000   neg   BREAST SURGERY     CHOLECYSTECTOMY     COLONOSCOPY WITH PROPOFOL N/A 01/31/2015   Procedure: COLONOSCOPY WITH PROPOFOL;  Surgeon: Lollie Sails, MD;  Location: [facility] ENDOSCOPY;  Service: Endoscopy;  Laterality: N/A;   TONSILLECTOMY      Medical History: Past Medical History:  Diagnosis Date   Hypertension    Obesity    Protein in urine    Sleep apnea     Family History: Family History  Problem Relation Age of Onset   Breast cancer Paternal Aunt    Hypertension Mother    Heart disease Father       Review of Systems  Constitutional:  Negative for chills, diaphoresis and fatigue.  HENT:  Negative for ear pain, postnasal drip and sinus pressure.   Eyes:  Negative for photophobia, discharge, redness, itching and visual disturbance.  Respiratory:  Negative for cough, shortness of breath  and wheezing.   Cardiovascular:  Negative for chest pain, palpitations and leg swelling.  Gastrointestinal:  Negative for abdominal pain, constipation, diarrhea, nausea and vomiting.  Genitourinary:  Negative for dysuria and flank pain.  Musculoskeletal:  Negative for arthralgias, back pain, gait problem and neck pain.  Skin:  Negative for color change.  Allergic/Immunologic: Negative for environmental allergies and food allergies.  Neurological:  Negative for dizziness and headaches.  Hematological:  Does not bruise/bleed easily.  Psychiatric/Behavioral:   Negative for agitation, behavioral problems (depression) and hallucinations.      Vital Signs: BP 140/88   Pulse 68   Temp 97.8 F (36.6 C)   Resp 16   Ht 4' 11.5" (1.511 m)   Wt 215 lb (97.5 kg)   SpO2 97%   BMI 42.70 kg/m    Physical Exam Vitals and nursing note reviewed.  Constitutional:      General: She is not in acute distress.    Appearance: She is well-developed. She is obese. She is not diaphoretic.  HENT:     Head: Normocephalic and atraumatic.     Mouth/Throat:     Pharynx: No oropharyngeal exudate.  Eyes:     Pupils: Pupils are equal, round, and reactive to light.  Neck:     Thyroid: No thyromegaly.     Vascular: Carotid bruit present. No JVD.     Trachea: No tracheal deviation.  Cardiovascular:     Rate and Rhythm: Normal rate and regular rhythm.     Heart sounds: Normal heart sounds. No murmur heard.    No friction rub. No gallop.  Pulmonary:     Effort: Pulmonary effort is normal. No respiratory distress.     Breath sounds: No wheezing or rales.  Chest:     Chest wall: No tenderness.  Breasts:    Right: Normal. No mass.     Left: Normal. No mass.  Abdominal:     General: Bowel sounds are normal.     Palpations: Abdomen is soft.     Tenderness: There is no abdominal tenderness.  Musculoskeletal:        General: Normal range of motion.     Cervical back: Normal range of motion and neck supple.  Lymphadenopathy:     Cervical: No cervical adenopathy.  Skin:    General: Skin is warm and dry.  Neurological:     Mental Status: She is alert and oriented to person, place, and time.     Cranial Nerves: No cranial nerve deficit.  Psychiatric:        Behavior: Behavior normal.        Thought Content: Thought content normal.        Judgment: Judgment normal.      LABS: Recent Results (from the past 2160 hour(s))  CBC w/Diff/Platelet     Status: None   Collection Time: 06/29/21  9:57 AM  Result Value Ref Range   WBC 7.1 3.4 - 10.8 x10E3/uL    RBC 4.55 3.77 - 5.28 x10E6/uL   Hemoglobin 13.1 11.1 - 15.9 g/dL   Hematocrit 41.1 34.0 - 46.6 %   MCV 90 79 - 97 fL   MCH 28.8 26.6 - 33.0 pg   MCHC 31.9 31.5 - 35.7 g/dL   RDW 12.6 11.7 - 15.4 %   Platelets 353 150 - 450 x10E3/uL   Neutrophils 51 Not Estab. %   Lymphs 37 Not Estab. %   Monocytes 8 Not Estab. %   Eos 3 Not Estab. %  Basos 1 Not Estab. %   Neutrophils Absolute 3.6 1.4 - 7.0 x10E3/uL   Lymphocytes Absolute 2.6 0.7 - 3.1 x10E3/uL   Monocytes Absolute 0.5 0.1 - 0.9 x10E3/uL   EOS (ABSOLUTE) 0.2 0.0 - 0.4 x10E3/uL   Basophils Absolute 0.1 0.0 - 0.2 x10E3/uL   Immature Granulocytes 0 Not Estab. %   Immature Grans (Abs) 0.0 0.0 - 0.1 x10E3/uL  Comprehensive metabolic panel     Status: Abnormal   Collection Time: 06/29/21  9:57 AM  Result Value Ref Range   Glucose 116 (H) 70 - 99 mg/dL   BUN 16 8 - 27 mg/dL   Creatinine, Ser 0.75 0.57 - 1.00 mg/dL   eGFR 87 >59 mL/min/1.73   BUN/Creatinine Ratio 21 12 - 28   Sodium 139 134 - 144 mmol/L   Potassium 4.4 3.5 - 5.2 mmol/L   Chloride 102 96 - 106 mmol/L   CO2 25 20 - 29 mmol/L   Calcium 9.3 8.7 - 10.3 mg/dL   Total Protein 7.0 6.0 - 8.5 g/dL   Albumin 3.9 3.8 - 4.8 g/dL   Globulin, Total 3.1 1.5 - 4.5 g/dL   Albumin/Globulin Ratio 1.3 1.2 - 2.2   Bilirubin Total 0.3 0.0 - 1.2 mg/dL   Alkaline Phosphatase 116 44 - 121 IU/L   AST 13 0 - 40 IU/L   ALT 14 0 - 32 IU/L  Lipid Panel With LDL/HDL Ratio     Status: Abnormal   Collection Time: 06/29/21  9:57 AM  Result Value Ref Range   Cholesterol, Total 183 100 - 199 mg/dL   Triglycerides 75 0 - 149 mg/dL   HDL 52 >39 mg/dL   VLDL Cholesterol Cal 14 5 - 40 mg/dL   LDL Chol Calc (NIH) 117 (H) 0 - 99 mg/dL   LDL/HDL Ratio 2.3 0.0 - 3.2 ratio    Comment:                                     LDL/HDL Ratio                                             Men  Women                               1/2 Avg.Risk  1.0    1.5                                   Avg.Risk  3.6    3.2                                 2X Avg.Risk  6.2    5.0                                3X Avg.Risk  8.0    6.1   VITAMIN D 25 Hydroxy (Vit-D Deficiency, Fractures)     Status: Abnormal   Collection Time: 06/29/21  9:57 AM  Result Value Ref Range   Vit D, 25-Hydroxy 14.9 (L)  30.0 - 100.0 ng/mL    Comment: Vitamin D deficiency has been defined by the [employer] practice guideline as a level of serum 25-OH vitamin D less than 20 ng/mL (1,2). The Endocrine Society went on to further define vitamin D insufficiency as a level between 21 and 29 ng/mL (2). 1. IOM (Institute of Medicine). 2010. Dietary reference    intakes for calcium and D. [place]: The    [employer]. 2. Holick MF, Binkley [place], Bischoff-Ferrari HA, et al.    Evaluation, treatment, and prevention of vitamin D    deficiency: an Endocrine Society clinical practice    guideline. JCEM. 2011 Jul; 96(7):1911-30.   B12 and Folate Panel     Status: None   Collection Time: 06/29/21  9:57 AM  Result Value Ref Range   Vitamin B-12 422 232 - 1,245 pg/mL   Folate 8.7 >3.0 ng/mL    Comment: A serum folate concentration of less than 3.1 ng/mL is considered to represent clinical deficiency.   TSH + free T4     Status: None   Collection Time: 06/29/21  9:57 AM  Result Value Ref Range   TSH 2.400 0.450 - 4.500 uIU/mL   Free T4 1.16 0.82 - 1.77 ng/dL        Assessment/Plan: 1. Encounter for general adult medical examination with abnormal findings CPE performed, reviewed routine labs, UTD on PHM  2. Essential hypertension Stable, continue current medications  3. Mixed hyperlipidemia Will start crestor 2x per week then may increase to nightly as tolerated while improving diet and exercise - rosuvastatin (CRESTOR) 5 MG tablet; Take 1 tablet (5 mg total) by mouth daily.  Dispense: 90 tablet; Refill: 3  4. Prediabetes A1c was 6.0 on labs by outside provider, will work on diet and  exercise and monitor closely with repeat in 77months. May need to consider medication at that time  5. Vitamin D deficiency - ergocalciferol (DRISDOL) 1.25 MG (50000 UT) capsule; Take one cap q week  Dispense: 12 capsule; Refill: 3  6. Carotid atherosclerosis bilateral // - US Carotid Duplex Bilateral; Future, bruit is present as well on left side   The 10-year ASCVD risk score (Arnett DK, et al., 2019) is: 9.6%   Values used to calculate the score:     Age: 6 years     Sex: Female     Is Non-Hispanic African American: Yes     Diabetic: No     Tobacco smoker: No     Systolic Blood Pressure: 572 mmHg     Is BP treated: Yes     HDL Cholesterol: 52 mg/dL     Total Cholesterol: 183 mg/dL   7. Perennial allergic rhinitis - cetirizine (ZYRTEC) 10 MG tablet; Take 1 tablet (10 mg total) by mouth daily.  Dispense: 90 tablet; Refill: 3  8. Visit for gynecologic examination Breast exam performed  9. Dysuria - UA/M w/rflx Culture, Routine      General Counseling: Makilah verbalizes understanding of the findings of todays visit and agrees with plan of treatment. I have discussed any further diagnostic evaluation that may be needed or ordered today. We also reviewed her medications today. she has been encouraged to call the office with any questions or concerns that should arise related to todays visit.  Counseling: Cardiac risk factor modification:  1. Control blood pressure. 2. Exercise as prescribed. 3. Follow low sodium, low fat diet. and low fat and low cholestrol diet. 4.  Take ASA 83m once a day. 5. Restricted calories diet to lose weight.    Orders Placed This Encounter  Procedures   UKoreaCarotid Duplex Bilateral   UA/M w/rflx Culture, Routine    Meds ordered this encounter  Medications   ergocalciferol (DRISDOL) 1.25 MG (50000 UT) capsule    Sig: Take one cap q week    Dispense:  12 capsule    Refill:  3   rosuvastatin (CRESTOR) 5 MG tablet    Sig: Take 1 tablet (5  mg total) by mouth daily.    Dispense:  90 tablet    Refill:  3   cetirizine (ZYRTEC) 10 MG tablet    Sig: Take 1 tablet (10 mg total) by mouth daily.    Dispense:  90 tablet    Refill:  3    Please fill with insurance preferred alternative.    This patient was seen by LDrema Dallas PA-C in collaboration with Dr. FClayborn Bignessas a part of collaborative care agreement.  Total time spent:35 Minutes  Time spent includes review of chart, medications, test results, and follow up plan with the patient.     FLavera Guise MD  Internal Medicine

## 2021-07-07 LAB — MICROSCOPIC EXAMINATION: Casts: NONE SEEN /lpf

## 2021-07-07 LAB — UA/M W/RFLX CULTURE, ROUTINE
Bilirubin, UA: NEGATIVE
Glucose, UA: NEGATIVE
Leukocytes,UA: NEGATIVE
Nitrite, UA: NEGATIVE
RBC, UA: NEGATIVE
Specific Gravity, UA: >=1.03 — AB (ref 1.005–1.030)
Urobilinogen, Ur: 0.2 mg/dL (ref 0.2–1.0)
pH, UA: 5.5 (ref 5.0–7.5)

## 2021-07-13 DIAGNOSIS — R7303 Prediabetes: Secondary | ICD-10-CM | POA: Diagnosis not present

## 2021-07-13 DIAGNOSIS — I10 Essential (primary) hypertension: Secondary | ICD-10-CM | POA: Diagnosis not present

## 2021-07-13 DIAGNOSIS — R809 Proteinuria, unspecified: Secondary | ICD-10-CM | POA: Diagnosis not present

## 2021-08-02 ENCOUNTER — Other Ambulatory Visit: Payer: Self-pay | Admitting: Physician Assistant

## 2021-08-02 DIAGNOSIS — I10 Essential (primary) hypertension: Secondary | ICD-10-CM

## 2021-08-11 ENCOUNTER — Telehealth: Payer: Self-pay

## 2021-08-11 NOTE — Telephone Encounter (Signed)
Left vm to confirm 08/17/21 appointment-Toni

## 2021-08-17 ENCOUNTER — Ambulatory Visit: Payer: Medicare Other

## 2021-08-17 DIAGNOSIS — I6523 Occlusion and stenosis of bilateral carotid arteries: Secondary | ICD-10-CM | POA: Diagnosis not present

## 2021-08-23 NOTE — Procedures (Signed)
[place], [place] [address]  DATE OF SERVICE: August 17, 2021  CAROTID DOPPLER INTERPRETATION:  Bilateral Carotid Ultrsasound and Color Doppler Examination was performed. The RIGHT CCA shows no significant plaque in the vessel. The LEFT CCA shows no significant plaque in the vessel. There was no significant intimal thickening noted in the RIGHT carotid artery. There was no significant intimal thickening in the LEFT carotid artery.  The RIGHT CCA shows peak systolic velocity of 53 cm per second. The end diastolic velocity is 12 cm per second on the RIGHT side. The RIGHT ICA shows peak systolic velocity of 59 per second. RIGHT sided ICA end diastolic velocity is 15 cm per second. The RIGHT ECA shows a peak systolic velocity of 36 cm per second. The ICA/CCA ratio is calculated to be 1.1. This suggests less than 50% stenosis. The Vertebral Artery shows antegrade flow.  The LEFT CCA shows peak systolic velocity of 70 cm per second. The end diastolic velocity is 11 cm per second on the LEFT side. The LEFT ICA shows peak systolic velocity of 57 per second. LEFT sided ICA end diastolic velocity is 21 cm per second. The LEFT ECA shows a peak systolic velocity of 37 cm per second. The ICA/CCA ratio is calculated to be 0.8. This suggests less than 50% stenosis. The Vertebral Artery shows antegrade flow.   Impression:    The RIGHT CAROTID shows less than 50% stenosis. The LEFT CAROTID shows less than 50% stenosis.  There is no significant plaque formation noted on the LEFT and no significant plaque on the RIGHT  side. Consider a repeat Carotid doppler if clinical situation and symptoms warrant in 6-12 months. Patient should be encouraged to change lifestyles such as smoking cessation, regular exercise and dietary modification. Use of statins in the right clinical setting and ASA is encouraged.  Allyne Gee, MD [facility]

## 2021-08-28 ENCOUNTER — Other Ambulatory Visit: Payer: Self-pay | Admitting: Physician Assistant

## 2021-08-28 ENCOUNTER — Encounter: Payer: Self-pay | Admitting: Physician Assistant

## 2021-08-28 ENCOUNTER — Ambulatory Visit (INDEPENDENT_AMBULATORY_CARE_PROVIDER_SITE_OTHER): Payer: Medicare Other | Admitting: Physician Assistant

## 2021-08-28 VITALS — BP 118/78 | HR 58 | Temp 98.4°F | Resp 16 | Ht 59.5 in | Wt 212.6 lb

## 2021-08-28 DIAGNOSIS — R7303 Prediabetes: Secondary | ICD-10-CM | POA: Diagnosis not present

## 2021-08-28 DIAGNOSIS — J3089 Other allergic rhinitis: Secondary | ICD-10-CM

## 2021-08-28 DIAGNOSIS — E782 Mixed hyperlipidemia: Secondary | ICD-10-CM | POA: Diagnosis not present

## 2021-08-28 DIAGNOSIS — I10 Essential (primary) hypertension: Secondary | ICD-10-CM | POA: Diagnosis not present

## 2021-08-28 NOTE — Progress Notes (Signed)
[facility] [place], [place] [address]  Internal MEDICINE  Office Visit Note  Patient Name: Jamie Powers  [id number]  [id number]  Date of Service: 09/08/2021  Chief Complaint  Patient presents with   Follow-up    U/s   Hypertension    HPI Pt is here for routine follow up to review carotid US -US showed no significant stenosis bilaterally with no significant plaque formation -tolerates crestor nightly and will continue -BP stable -Did have nephrology follow up for proteinuria and was started on jardiance due to an elevated A1c in prediabetic range. She has been tolerating this. Will need to follow A1c  Current Medication: Outpatient Encounter Medications as of 08/28/2021  Medication Sig   amLODipine (NORVASC) 2.5 MG tablet TAKE 1 TABLET(2.5 MG) BY MOUTH DAILY   CALCIUM-VITAMIN D PO Take by mouth.   diclofenac Sodium (VOLTAREN) 1 % GEL Apply 4 g topically 4 (four) times daily.   empagliflozin (JARDIANCE) 10 MG TABS tablet Take by mouth daily.   ergocalciferol (DRISDOL) 1.25 MG (50000 UT) capsule Take one cap q week   FLUoxetine (PROZAC) 10 MG tablet TAKE 1 TABLET BY MOUTH EVERY DAY   hydrocortisone 2.5 % cream Apply 1 application topically as needed.   losartan (COZAAR) 50 MG tablet TAKE 1 TABLET(50 MG) BY MOUTH TWICE DAILY   nystatin cream (MYCOSTATIN) Apply topically 3 (three) times daily.   pantoprazole (PROTONIX) 40 MG tablet TAKE 1 TABLET(40 MG) BY MOUTH DAILY   rosuvastatin (CRESTOR) 5 MG tablet Take 1 tablet (5 mg total) by mouth daily.   VENTOLIN HFA 108 (90 Base) MCG/ACT inhaler INHALE 2 PUFFS INTO THE LUNGS EVERY 6 HOURS AS NEEDED FOR WHEEZING OR SHORTNESS OF BREATH   [DISCONTINUED] cetirizine (ZYRTEC) 10 MG tablet Take 1 tablet (10 mg total) by mouth daily.   No facility-administered encounter medications on file as of 08/28/2021.    Surgical History: Past Surgical History:  Procedure Laterality Date   BREAST BIOPSY Left 2000   neg    BREAST SURGERY     CHOLECYSTECTOMY     COLONOSCOPY WITH PROPOFOL N/A 01/31/2015   Procedure: COLONOSCOPY WITH PROPOFOL;  Surgeon: Lollie Sails, MD;  Location: [facility] ENDOSCOPY;  Service: Endoscopy;  Laterality: N/A;   TONSILLECTOMY      Medical History: Past Medical History:  Diagnosis Date   Hypertension    Obesity    Protein in urine    Sleep apnea     Family History: Family History  Problem Relation Age of Onset   Breast cancer Paternal Aunt    Hypertension Mother    Heart disease Father     Social History   Socioeconomic History   Marital status: Married    Spouse name: Not on file   Number of children: Not on file   Years of education: Not on file   Highest education level: Not on file  Occupational History   Not on file  Tobacco Use   Smoking status: Never   Smokeless tobacco: Never  Substance and Sexual Activity   Alcohol use: No   Drug use: No   Sexual activity: Not on file  Other Topics Concern   Not on file  Social History Narrative   Not on file   Social Determinants of Health   Financial Resource Strain: Low Risk  (04/16/2020)   Overall Financial Resource Strain (CARDIA)    Difficulty of Paying Living Expenses: Not hard at all  Food Insecurity: Not on file  Transportation Needs: Not on file  Physical Activity: Not on file  Stress: Not on file  Social Connections: Not on file  Intimate Partner Violence: Not on file      Review of Systems  Constitutional:  Negative for chills, diaphoresis and fatigue.  HENT:  Negative for ear pain, postnasal drip and sinus pressure.   Eyes:  Negative for photophobia, discharge, redness, itching and visual disturbance.  Respiratory:  Negative for cough, shortness of breath and wheezing.   Cardiovascular:  Negative for chest pain, palpitations and leg swelling.  Gastrointestinal:  Negative for abdominal pain, constipation, diarrhea, nausea and vomiting.  Genitourinary:  Negative for dysuria and flank pain.   Musculoskeletal:  Negative for arthralgias, back pain, gait problem and neck pain.  Skin:  Negative for color change.  Allergic/Immunologic: Negative for environmental allergies and food allergies.  Neurological:  Negative for dizziness and headaches.  Hematological:  Does not bruise/bleed easily.  Psychiatric/Behavioral:  Negative for agitation, behavioral problems (depression) and hallucinations.     Vital Signs: BP 118/78 Comment: 122/90  Pulse (!) 58   Temp 98.4 F (36.9 C)   Resp 16   Ht 4' 11.5" (1.511 m)   Wt 212 lb 9.6 oz (96.4 kg)   SpO2 95%   BMI 42.22 kg/m    Physical Exam Vitals and nursing note reviewed.  Constitutional:      General: She is not in acute distress.    Appearance: She is well-developed. She is obese. She is not diaphoretic.  HENT:     Head: Normocephalic and atraumatic.     Mouth/Throat:     Pharynx: No oropharyngeal exudate.  Eyes:     Pupils: Pupils are equal, round, and reactive to light.  Neck:     Thyroid: No thyromegaly.     Vascular: No JVD.     Trachea: No tracheal deviation.  Cardiovascular:     Rate and Rhythm: Normal rate and regular rhythm.     Heart sounds: Normal heart sounds. No murmur heard.    No friction rub. No gallop.  Pulmonary:     Effort: Pulmonary effort is normal. No respiratory distress.     Breath sounds: No wheezing or rales.  Chest:     Chest wall: No tenderness.  Abdominal:     General: Bowel sounds are normal.     Palpations: Abdomen is soft.  Musculoskeletal:        General: Normal range of motion.     Cervical back: Normal range of motion and neck supple.  Lymphadenopathy:     Cervical: No cervical adenopathy.  Skin:    General: Skin is warm and dry.  Neurological:     Mental Status: She is alert and oriented to person, place, and time.     Cranial Nerves: No cranial nerve deficit.  Psychiatric:        Behavior: Behavior normal.        Thought Content: Thought content normal.        Judgment:  Judgment normal.        Assessment/Plan: 1. Essential hypertension Stable, continue current medications  2. Mixed hyperlipidemia Continue crestor nightly  3. Prediabetes Started on jardiance by nephrology and will need to monitor A1c and will plan to check it next visit if not already done by outside provider   General Counseling: Lin Givens understanding of the findings of todays visit and agrees with plan of treatment. I have discussed any further diagnostic evaluation that may be needed  or ordered today. We also reviewed her medications today. she has been encouraged to call the office with any questions or concerns that should arise related to todays visit.    No orders of the defined types were placed in this encounter.   No orders of the defined types were placed in this encounter.   This patient was seen by Drema Dallas, PA-C in collaboration with Dr. Clayborn Bigness as a part of collaborative care agreement.   Total time spent:30 Minutes Time spent includes review of chart, medications, test results, and follow up plan with the patient.      Dr Lavera Guise Internal medicine

## 2021-08-31 ENCOUNTER — Other Ambulatory Visit: Payer: Medicare Other

## 2021-10-05 ENCOUNTER — Ambulatory Visit (INDEPENDENT_AMBULATORY_CARE_PROVIDER_SITE_OTHER): Payer: Medicare Other | Admitting: Physician Assistant

## 2021-10-05 ENCOUNTER — Encounter: Payer: Self-pay | Admitting: Physician Assistant

## 2021-10-05 VITALS — BP 134/80 | HR 81 | Temp 97.8°F | Resp 16 | Ht 60.0 in | Wt 209.0 lb

## 2021-10-05 DIAGNOSIS — I10 Essential (primary) hypertension: Secondary | ICD-10-CM

## 2021-10-05 DIAGNOSIS — R7303 Prediabetes: Secondary | ICD-10-CM | POA: Diagnosis not present

## 2021-10-05 LAB — POCT GLYCOSYLATED HEMOGLOBIN (HGB A1C): Hemoglobin A1C: 6.2 % — AB (ref 4.0–5.6)

## 2021-10-05 NOTE — Patient Instructions (Signed)

## 2021-10-05 NOTE — Progress Notes (Signed)
[facility] [place], [place] [address]  Internal MEDICINE  Office Visit Note  Patient Name: Jamie Powers  [id number]  [id number]  Date of Service: 10/05/2021  Chief Complaint  Patient presents with   Follow-up   Hypertension    HPI Pt is here for routine follow up -Bp is stable -Continues to take jardiance and is working on changing her diet and limiting sweets and sugary drinks. She knows she needs to continue to work on this and exercise.  -A1c did go up slightly, but still in prediabetic range and will need close monitoring -Given diabetic diet guide to help with meal planning  -Seeing nephrology once per year therefore does not have follow up for awhile  Current Medication: Outpatient Encounter Medications as of 10/05/2021  Medication Sig   amLODipine (NORVASC) 2.5 MG tablet TAKE 1 TABLET(2.5 MG) BY MOUTH DAILY   CALCIUM-VITAMIN D PO Take by mouth.   cetirizine (ZYRTEC) 10 MG tablet TAKE 1 TABLET(10 MG) BY MOUTH DAILY   diclofenac Sodium (VOLTAREN) 1 % GEL Apply 4 g topically 4 (four) times daily.   empagliflozin (JARDIANCE) 10 MG TABS tablet Take by mouth daily.   ergocalciferol (DRISDOL) 1.25 MG (50000 UT) capsule Take one cap q week   FLUoxetine (PROZAC) 10 MG tablet TAKE 1 TABLET BY MOUTH EVERY DAY   hydrocortisone 2.5 % cream Apply 1 application topically as needed.   losartan (COZAAR) 50 MG tablet TAKE 1 TABLET(50 MG) BY MOUTH TWICE DAILY   nystatin cream (MYCOSTATIN) Apply topically 3 (three) times daily.   pantoprazole (PROTONIX) 40 MG tablet TAKE 1 TABLET(40 MG) BY MOUTH DAILY   rosuvastatin (CRESTOR) 5 MG tablet Take 1 tablet (5 mg total) by mouth daily.   VENTOLIN HFA 108 (90 Base) MCG/ACT inhaler INHALE 2 PUFFS INTO THE LUNGS EVERY 6 HOURS AS NEEDED FOR WHEEZING OR SHORTNESS OF BREATH   No facility-administered encounter medications on file as of 10/05/2021.    Surgical History: Past Surgical History:  Procedure Laterality Date    BREAST BIOPSY Left 2000   neg   BREAST SURGERY     CHOLECYSTECTOMY     COLONOSCOPY WITH PROPOFOL N/A 01/31/2015   Procedure: COLONOSCOPY WITH PROPOFOL;  Surgeon: Lollie Sails, MD;  Location: [facility] ENDOSCOPY;  Service: Endoscopy;  Laterality: N/A;   TONSILLECTOMY      Medical History: Past Medical History:  Diagnosis Date   Hypertension    Obesity    Protein in urine    Sleep apnea     Family History: Family History  Problem Relation Age of Onset   Breast cancer Paternal Aunt    Hypertension Mother    Heart disease Father     Social History   Socioeconomic History   Marital status: Married    Spouse name: Not on file   Number of children: Not on file   Years of education: Not on file   Highest education level: Not on file  Occupational History   Not on file  Tobacco Use   Smoking status: Never   Smokeless tobacco: Never  Substance and Sexual Activity   Alcohol use: No   Drug use: No   Sexual activity: Not on file  Other Topics Concern   Not on file  Social History Narrative   Not on file   Social Determinants of Health   Financial Resource Strain: Low Risk  (04/16/2020)   Overall Financial Resource Strain (CARDIA)    Difficulty of Paying Living Expenses:  Not hard at all  Food Insecurity: Not on file  Transportation Needs: Not on file  Physical Activity: Not on file  Stress: Not on file  Social Connections: Not on file  Intimate Partner Violence: Not on file      Review of Systems  Constitutional:  Negative for chills, diaphoresis and fatigue.  HENT:  Negative for ear pain, postnasal drip and sinus pressure.   Eyes:  Negative for photophobia, discharge, redness, itching and visual disturbance.  Respiratory:  Negative for cough, shortness of breath and wheezing.   Cardiovascular:  Negative for chest pain, palpitations and leg swelling.  Gastrointestinal:  Negative for abdominal pain, constipation, diarrhea, nausea and vomiting.  Genitourinary:   Negative for dysuria and flank pain.  Musculoskeletal:  Negative for arthralgias, back pain, gait problem and neck pain.  Skin:  Negative for color change.  Allergic/Immunologic: Negative for environmental allergies and food allergies.  Neurological:  Negative for dizziness and headaches.  Hematological:  Does not bruise/bleed easily.  Psychiatric/Behavioral:  Negative for agitation, behavioral problems (depression) and hallucinations.     Vital Signs: BP 134/80   Pulse 81   Temp 97.8 F (36.6 C)   Resp 16   Ht 5' (1.524 m)   Wt 209 lb (94.8 kg)   SpO2 96%   BMI 40.82 kg/m    Physical Exam Vitals and nursing note reviewed.  Constitutional:      General: She is not in acute distress.    Appearance: Normal appearance. She is well-developed. She is obese. She is not diaphoretic.  HENT:     Head: Normocephalic and atraumatic.     Mouth/Throat:     Pharynx: No oropharyngeal exudate.  Eyes:     Pupils: Pupils are equal, round, and reactive to light.  Neck:     Thyroid: No thyromegaly.     Vascular: No JVD.     Trachea: No tracheal deviation.  Cardiovascular:     Rate and Rhythm: Normal rate and regular rhythm.     Heart sounds: Normal heart sounds. No murmur heard.    No friction rub. No gallop.  Pulmonary:     Effort: Pulmonary effort is normal. No respiratory distress.     Breath sounds: No wheezing or rales.  Chest:     Chest wall: No tenderness.  Abdominal:     General: Bowel sounds are normal.     Palpations: Abdomen is soft.  Musculoskeletal:        General: Normal range of motion.     Cervical back: Normal range of motion and neck supple.  Lymphadenopathy:     Cervical: No cervical adenopathy.  Skin:    General: Skin is warm and dry.  Neurological:     Mental Status: She is alert and oriented to person, place, and time.     Cranial Nerves: No cranial nerve deficit.  Psychiatric:        Behavior: Behavior normal.        Thought Content: Thought content  normal.        Judgment: Judgment normal.        Assessment/Plan: 1. Prediabetes - POCT HgB A1C is 6.2 which is up slightly from 6.0 last check. Will continue jardiance as before and will need to work on diet and exercise. Given diabetic meal guide  2. Essential hypertension Stable, continue current medications   General Counseling: Aleksa verbalizes understanding of the findings of todays visit and agrees with plan of treatment. I have discussed any  further diagnostic evaluation that may be needed or ordered today. We also reviewed her medications today. she has been encouraged to call the office with any questions or concerns that should arise related to todays visit.    Orders Placed This Encounter  Procedures   POCT HgB A1C    No orders of the defined types were placed in this encounter.   This patient was seen by Drema Dallas, PA-C in collaboration with Dr. Clayborn Bigness as a part of collaborative care agreement.   Total time spent:30 Minutes Time spent includes review of chart, medications, test results, and follow up plan with the patient.      Dr Lavera Guise Internal medicine

## 2021-11-02 ENCOUNTER — Encounter: Payer: Self-pay | Admitting: Physician Assistant

## 2021-11-02 ENCOUNTER — Telehealth (INDEPENDENT_AMBULATORY_CARE_PROVIDER_SITE_OTHER): Payer: Medicare Other | Admitting: Physician Assistant

## 2021-11-02 VITALS — Temp 100.0°F | Resp 16 | Ht 60.0 in

## 2021-11-02 DIAGNOSIS — J069 Acute upper respiratory infection, unspecified: Secondary | ICD-10-CM

## 2021-11-02 MED ORDER — AZITHROMYCIN 250 MG PO TABS: ORAL_TABLET | ORAL | 0 refills | Status: DC

## 2021-11-02 NOTE — Progress Notes (Signed)
[facility] [place], [place] [address]  Internal MEDICINE  Telephone Visit  Patient Name: Jamie Powers  [id number]  [id number]  Date of Service: 11/02/2021  I connected with the patient at 10:17 by telephone and verified the patients identity using two identifiers.   I discussed the limitations, risks, security and privacy concerns of performing an evaluation and management service by telephone and the availability of in person appointments. I also discussed with the patient that there may be a patient responsible charge related to the service.  The patient expressed understanding and agrees to proceed.    Chief Complaint  Patient presents with   Telephone Screen    [phone]   Telephone Assessment   Cough    Coughing up green phlegm, negative for Covid   Sore Throat    Patient says "glands feel swollen" and appear swollen, tender to touch   Fever    HPI Pt is here for virtual sick visit -She tested negative for covid -Symptoms started 2 weeks ago, and got better, but then started to get sick again -Experiencing productive cough with green phlegm and has a sore throat. Feels like her glands are swollen. A little wheezing but not SOB. Does have inhaler if needed. -She has been doing Coricidin and tylenol, tea and honey, lemon water to help soothe throat and encouraged to continue.   Current Medication: Outpatient Encounter Medications as of 11/02/2021  Medication Sig   amLODipine (NORVASC) 2.5 MG tablet TAKE 1 TABLET(2.5 MG) BY MOUTH DAILY   azithromycin (ZITHROMAX) 250 MG tablet Take one tab a day for 10 days for uri   CALCIUM-VITAMIN D PO Take by mouth.   cetirizine (ZYRTEC) 10 MG tablet TAKE 1 TABLET(10 MG) BY MOUTH DAILY   diclofenac Sodium (VOLTAREN) 1 % GEL Apply 4 g topically 4 (four) times daily.   empagliflozin (JARDIANCE) 10 MG TABS tablet Take by mouth daily.   ergocalciferol (DRISDOL) 1.25 MG (50000 UT) capsule Take one cap q week    FLUoxetine (PROZAC) 10 MG tablet TAKE 1 TABLET BY MOUTH EVERY DAY   hydrocortisone 2.5 % cream Apply 1 application topically as needed.   losartan (COZAAR) 50 MG tablet TAKE 1 TABLET(50 MG) BY MOUTH TWICE DAILY   nystatin cream (MYCOSTATIN) Apply topically 3 (three) times daily.   pantoprazole (PROTONIX) 40 MG tablet TAKE 1 TABLET(40 MG) BY MOUTH DAILY   rosuvastatin (CRESTOR) 5 MG tablet Take 1 tablet (5 mg total) by mouth daily.   VENTOLIN HFA 108 (90 Base) MCG/ACT inhaler INHALE 2 PUFFS INTO THE LUNGS EVERY 6 HOURS AS NEEDED FOR WHEEZING OR SHORTNESS OF BREATH   No facility-administered encounter medications on file as of 11/02/2021.    Surgical History: Past Surgical History:  Procedure Laterality Date   BREAST BIOPSY Left 2000   neg   BREAST SURGERY     CHOLECYSTECTOMY     COLONOSCOPY WITH PROPOFOL N/A 01/31/2015   Procedure: COLONOSCOPY WITH PROPOFOL;  Surgeon: Lollie Sails, MD;  Location: [facility] ENDOSCOPY;  Service: Endoscopy;  Laterality: N/A;   TONSILLECTOMY      Medical History: Past Medical History:  Diagnosis Date   Hypertension    Obesity    Protein in urine    Sleep apnea     Family History: Family History  Problem Relation Age of Onset   Breast cancer Paternal Aunt    Hypertension Mother    Heart disease Father     Social History   Socioeconomic History  Marital status: Married    Spouse name: Not on file   Number of children: Not on file   Years of education: Not on file   Highest education level: Not on file  Occupational History   Not on file  Tobacco Use   Smoking status: Never   Smokeless tobacco: Never  Substance and Sexual Activity   Alcohol use: No   Drug use: No   Sexual activity: Not on file  Other Topics Concern   Not on file  Social History Narrative   Not on file   Social Determinants of Health   Financial Resource Strain: Low Risk  (04/16/2020)   Overall Financial Resource Strain (CARDIA)    Difficulty of Paying Living  Expenses: Not hard at all  Food Insecurity: Not on file  Transportation Needs: Not on file  Physical Activity: Not on file  Stress: Not on file  Social Connections: Not on file  Intimate Partner Violence: Not on file      Review of Systems  Constitutional:  Negative for fatigue and fever.  HENT:  Positive for congestion, postnasal drip, sore throat and voice change. Negative for mouth sores.   Respiratory:  Positive for cough and wheezing. Negative for shortness of breath.   Cardiovascular:  Negative for chest pain.  Genitourinary:  Negative for flank pain.  Psychiatric/Behavioral: Negative.      Vital Signs: Temp 100 F (37.8 C)   Resp 16   Ht 5' (1.524 m)   BMI 40.82 kg/m    Observation/Objective:  Pt is able to carry out conversation   Assessment/Plan: 1. Upper respiratory tract infection, unspecified type Will start on zpak and continue coricidin, tylenol, and warm tea with honey to soothe throat. Advised to also start nasal spray to help sinus congestion and postnasal drip and to rest and stay well hydrated. - azithromycin (ZITHROMAX) 250 MG tablet; Take one tab a day for 10 days for uri  Dispense: 10 tablet; Refill: 0   General Counseling: Mckayla verbalizes understanding of the findings of today's phone visit and agrees with plan of treatment. I have discussed any further diagnostic evaluation that may be needed or ordered today. We also reviewed her medications today. she has been encouraged to call the office with any questions or concerns that should arise related to todays visit.    No orders of the defined types were placed in this encounter.   Meds ordered this encounter  Medications   azithromycin (ZITHROMAX) 250 MG tablet    Sig: Take one tab a day for 10 days for uri    Dispense:  10 tablet    Refill:  0    Time spent:25 Minutes    Dr Lavera Guise Internal medicine

## 2021-12-28 ENCOUNTER — Other Ambulatory Visit: Payer: Self-pay | Admitting: Physician Assistant

## 2022-01-07 ENCOUNTER — Other Ambulatory Visit: Payer: Self-pay

## 2022-01-07 MED ORDER — EMPAGLIFLOZIN 10 MG PO TABS: 10.0000 mg | ORAL_TABLET | Freq: Every day | ORAL | 1 refills | Status: DC

## 2022-01-11 ENCOUNTER — Telehealth: Payer: Self-pay | Admitting: Physician Assistant

## 2022-01-11 ENCOUNTER — Encounter: Payer: Self-pay | Admitting: Physician Assistant

## 2022-01-11 ENCOUNTER — Ambulatory Visit: Payer: Medicare (Managed Care) | Admitting: Physician Assistant

## 2022-01-11 ENCOUNTER — Other Ambulatory Visit: Payer: Self-pay

## 2022-01-11 VITALS — BP 140/84 | HR 84 | Temp 98.0°F | Resp 16 | Ht 60.0 in | Wt 201.0 lb

## 2022-01-11 DIAGNOSIS — I10 Essential (primary) hypertension: Secondary | ICD-10-CM

## 2022-01-11 DIAGNOSIS — K219 Gastro-esophageal reflux disease without esophagitis: Secondary | ICD-10-CM

## 2022-01-11 DIAGNOSIS — E782 Mixed hyperlipidemia: Secondary | ICD-10-CM | POA: Diagnosis not present

## 2022-01-11 DIAGNOSIS — E559 Vitamin D deficiency, unspecified: Secondary | ICD-10-CM

## 2022-01-11 DIAGNOSIS — R7303 Prediabetes: Secondary | ICD-10-CM

## 2022-01-11 DIAGNOSIS — J3089 Other allergic rhinitis: Secondary | ICD-10-CM

## 2022-01-11 DIAGNOSIS — R809 Proteinuria, unspecified: Secondary | ICD-10-CM | POA: Diagnosis not present

## 2022-01-11 LAB — POCT GLYCOSYLATED HEMOGLOBIN (HGB A1C): Hemoglobin A1C: 6.1 % — AB (ref 4.0–5.6)

## 2022-01-11 MED ORDER — CETIRIZINE HCL 10 MG PO TABS: ORAL_TABLET | ORAL | 3 refills | Status: DC

## 2022-01-11 MED ORDER — EMPAGLIFLOZIN 10 MG PO TABS: 10.0000 mg | ORAL_TABLET | Freq: Every day | ORAL | 1 refills | Status: DC

## 2022-01-11 MED ORDER — AMLODIPINE BESYLATE 2.5 MG PO TABS: ORAL_TABLET | ORAL | 1 refills | Status: DC

## 2022-01-11 MED ORDER — ROSUVASTATIN CALCIUM 5 MG PO TABS: 5.0000 mg | ORAL_TABLET | Freq: Every day | ORAL | 3 refills | Status: DC

## 2022-01-11 MED ORDER — PANTOPRAZOLE SODIUM 40 MG PO TBEC: DELAYED_RELEASE_TABLET | ORAL | 3 refills | Status: DC

## 2022-01-11 MED ORDER — FLUOXETINE HCL 10 MG PO CAPS: 10.0000 mg | ORAL_CAPSULE | Freq: Every day | ORAL | 1 refills | Status: DC

## 2022-01-11 MED ORDER — LOSARTAN POTASSIUM 50 MG PO TABS: ORAL_TABLET | ORAL | 1 refills | Status: DC

## 2022-01-11 MED ORDER — ERGOCALCIFEROL 1.25 MG (50000 UT) PO CAPS: ORAL_CAPSULE | ORAL | 3 refills | Status: DC

## 2022-01-11 NOTE — Progress Notes (Signed)
[facility] [place], [place] [address]  Internal MEDICINE  Office Visit Note  Patient Name: Jamie Powers  [id number]  [id number]  Date of Service: 01/18/2022  Chief Complaint  Patient presents with   Follow-up   Hypertension    HPI Pt is here for follow up -[id number] systolic at home -Did go on a cruise over thanksgiving plus holidays in general so will check sugars -daughter did have covid but she stayed distant and everyone else negative -did not take BP med yet which is why BP a little high and will take it now -States she needs a new nephrologist and requests referral.  -needs all meds switched to new pharmacy  Current Medication: Outpatient Encounter Medications as of 01/11/2022  Medication Sig   CALCIUM-VITAMIN D PO Take by mouth.   diclofenac Sodium (VOLTAREN) 1 % GEL Apply 4 g topically 4 (four) times daily.   hydrocortisone 2.5 % cream Apply 1 application topically as needed.   nystatin cream (MYCOSTATIN) Apply topically 3 (three) times daily.   VENTOLIN HFA 108 (90 Base) MCG/ACT inhaler INHALE 2 PUFFS INTO THE LUNGS EVERY 6 HOURS AS NEEDED FOR WHEEZING OR SHORTNESS OF BREATH   [DISCONTINUED] amLODipine (NORVASC) 2.5 MG tablet TAKE 1 TABLET(2.5 MG) BY MOUTH DAILY   [DISCONTINUED] azithromycin (ZITHROMAX) 250 MG tablet Take one tab a day for 10 days for uri   [DISCONTINUED] cetirizine (ZYRTEC) 10 MG tablet TAKE 1 TABLET(10 MG) BY MOUTH DAILY   [DISCONTINUED] empagliflozin (JARDIANCE) 10 MG TABS tablet Take 1 tablet (10 mg total) by mouth daily.   [DISCONTINUED] ergocalciferol (DRISDOL) 1.25 MG (50000 UT) capsule Take one cap q week   [DISCONTINUED] FLUoxetine (PROZAC) 10 MG capsule TAKE 1 TABLET BY MOUTH EVERY DAY   [DISCONTINUED] losartan (COZAAR) 50 MG tablet TAKE 1 TABLET(50 MG) BY MOUTH TWICE DAILY   [DISCONTINUED] pantoprazole (PROTONIX) 40 MG tablet TAKE 1 TABLET(40 MG) BY MOUTH DAILY   [DISCONTINUED] rosuvastatin (CRESTOR) 5 MG tablet  Take 1 tablet (5 mg total) by mouth daily.   amLODipine (NORVASC) 2.5 MG tablet TAKE 1 TABLET(2.5 MG) BY MOUTH DAILY   cetirizine (ZYRTEC) 10 MG tablet TAKE 1 TABLET(10 MG) BY MOUTH DAILY   empagliflozin (JARDIANCE) 10 MG TABS tablet Take 1 tablet (10 mg total) by mouth daily.   ergocalciferol (DRISDOL) 1.25 MG (50000 UT) capsule Take one cap q week   FLUoxetine (PROZAC) 10 MG capsule Take 1 capsule (10 mg total) by mouth daily.   losartan (COZAAR) 50 MG tablet TAKE 1 TABLET(50 MG) BY MOUTH TWICE DAILY   pantoprazole (PROTONIX) 40 MG tablet TAKE 1 TABLET(40 MG) BY MOUTH DAILY   rosuvastatin (CRESTOR) 5 MG tablet Take 1 tablet (5 mg total) by mouth daily.   No facility-administered encounter medications on file as of 01/11/2022.    Surgical History: Past Surgical History:  Procedure Laterality Date   BREAST BIOPSY Left 2000   neg   BREAST SURGERY     CHOLECYSTECTOMY     COLONOSCOPY WITH PROPOFOL N/A 01/31/2015   Procedure: COLONOSCOPY WITH PROPOFOL;  Surgeon: Lollie Sails, MD;  Location: [facility] ENDOSCOPY;  Service: Endoscopy;  Laterality: N/A;   TONSILLECTOMY      Medical History: Past Medical History:  Diagnosis Date   Hypertension    Obesity    Protein in urine    Sleep apnea     Family History: Family History  Problem Relation Age of Onset   Breast cancer Paternal Aunt    Hypertension  Mother    Heart disease Father     Social History   Socioeconomic History   Marital status: Married    Spouse name: Not on file   Number of children: Not on file   Years of education: Not on file   Highest education level: Not on file  Occupational History   Not on file  Tobacco Use   Smoking status: Never   Smokeless tobacco: Never  Substance and Sexual Activity   Alcohol use: No   Drug use: No   Sexual activity: Not on file  Other Topics Concern   Not on file  Social History Narrative   Not on file   Social Determinants of Health   Financial Resource Strain: Low Risk   (04/16/2020)   Overall Financial Resource Strain (CARDIA)    Difficulty of Paying Living Expenses: Not hard at all  Food Insecurity: Not on file  Transportation Needs: Not on file  Physical Activity: Not on file  Stress: Not on file  Social Connections: Not on file  Intimate Partner Violence: Not on file      Review of Systems  Constitutional:  Negative for chills, diaphoresis and fatigue.  HENT:  Negative for ear pain, postnasal drip and sinus pressure.   Eyes:  Negative for photophobia, discharge, redness, itching and visual disturbance.  Respiratory:  Negative for cough, shortness of breath and wheezing.   Cardiovascular:  Negative for chest pain, palpitations and leg swelling.  Gastrointestinal:  Negative for abdominal pain, constipation, diarrhea, nausea and vomiting.  Genitourinary:  Negative for dysuria and flank pain.  Musculoskeletal:  Negative for arthralgias, back pain, gait problem and neck pain.  Skin:  Negative for color change.  Allergic/Immunologic: Negative for environmental allergies and food allergies.  Neurological:  Negative for dizziness and headaches.  Hematological:  Does not bruise/bleed easily.  Psychiatric/Behavioral:  Negative for agitation, behavioral problems (depression) and hallucinations.     Vital Signs: BP (!) 140/84   Pulse 84   Temp 98 F (36.7 C)   Resp 16   Ht 5' (1.524 m)   Wt 201 lb (91.2 kg)   SpO2 98%   BMI 39.26 kg/m    Physical Exam Vitals and nursing note reviewed.  Constitutional:      General: She is not in acute distress.    Appearance: Normal appearance. She is well-developed. She is obese. She is not diaphoretic.  HENT:     Head: Normocephalic and atraumatic.     Mouth/Throat:     Pharynx: No oropharyngeal exudate.  Eyes:     Pupils: Pupils are equal, round, and reactive to light.  Neck:     Thyroid: No thyromegaly.     Vascular: No JVD.     Trachea: No tracheal deviation.  Cardiovascular:     Rate and  Rhythm: Normal rate and regular rhythm.     Heart sounds: Normal heart sounds. No murmur heard.    No friction rub. No gallop.  Pulmonary:     Effort: Pulmonary effort is normal. No respiratory distress.     Breath sounds: No wheezing or rales.  Chest:     Chest wall: No tenderness.  Abdominal:     General: Bowel sounds are normal.     Palpations: Abdomen is soft.  Musculoskeletal:        General: Normal range of motion.     Cervical back: Normal range of motion and neck supple.  Lymphadenopathy:     Cervical: No cervical adenopathy.  Skin:    General: Skin is warm and dry.  Neurological:     Mental Status: She is alert and oriented to person, place, and time.     Cranial Nerves: No cranial nerve deficit.  Psychiatric:        Behavior: Behavior normal.        Thought Content: Thought content normal.        Judgment: Judgment normal.        Assessment/Plan: 1. Essential hypertension Borderline BP, but has not taken meds yet and will do so now. Normally well controlled. Continue to monitor - amLODipine (NORVASC) 2.5 MG tablet; TAKE 1 TABLET(2.5 MG) BY MOUTH DAILY  Dispense: 90 tablet; Refill: 1 - losartan (COZAAR) 50 MG tablet; TAKE 1 TABLET(50 MG) BY MOUTH TWICE DAILY  Dispense: 180 tablet; Refill: 1  2. Prediabetes - POCT HgB A1C is 6.1 which is improved from 6.2 last visit. Continue Jardiance and working on diet and exercise  3. Proteinuria, unspecified type Needs new nephrologist - Ambulatory referral to Nephrology  4. Gastroesophageal reflux disease without esophagitis - pantoprazole (PROTONIX) 40 MG tablet; TAKE 1 TABLET(40 MG) BY MOUTH DAILY  Dispense: 90 tablet; Refill: 3  5. Mixed hyperlipidemia - rosuvastatin (CRESTOR) 5 MG tablet; Take 1 tablet (5 mg total) by mouth daily.  Dispense: 90 tablet; Refill: 3  6. Vitamin D deficiency - ergocalciferol (DRISDOL) 1.25 MG (50000 UT) capsule; Take one cap q week  Dispense: 12 capsule; Refill: 3  7. Perennial  allergic rhinitis - cetirizine (ZYRTEC) 10 MG tablet; TAKE 1 TABLET(10 MG) BY MOUTH DAILY  Dispense: 90 tablet; Refill: 3   General Counseling: [place] verbalizes understanding of the findings of todays visit and agrees with plan of treatment. I have discussed any further diagnostic evaluation that may be needed or ordered today. We also reviewed her medications today. she has been encouraged to call the office with any questions or concerns that should arise related to todays visit.    Orders Placed This Encounter  Procedures   Ambulatory referral to Nephrology   POCT HgB A1C    Meds ordered this encounter  Medications   amLODipine (NORVASC) 2.5 MG tablet    Sig: TAKE 1 TABLET(2.5 MG) BY MOUTH DAILY    Dispense:  90 tablet    Refill:  1   empagliflozin (JARDIANCE) 10 MG TABS tablet    Sig: Take 1 tablet (10 mg total) by mouth daily.    Dispense:  90 tablet    Refill:  1   FLUoxetine (PROZAC) 10 MG capsule    Sig: Take 1 capsule (10 mg total) by mouth daily.    Dispense:  90 capsule    Refill:  1   losartan (COZAAR) 50 MG tablet    Sig: TAKE 1 TABLET(50 MG) BY MOUTH TWICE DAILY    Dispense:  180 tablet    Refill:  1   pantoprazole (PROTONIX) 40 MG tablet    Sig: TAKE 1 TABLET(40 MG) BY MOUTH DAILY    Dispense:  90 tablet    Refill:  3   rosuvastatin (CRESTOR) 5 MG tablet    Sig: Take 1 tablet (5 mg total) by mouth daily.    Dispense:  90 tablet    Refill:  3   ergocalciferol (DRISDOL) 1.25 MG (50000 UT) capsule    Sig: Take one cap q week    Dispense:  12 capsule    Refill:  3   cetirizine (ZYRTEC) 10 MG tablet  Sig: TAKE 1 TABLET(10 MG) BY MOUTH DAILY    Dispense:  90 tablet    Refill:  3    This patient was seen by Drema Dallas, PA-C in collaboration with Dr. Clayborn Bigness as a part of collaborative care agreement.   Total time spent:30 Minutes Time spent includes review of chart, medications, test results, and follow up plan with the patient.      Dr  Lavera Guise Internal medicine

## 2022-01-11 NOTE — Telephone Encounter (Signed)
[facility] is faxing new form

## 2022-01-12 ENCOUNTER — Telehealth: Payer: Self-pay | Admitting: Physician Assistant

## 2022-01-12 NOTE — Telephone Encounter (Signed)
Awaiting 01/11/21 office notes for Nephrology referral-[place]

## 2022-01-18 ENCOUNTER — Telehealth: Payer: Self-pay | Admitting: Physician Assistant

## 2022-01-18 NOTE — Telephone Encounter (Signed)
Nephrology referral sent via [facility] to [facility]

## 2022-01-20 ENCOUNTER — Telehealth: Payer: Self-pay | Admitting: Physician Assistant

## 2022-01-20 NOTE — Telephone Encounter (Signed)
Nephrology appointment>> 03/16/22 with [facility]

## 2022-01-25 ENCOUNTER — Other Ambulatory Visit: Payer: Self-pay | Admitting: Physician Assistant

## 2022-01-25 ENCOUNTER — Telehealth: Payer: Self-pay

## 2022-01-25 DIAGNOSIS — I10 Essential (primary) hypertension: Secondary | ICD-10-CM

## 2022-01-25 NOTE — Telephone Encounter (Signed)
Faxed [facility] med list with lauren sign

## 2022-02-08 ENCOUNTER — Other Ambulatory Visit: Payer: Self-pay | Admitting: Physician Assistant

## 2022-02-08 DIAGNOSIS — K219 Gastro-esophageal reflux disease without esophagitis: Secondary | ICD-10-CM

## 2022-02-09 ENCOUNTER — Telehealth: Payer: Self-pay | Admitting: Physician Assistant

## 2022-02-10 ENCOUNTER — Other Ambulatory Visit: Payer: Self-pay

## 2022-02-10 DIAGNOSIS — E782 Mixed hyperlipidemia: Secondary | ICD-10-CM

## 2022-02-10 DIAGNOSIS — J3089 Other allergic rhinitis: Secondary | ICD-10-CM

## 2022-02-10 DIAGNOSIS — Z23 Encounter for immunization: Secondary | ICD-10-CM

## 2022-02-10 DIAGNOSIS — M8588 Other specified disorders of bone density and structure, other site: Secondary | ICD-10-CM

## 2022-02-10 DIAGNOSIS — R300 Dysuria: Secondary | ICD-10-CM

## 2022-02-10 DIAGNOSIS — R062 Wheezing: Secondary | ICD-10-CM

## 2022-02-10 DIAGNOSIS — Z0001 Encounter for general adult medical examination with abnormal findings: Secondary | ICD-10-CM

## 2022-02-10 DIAGNOSIS — I10 Essential (primary) hypertension: Secondary | ICD-10-CM

## 2022-02-10 NOTE — Telephone Encounter (Signed)
Spoke with phar they calling about ventolin refills  and also spoke with pt she don't want refill for now

## 2022-02-15 ENCOUNTER — Ambulatory Visit (INDEPENDENT_AMBULATORY_CARE_PROVIDER_SITE_OTHER): Payer: No Typology Code available for payment source | Admitting: Physician Assistant

## 2022-02-15 ENCOUNTER — Encounter: Payer: Self-pay | Admitting: Physician Assistant

## 2022-02-15 VITALS — BP 134/85 | HR 82 | Temp 98.4°F | Resp 16 | Ht 60.0 in | Wt 197.6 lb

## 2022-02-15 DIAGNOSIS — R195 Other fecal abnormalities: Secondary | ICD-10-CM

## 2022-02-15 NOTE — Progress Notes (Signed)
[facility] [place], [place] [address]  Internal MEDICINE  Office Visit Note  Patient Name: Jamie Powers  [id number]  [id number]  Date of Service: 03/02/2022  Chief Complaint  Patient presents with   Stool Color Change    First noticed on Thursday, started green then eventually has turned black.   Abdominal Pain     HPI Pt is here for a sick visit. -Stool started out green on Wednesday and now turned dark over the weekend.  -did take one chewable pepto bismol and may be contributing to dark stool, but is concerned it is still happening after several days -Not takng any NSAIDs, no alcohol -still taking pantoprazole -Abdominal pain is better -given lab slip for occult fecal testing and CBC and will go if continued dark stool in another 1-2 days.  Current Medication:  Outpatient Encounter Medications as of 02/15/2022  Medication Sig   amLODipine (NORVASC) 2.5 MG tablet TAKE 1 TABLET(2.5 MG) BY MOUTH DAILY   CALCIUM-VITAMIN D PO Take by mouth.   cetirizine (ZYRTEC) 10 MG tablet TAKE 1 TABLET(10 MG) BY MOUTH DAILY   diclofenac Sodium (VOLTAREN) 1 % GEL Apply 4 g topically 4 (four) times daily.   empagliflozin (JARDIANCE) 10 MG TABS tablet Take 1 tablet (10 mg total) by mouth daily.   ergocalciferol (DRISDOL) 1.25 MG (50000 UT) capsule Take one cap q week   FLUoxetine (PROZAC) 10 MG capsule Take 1 capsule (10 mg total) by mouth daily.   hydrocortisone 2.5 % cream Apply 1 application topically as needed.   losartan (COZAAR) 50 MG tablet TAKE 1 TABLET(50 MG) BY MOUTH TWICE DAILY   nystatin cream (MYCOSTATIN) Apply topically 3 (three) times daily.   pantoprazole (PROTONIX) 40 MG tablet TAKE 1 TABLET(40 MG) BY MOUTH DAILY   rosuvastatin (CRESTOR) 5 MG tablet Take 1 tablet (5 mg total) by mouth daily.   VENTOLIN HFA 108 (90 Base) MCG/ACT inhaler INHALE 2 PUFFS INTO THE LUNGS EVERY 6 HOURS AS NEEDED FOR WHEEZING OR SHORTNESS OF BREATH   No  facility-administered encounter medications on file as of 02/15/2022.      Medical History: Past Medical History:  Diagnosis Date   Hypertension    Obesity    Protein in urine    Sleep apnea      Vital Signs: BP 134/85   Pulse 82   Temp 98.4 F (36.9 C)   Resp 16   Ht 5' (1.524 m)   Wt 197 lb 9.6 oz (89.6 kg)   SpO2 97%   BMI 38.59 kg/m    Review of Systems  Constitutional:  Negative for fatigue and fever.  HENT:  Negative for congestion, mouth sores and postnasal drip.   Respiratory:  Negative for cough.   Cardiovascular:  Negative for chest pain.  Gastrointestinal:  Positive for diarrhea. Negative for abdominal pain.       Dark stool  Genitourinary:  Negative for flank pain.  Psychiatric/Behavioral: Negative.      Physical Exam Vitals and nursing note reviewed.  Constitutional:      General: She is not in acute distress.    Appearance: Normal appearance. She is well-developed. She is obese. She is not diaphoretic.  HENT:     Head: Normocephalic and atraumatic.     Mouth/Throat:     Pharynx: No oropharyngeal exudate.  Eyes:     Pupils: Pupils are equal, round, and reactive to light.  Neck:     Thyroid: No thyromegaly.  Vascular: No JVD.     Trachea: No tracheal deviation.  Cardiovascular:     Rate and Rhythm: Normal rate and regular rhythm.     Heart sounds: Normal heart sounds.  Pulmonary:     Effort: Pulmonary effort is normal.  Abdominal:     General: Bowel sounds are normal. There is no distension.     Palpations: Abdomen is soft.     Tenderness: There is no abdominal tenderness. There is no guarding.  Musculoskeletal:        General: Normal range of motion.     Cervical back: Normal range of motion and neck supple.  Lymphadenopathy:     Cervical: No cervical adenopathy.  Skin:    General: Skin is warm and dry.  Neurological:     Mental Status: She is alert and oriented to person, place, and time.     Cranial Nerves: No cranial nerve  deficit.  Psychiatric:        Behavior: Behavior normal.        Thought Content: Thought content normal.        Judgment: Judgment normal.       Assessment/Plan: 1. Dark stools Abdominal pain improved, dark stool likely secondary to peptobismol. Given lab slip for fecal occult testing and CBC to ensure no GI bleed.    General Counseling: delmy gaba understanding of the findings of todays visit and agrees with plan of treatment. I have discussed any further diagnostic evaluation that may be needed or ordered today. We also reviewed her medications today. she has been encouraged to call the office with any questions or concerns that should arise related to todays visit.    Counseling:    No orders of the defined types were placed in this encounter.   No orders of the defined types were placed in this encounter.   Time spent:25 Minutes

## 2022-02-19 ENCOUNTER — Other Ambulatory Visit: Payer: Self-pay | Admitting: Physician Assistant

## 2022-02-19 DIAGNOSIS — K921 Melena: Secondary | ICD-10-CM | POA: Diagnosis not present

## 2022-02-21 LAB — CBC WITH DIFFERENTIAL/PLATELET
Basophils Absolute: 0.1 10*3/uL (ref 0.0–0.2)
Basos: 1 %
EOS (ABSOLUTE): 0.2 10*3/uL (ref 0.0–0.4)
Eos: 3 %
Hematocrit: 41 % (ref 34.0–46.6)
Hemoglobin: 13 g/dL (ref 11.1–15.9)
Immature Grans (Abs): 0 10*3/uL (ref 0.0–0.1)
Immature Granulocytes: 0 %
Lymphocytes Absolute: 3 10*3/uL (ref 0.7–3.1)
Lymphs: 39 %
MCH: 29.1 pg (ref 26.6–33.0)
MCHC: 31.7 g/dL (ref 31.5–35.7)
MCV: 92 fL (ref 79–97)
Monocytes Absolute: 0.5 10*3/uL (ref 0.1–0.9)
Monocytes: 6 %
Neutrophils Absolute: 3.9 10*3/uL (ref 1.4–7.0)
Neutrophils: 51 %
Platelets: 341 10*3/uL (ref 150–450)
RBC: 4.46 x10E6/uL (ref 3.77–5.28)
RDW: 12.8 % (ref 11.7–15.4)
WBC: 7.7 10*3/uL (ref 3.4–10.8)

## 2022-02-21 LAB — FECAL OCCULT BLOOD, IMMUNOCHEMICAL: Fecal Occult Bld: NEGATIVE

## 2022-02-23 ENCOUNTER — Telehealth: Payer: Self-pay

## 2022-02-23 NOTE — Telephone Encounter (Signed)
-----   Message from Mylinda Latina, PA-C sent at 02/23/2022  1:10 PM EST ----- Please let her know that her labs came back normal

## 2022-02-23 NOTE — Telephone Encounter (Signed)
Left message for patient regarding normal lab results. 

## 2022-02-24 ENCOUNTER — Telehealth: Payer: Self-pay

## 2022-02-24 NOTE — Telephone Encounter (Signed)
Spoke with pt she still having diarrhea and chills she said she took imodium and doing bland diet and drink plenty of water advised her that I sent message to [facility] but if her symptoms getting worse go to ED

## 2022-02-26 ENCOUNTER — Ambulatory Visit (INDEPENDENT_AMBULATORY_CARE_PROVIDER_SITE_OTHER): Payer: No Typology Code available for payment source | Admitting: Physician Assistant

## 2022-02-26 ENCOUNTER — Encounter: Payer: Self-pay | Admitting: Physician Assistant

## 2022-02-26 VITALS — BP 118/68 | HR 78 | Temp 98.0°F | Resp 16 | Ht 60.0 in | Wt 194.0 lb

## 2022-02-26 DIAGNOSIS — R197 Diarrhea, unspecified: Secondary | ICD-10-CM

## 2022-02-26 DIAGNOSIS — R1084 Generalized abdominal pain: Secondary | ICD-10-CM

## 2022-02-26 MED ORDER — METRONIDAZOLE 500 MG PO TABS: 500.0000 mg | ORAL_TABLET | Freq: Two times a day (BID) | ORAL | 0 refills | Status: DC

## 2022-02-26 MED ORDER — CIPROFLOXACIN HCL 500 MG PO TABS: 500.0000 mg | ORAL_TABLET | Freq: Two times a day (BID) | ORAL | 0 refills | Status: AC

## 2022-02-26 NOTE — Progress Notes (Signed)
[facility] [place],  [address]  Internal MEDICINE  Office Visit Note  Patient Name: Jamie Powers  [id number]  [id number]  Date of Service: 02/26/2022  Chief Complaint  Patient presents with   Acute Visit    Stomach pain     HPI Pt is here for a sick visit. -She was seen last Monday, 2/12 due to dark stools, but at that time was not having active abdominal pain or diarrhea. Dark stool thought to be due to pepto bismol but fecal occult testing and cbc checked and were normal -Now patient has been experiencing Diarrhea that started last Friday and has been Intermittent since then.  -Some generalized abdominal cramping pain is intermittent but may last for stretches at a time -Has been doing bland diet -No recent travel or ABX history which lowers suspicion for cdiff or parasitic infection -No fevers, no blood in stool, and otherwise feeling well -Did try immodium Sunday and helped a little but has not taken again since. -A little nausea but seems to be improved, also no diarrhea today and was improving yesterday and states it was more paste-like -Last colonoscopy does confirm diverticulosis present and may be experiencing diverticulitis now vs GI infection -Does report some other people have similar symptoms at her church but she wasn't directly around them. Possible GI bug going around though -Will go ahead and check further labs and treat with cipro and flagyl while changing to liquid diet and soft food like applesauce and advance slowly. Avoid nuts, seeds, popcorn, and crunchy or spicy foods.  Current Medication:  Outpatient Encounter Medications as of 02/26/2022  Medication Sig   amLODipine (NORVASC) 2.5 MG tablet TAKE 1 TABLET(2.5 MG) BY MOUTH DAILY   CALCIUM-VITAMIN D PO Take by mouth.   cetirizine (ZYRTEC) 10 MG tablet TAKE 1 TABLET(10 MG) BY MOUTH DAILY   ciprofloxacin (CIPRO) 500 MG tablet Take 1 tablet (500 mg total) by mouth 2 (two)  times daily for 10 days.   diclofenac Sodium (VOLTAREN) 1 % GEL Apply 4 g topically 4 (four) times daily.   empagliflozin (JARDIANCE) 10 MG TABS tablet Take 1 tablet (10 mg total) by mouth daily.   ergocalciferol (DRISDOL) 1.25 MG (50000 UT) capsule Take one cap q week   FLUoxetine (PROZAC) 10 MG capsule Take 1 capsule (10 mg total) by mouth daily.   hydrocortisone 2.5 % cream Apply 1 application topically as needed.   losartan (COZAAR) 50 MG tablet TAKE 1 TABLET(50 MG) BY MOUTH TWICE DAILY   metroNIDAZOLE (FLAGYL) 500 MG tablet Take 1 tablet (500 mg total) by mouth 2 (two) times daily.   nystatin cream (MYCOSTATIN) Apply topically 3 (three) times daily.   pantoprazole (PROTONIX) 40 MG tablet TAKE 1 TABLET(40 MG) BY MOUTH DAILY   rosuvastatin (CRESTOR) 5 MG tablet Take 1 tablet (5 mg total) by mouth daily.   VENTOLIN HFA 108 (90 Base) MCG/ACT inhaler INHALE 2 PUFFS INTO THE LUNGS EVERY 6 HOURS AS NEEDED FOR WHEEZING OR SHORTNESS OF BREATH   No facility-administered encounter medications on file as of 02/26/2022.      Medical History: Past Medical History:  Diagnosis Date   Hypertension    Obesity    Protein in urine    Sleep apnea      Vital Signs: BP 118/68   Pulse 78   Temp 98 F (36.7 C)   Resp 16   Ht 5' (1.524 m)   Wt 194 lb (88 kg)   SpO2  93%   BMI 37.89 kg/m    Review of Systems  Constitutional:  Negative for fatigue and fever.  HENT:  Negative for congestion, mouth sores and postnasal drip.   Respiratory:  Negative for cough.   Cardiovascular:  Negative for chest pain.  Gastrointestinal:  Positive for abdominal pain, diarrhea and nausea. Negative for blood in stool and vomiting.  Genitourinary:  Negative for flank pain.  Psychiatric/Behavioral: Negative.      Physical Exam Vitals and nursing note reviewed.  Constitutional:      General: She is not in acute distress.    Appearance: Normal appearance. She is well-developed. She is obese. She is not  diaphoretic.  HENT:     Head: Normocephalic and atraumatic.     Mouth/Throat:     Pharynx: No oropharyngeal exudate.  Eyes:     Pupils: Pupils are equal, round, and reactive to light.  Neck:     Thyroid: No thyromegaly.     Vascular: No JVD.     Trachea: No tracheal deviation.  Cardiovascular:     Rate and Rhythm: Normal rate and regular rhythm.     Heart sounds: Normal heart sounds. No murmur heard.    No friction rub. No gallop.  Pulmonary:     Effort: Pulmonary effort is normal. No respiratory distress.     Breath sounds: No wheezing or rales.  Chest:     Chest wall: No tenderness.  Abdominal:     General: Bowel sounds are normal. There is no distension.     Palpations: Abdomen is soft.     Tenderness: There is abdominal tenderness. There is no guarding or rebound.     Comments: Very mild tenderness in epigastric region on exam  Musculoskeletal:        General: Normal range of motion.     Cervical back: Normal range of motion and neck supple.  Lymphadenopathy:     Cervical: No cervical adenopathy.  Skin:    General: Skin is warm and dry.  Neurological:     Mental Status: She is alert and oriented to person, place, and time.     Cranial Nerves: No cranial nerve deficit.  Psychiatric:        Behavior: Behavior normal.        Thought Content: Thought content normal.        Judgment: Judgment normal.       Assessment/Plan: 1. Diarrhea, unspecified type Start cipro and flagyl and will update labs. Start liquid diet and advance slowly. Stay well hydrated. Do not take immodium - ciprofloxacin (CIPRO) 500 MG tablet; Take 1 tablet (500 mg total) by mouth 2 (two) times daily for 10 days.  Dispense: 14 tablet; Refill: 0 - metroNIDAZOLE (FLAGYL) 500 MG tablet; Take 1 tablet (500 mg total) by mouth 2 (two) times daily.  Dispense: 14 tablet; Refill: 0 - Comprehensive metabolic panel - Lipase - CBC w/Diff/Platelet  2. Generalized abdominal pain Start cipro and flagyl and  will update labs. Start liquid diet and advance slowly. Stay well hydrated. Do not take immodium. Advised to go to ED if any acute worsening. - ciprofloxacin (CIPRO) 500 MG tablet; Take 1 tablet (500 mg total) by mouth 2 (two) times daily for 10 days.  Dispense: 14 tablet; Refill: 0 - metroNIDAZOLE (FLAGYL) 500 MG tablet; Take 1 tablet (500 mg total) by mouth 2 (two) times daily.  Dispense: 14 tablet; Refill: 0 - Comprehensive metabolic panel - Lipase - CBC w/Diff/Platelet   General Counseling: Neoma Laming  verbalizes understanding of the findings of todays visit and agrees with plan of treatment. I have discussed any further diagnostic evaluation that may be needed or ordered today. We also reviewed her medications today. she has been encouraged to call the office with any questions or concerns that should arise related to todays visit.    Counseling:    Orders Placed This Encounter  Procedures   Comprehensive metabolic panel   Lipase   CBC w/Diff/Platelet    Meds ordered this encounter  Medications   ciprofloxacin (CIPRO) 500 MG tablet    Sig: Take 1 tablet (500 mg total) by mouth 2 (two) times daily for 10 days.    Dispense:  14 tablet    Refill:  0   metroNIDAZOLE (FLAGYL) 500 MG tablet    Sig: Take 1 tablet (500 mg total) by mouth 2 (two) times daily.    Dispense:  14 tablet    Refill:  0    Time spent:30 Minutes

## 2022-02-27 LAB — CBC WITH DIFFERENTIAL/PLATELET
Basophils Absolute: 0.1 10*3/uL (ref 0.0–0.2)
Basos: 1 %
EOS (ABSOLUTE): 0.3 10*3/uL (ref 0.0–0.4)
Eos: 3 %
Hematocrit: 40.5 % (ref 34.0–46.6)
Hemoglobin: 12.9 g/dL (ref 11.1–15.9)
Immature Grans (Abs): 0 10*3/uL (ref 0.0–0.1)
Immature Granulocytes: 0 %
Lymphocytes Absolute: 2.6 10*3/uL (ref 0.7–3.1)
Lymphs: 30 %
MCH: 28.8 pg (ref 26.6–33.0)
MCHC: 31.9 g/dL (ref 31.5–35.7)
MCV: 90 fL (ref 79–97)
Monocytes Absolute: 0.5 10*3/uL (ref 0.1–0.9)
Monocytes: 6 %
Neutrophils Absolute: 5.2 10*3/uL (ref 1.4–7.0)
Neutrophils: 60 %
Platelets: 341 10*3/uL (ref 150–450)
RBC: 4.48 x10E6/uL (ref 3.77–5.28)
RDW: 12.3 % (ref 11.7–15.4)
WBC: 8.7 10*3/uL (ref 3.4–10.8)

## 2022-02-27 LAB — COMPREHENSIVE METABOLIC PANEL
ALT: 25 IU/L (ref 0–32)
AST: 17 IU/L (ref 0–40)
Albumin/Globulin Ratio: 1.2 (ref 1.2–2.2)
Albumin: 3.7 g/dL — ABNORMAL LOW (ref 3.9–4.9)
Alkaline Phosphatase: 101 IU/L (ref 44–121)
BUN/Creatinine Ratio: 21 (ref 12–28)
BUN: 13 mg/dL (ref 8–27)
Bilirubin Total: 0.5 mg/dL (ref 0.0–1.2)
CO2: 27 mmol/L (ref 20–29)
Calcium: 9.3 mg/dL (ref 8.7–10.3)
Chloride: 102 mmol/L (ref 96–106)
Creatinine, Ser: 0.61 mg/dL (ref 0.57–1.00)
Globulin, Total: 3 g/dL (ref 1.5–4.5)
Glucose: 97 mg/dL (ref 70–99)
Potassium: 3.8 mmol/L (ref 3.5–5.2)
Sodium: 142 mmol/L (ref 134–144)
Total Protein: 6.7 g/dL (ref 6.0–8.5)
eGFR: 98 mL/min/{1.73_m2} (ref >59)

## 2022-02-27 LAB — LIPASE: Lipase: 21 U/L (ref 14–72)

## 2022-03-02 ENCOUNTER — Telehealth: Payer: Self-pay

## 2022-03-02 NOTE — Telephone Encounter (Signed)
Spoke with patient regarding lab results. 

## 2022-03-02 NOTE — Telephone Encounter (Signed)
-----   Message from Mylinda Latina, PA-C sent at 03/01/2022  4:16 PM EST ----- Please let her know all her labs look good

## 2022-03-16 DIAGNOSIS — I10 Essential (primary) hypertension: Secondary | ICD-10-CM | POA: Diagnosis not present

## 2022-03-16 DIAGNOSIS — N181 Chronic kidney disease, stage 1: Secondary | ICD-10-CM | POA: Diagnosis not present

## 2022-03-16 DIAGNOSIS — R809 Proteinuria, unspecified: Secondary | ICD-10-CM | POA: Diagnosis not present

## 2022-03-16 NOTE — Telephone Encounter (Signed)
Done

## 2022-03-22 DIAGNOSIS — Z008 Encounter for other general examination: Secondary | ICD-10-CM | POA: Diagnosis not present

## 2022-03-22 DIAGNOSIS — R809 Proteinuria, unspecified: Secondary | ICD-10-CM | POA: Diagnosis not present

## 2022-03-22 DIAGNOSIS — R7303 Prediabetes: Secondary | ICD-10-CM | POA: Diagnosis not present

## 2022-03-30 ENCOUNTER — Other Ambulatory Visit: Payer: Self-pay | Admitting: Physician Assistant

## 2022-04-12 ENCOUNTER — Ambulatory Visit (INDEPENDENT_AMBULATORY_CARE_PROVIDER_SITE_OTHER): Payer: No Typology Code available for payment source | Admitting: Physician Assistant

## 2022-04-12 ENCOUNTER — Encounter: Payer: Self-pay | Admitting: Physician Assistant

## 2022-04-12 VITALS — BP 138/82 | HR 79 | Temp 98.4°F | Resp 16 | Ht 60.0 in | Wt 192.0 lb

## 2022-04-12 DIAGNOSIS — I10 Essential (primary) hypertension: Secondary | ICD-10-CM | POA: Diagnosis not present

## 2022-04-12 DIAGNOSIS — R7303 Prediabetes: Secondary | ICD-10-CM

## 2022-04-12 MED ORDER — ACCU-CHEK GUIDE ME W/DEVICE KIT: PACK | 0 refills | Status: DC

## 2022-04-12 NOTE — Progress Notes (Signed)
[facility] [address] [place], [place] [id number]  Internal MEDICINE  Office Visit Note  Patient Name: Jamie PolesDeborah Powers  [id number]  [id number]  Date of Service: 04/21/2022  Chief Complaint  Patient presents with   Follow-up   Hypertension    HPI Pt is here for routine follow up -BP 117/72 yesterday and has been doing well -diarrhea and abdominal pain resolved -Upper thighs sometimes painful, occasionally feels some knots and can be tender. Doesn't happen all the time. Will monitor and contact office if new or worsening symptoms   Current Medication: Outpatient Encounter Medications as of 04/12/2022  Medication Sig   amLODipine (NORVASC) 2.5 MG tablet TAKE 1 TABLET(2.5 MG) BY MOUTH DAILY   Blood Glucose Monitoring Suppl (ACCU-CHEK GUIDE ME) w/Device KIT Check sugar one time per day when fasting   CALCIUM-VITAMIN D PO Take by mouth.   cetirizine (ZYRTEC) 10 MG tablet TAKE 1 TABLET(10 MG) BY MOUTH DAILY   diclofenac Sodium (VOLTAREN) 1 % GEL Apply 4 g topically 4 (four) times daily.   empagliflozin (JARDIANCE) 10 MG TABS tablet Take 1 tablet (10 mg total) by mouth daily.   ergocalciferol (DRISDOL) 1.25 MG (50000 UT) capsule Take one cap q week   FLUoxetine (PROZAC) 10 MG capsule TAKE 1 CAPSULE BY MOUTH EVERY DAY   hydrocortisone 2.5 % cream Apply 1 application topically as needed.   losartan (COZAAR) 50 MG tablet TAKE 1 TABLET(50 MG) BY MOUTH TWICE DAILY   metroNIDAZOLE (FLAGYL) 500 MG tablet Take 1 tablet (500 mg total) by mouth 2 (two) times daily.   nystatin cream (MYCOSTATIN) Apply topically 3 (three) times daily.   pantoprazole (PROTONIX) 40 MG tablet TAKE 1 TABLET(40 MG) BY MOUTH DAILY   rosuvastatin (CRESTOR) 5 MG tablet Take 1 tablet (5 mg total) by mouth daily.   VENTOLIN HFA 108 (90 Base) MCG/ACT inhaler INHALE 2 PUFFS INTO THE LUNGS EVERY 6 HOURS AS NEEDED FOR WHEEZING OR SHORTNESS OF BREATH   No facility-administered encounter medications on file as of  04/12/2022.    Surgical History: Past Surgical History:  Procedure Laterality Date   BREAST BIOPSY Left 2000   neg   BREAST SURGERY     CHOLECYSTECTOMY     COLONOSCOPY WITH PROPOFOL N/A 01/31/2015   Procedure: COLONOSCOPY WITH PROPOFOL;  Surgeon: Christena DeemMartin U Skulskie, MD;  [facility];  Service: Endoscopy;  Laterality: N/A;   TONSILLECTOMY      Medical History: Past Medical History:  Diagnosis Date   Hypertension    Obesity    Protein in urine    Sleep apnea     Family History: Family History  Problem Relation Age of Onset   Breast cancer Paternal Aunt    Hypertension Mother    Heart disease Father     Social History   Socioeconomic History   Marital status: Married    Spouse name: Not on file   Number of children: Not on file   Years of education: Not on file   Highest education level: Not on file  Occupational History   Not on file  Tobacco Use   Smoking status: Never   Smokeless tobacco: Never  Substance and Sexual Activity   Alcohol use: No   Drug use: No   Sexual activity: Not on file  Other Topics Concern   Not on file  Social History Narrative   Not on file   Social Determinants of Health   Financial Resource Strain: Low Risk  (04/16/2020)  Overall Financial Resource Strain (CARDIA)    Difficulty of Paying Living Expenses: Not hard at all  Food Insecurity: Not on file  Transportation Needs: Not on file  Physical Activity: Not on file  Stress: Not on file  Social Connections: Not on file  Intimate Partner Violence: Not on file      Review of Systems  Constitutional:  Negative for chills, diaphoresis and fatigue.  HENT:  Negative for ear pain, postnasal drip and sinus pressure.   Eyes:  Negative for photophobia, discharge, redness, itching and visual disturbance.  Respiratory:  Negative for cough, shortness of breath and wheezing.   Cardiovascular:  Negative for chest pain, palpitations and leg swelling.  Gastrointestinal:   Negative for abdominal pain, constipation, diarrhea, nausea and vomiting.  Genitourinary:  Negative for dysuria and flank pain.  Musculoskeletal:  Positive for myalgias. Negative for arthralgias, back pain, gait problem and neck pain.  Skin:  Negative for color change.  Allergic/Immunologic: Negative for environmental allergies and food allergies.  Neurological:  Negative for dizziness and headaches.  Hematological:  Does not bruise/bleed easily.  Psychiatric/Behavioral:  Negative for agitation, behavioral problems (depression) and hallucinations.     Vital Signs: BP 138/82 Comment: 139/92  Pulse 79   Temp 98.4 F (36.9 C)   Resp 16   Ht 5' (1.524 m)   Wt 192 lb (87.1 kg)   SpO2 93%   BMI 37.50 kg/m    Physical Exam Vitals and nursing note reviewed.  Constitutional:      General: She is not in acute distress.    Appearance: Normal appearance. She is well-developed. She is obese. She is not diaphoretic.  HENT:     Head: Normocephalic and atraumatic.     Mouth/Throat:     Pharynx: No oropharyngeal exudate.  Eyes:     Pupils: Pupils are equal, round, and reactive to light.  Neck:     Thyroid: No thyromegaly.     Vascular: No JVD.     Trachea: No tracheal deviation.  Cardiovascular:     Rate and Rhythm: Normal rate and regular rhythm.     Heart sounds: Normal heart sounds.  Pulmonary:     Effort: Pulmonary effort is normal.  Abdominal:     General: Bowel sounds are normal.     Palpations: Abdomen is soft.  Musculoskeletal:        General: No tenderness. Normal range of motion.     Cervical back: Normal range of motion and neck supple.  Lymphadenopathy:     Cervical: No cervical adenopathy.  Skin:    General: Skin is warm and dry.  Neurological:     Mental Status: She is alert and oriented to person, place, and time.     Cranial Nerves: No cranial nerve deficit.  Psychiatric:        Behavior: Behavior normal.        Thought Content: Thought content normal.         Judgment: Judgment normal.        Assessment/Plan: 1. Prediabetes Will send monitor to check sugar periodically and continue to work on diet and exercise - Blood Glucose Monitoring Suppl (ACCU-CHEK GUIDE ME) w/Device KIT; Check sugar one time per day when fasting  Dispense: 1 kit; Refill: 0  2. Essential hypertension Stable, continue current medications   General Counseling: Jamie PoundDeborah verbalizes understanding of the findings of todays visit and agrees with plan of treatment. I have discussed any further diagnostic evaluation that may be needed or  ordered today. We also reviewed her medications today. she has been encouraged to call the office with any questions or concerns that should arise related to todays visit.    No orders of the defined types were placed in this encounter.   Meds ordered this encounter  Medications   Blood Glucose Monitoring Suppl (ACCU-CHEK GUIDE ME) w/Device KIT    Sig: Check sugar one time per day when fasting    Dispense:  1 kit    Refill:  0    This patient was seen by Lynn Ito, PA-C in collaboration with Dr. Beverely Risen as a part of collaborative care agreement.   Total time spent:30 Minutes Time spent includes review of chart, medications, test results, and follow up plan with the patient.      Dr Lyndon Code Internal medicine

## 2022-04-13 DIAGNOSIS — H5213 Myopia, bilateral: Secondary | ICD-10-CM | POA: Diagnosis not present

## 2022-04-19 DIAGNOSIS — H43812 Vitreous degeneration, left eye: Secondary | ICD-10-CM | POA: Diagnosis not present

## 2022-04-19 DIAGNOSIS — E119 Type 2 diabetes mellitus without complications: Secondary | ICD-10-CM | POA: Diagnosis not present

## 2022-04-19 DIAGNOSIS — H5213 Myopia, bilateral: Secondary | ICD-10-CM | POA: Diagnosis not present

## 2022-04-19 DIAGNOSIS — H2513 Age-related nuclear cataract, bilateral: Secondary | ICD-10-CM | POA: Diagnosis not present

## 2022-05-03 DIAGNOSIS — H2513 Age-related nuclear cataract, bilateral: Secondary | ICD-10-CM | POA: Diagnosis not present

## 2022-06-10 ENCOUNTER — Encounter: Payer: Self-pay | Admitting: Ophthalmology

## 2022-06-11 ENCOUNTER — Encounter: Payer: Self-pay | Admitting: Ophthalmology

## 2022-06-11 NOTE — Anesthesia Preprocedure Evaluation (Addendum)
Anesthesia Evaluation  Patient identified by MRN, date of birth, ID band Patient awake    Reviewed: Allergy & Precautions, H&P , NPO status , Patient's Chart, lab work & pertinent test results  Airway Mallampati: III  TM Distance: >3 FB Neck ROM: Full    Dental no notable dental hx.    Pulmonary neg pulmonary ROS, sleep apnea    Pulmonary exam normal breath sounds clear to auscultation       Cardiovascular hypertension, Normal cardiovascular exam Rhythm:Regular Rate:Normal     Neuro/Psych  Neuromuscular disease negative neurological ROS  negative psych ROS   GI/Hepatic Neg liver ROS,GERD  ,,  Endo/Other  negative endocrine ROS    Renal/GU negative Renal ROS  negative genitourinary   Musculoskeletal negative musculoskeletal ROS (+)    Abdominal   Peds negative pediatric ROS (+)  Hematology  (+) Blood dyscrasia, anemia   Anesthesia Other Findings Hypertension Obesity GERD  Reproductive/Obstetrics negative OB ROS                              Anesthesia Physical Anesthesia Plan  ASA: 2  Anesthesia Plan: MAC   Post-op Pain Management:    Induction: Intravenous  PONV Risk Score and Plan:   Airway Management Planned: Natural Airway and Nasal Cannula  Additional Equipment:   Intra-op Plan:   Post-operative Plan:   Informed Consent: I have reviewed the patients History and Physical, chart, labs and discussed the procedure including the risks, benefits and alternatives for the proposed anesthesia with the patient or authorized representative who has indicated his/her understanding and acceptance.     Dental Advisory Given  Plan Discussed with: Anesthesiologist, CRNA and Surgeon  Anesthesia Plan Comments: (Patient consented for risks of anesthesia including but not limited to:  - adverse reactions to medications - damage to eyes, teeth, lips or other oral mucosa - nerve  damage due to positioning  - sore throat or hoarseness - Damage to heart, brain, nerves, lungs, other parts of body or loss of life  Patient voiced understanding.)         Anesthesia Quick Evaluation

## 2022-06-14 ENCOUNTER — Ambulatory Visit (INDEPENDENT_AMBULATORY_CARE_PROVIDER_SITE_OTHER): Payer: No Typology Code available for payment source | Admitting: Physician Assistant

## 2022-06-14 ENCOUNTER — Encounter: Payer: Self-pay | Admitting: Physician Assistant

## 2022-06-14 VITALS — BP 134/88 | HR 97 | Temp 98.3°F | Resp 16 | Ht 60.0 in | Wt 193.4 lb

## 2022-06-14 DIAGNOSIS — J069 Acute upper respiratory infection, unspecified: Secondary | ICD-10-CM

## 2022-06-14 MED ORDER — AZITHROMYCIN 250 MG PO TABS
ORAL_TABLET | ORAL | 0 refills | Status: AC
Start: 2022-06-14 — End: 2022-06-19

## 2022-06-14 NOTE — Progress Notes (Signed)
[facility] [address] [place], [place] [address]  Internal MEDICINE  Office Visit Note  Patient Name: Jamie Powers  [id number]  [id number]  Date of Service: 06/29/2022  Chief Complaint  Patient presents with   Acute Visit   Cough    Started last week, was coughing up phlegm last week too.   Wheezing     HPI Pt is here for a sick visit. -Starting having productive cough about a week ago. Some wheezing, no SOB -Covid test negative -No sick contacts -Has coricidin cough medicine -No fever or chills, no N/V/D  Current Medication:  Outpatient Encounter Medications as of 06/14/2022  Medication Sig   amLODipine (NORVASC) 2.5 MG tablet TAKE 1 TABLET(2.5 MG) BY MOUTH DAILY   [EXPIRED] azithromycin (ZITHROMAX) 250 MG tablet Take 2 tablets on day 1, then 1 tablet daily on days 2 through 5   Blood Glucose Monitoring Suppl (ACCU-CHEK GUIDE ME) w/Device KIT Check sugar one time per day when fasting   CALCIUM-VITAMIN D PO Take by mouth.   cetirizine (ZYRTEC) 10 MG tablet TAKE 1 TABLET(10 MG) BY MOUTH DAILY   diclofenac Sodium (VOLTAREN) 1 % GEL Apply 4 g topically 4 (four) times daily.   empagliflozin (JARDIANCE) 10 MG TABS tablet Take 1 tablet (10 mg total) by mouth daily.   ergocalciferol (DRISDOL) 1.25 MG (50000 UT) capsule Take one cap q week   FLUoxetine (PROZAC) 10 MG capsule TAKE 1 CAPSULE BY MOUTH EVERY DAY   hydrocortisone 2.5 % cream Apply 1 application topically as needed.   losartan (COZAAR) 50 MG tablet TAKE 1 TABLET(50 MG) BY MOUTH TWICE DAILY   metroNIDAZOLE (FLAGYL) 500 MG tablet Take 1 tablet (500 mg total) by mouth 2 (two) times daily.   nystatin cream (MYCOSTATIN) Apply topically 3 (three) times daily.   pantoprazole (PROTONIX) 40 MG tablet TAKE 1 TABLET(40 MG) BY MOUTH DAILY   rosuvastatin (CRESTOR) 5 MG tablet Take 1 tablet (5 mg total) by mouth daily.   VENTOLIN HFA 108 (90 Base) MCG/ACT inhaler INHALE 2 PUFFS INTO THE LUNGS EVERY 6 HOURS AS  NEEDED FOR WHEEZING OR SHORTNESS OF BREATH   No facility-administered encounter medications on file as of 06/14/2022.      Medical History: Past Medical History:  Diagnosis Date   GERD (gastroesophageal reflux disease)    Hypertension    Obesity    Pre-diabetes    Protein in urine      Vital Signs: BP 134/88 Comment: 130/90  Pulse 97   Temp 98.3 F (36.8 C)   Resp 16   Ht 5' (1.524 m)   Wt 193 lb 6.4 oz (87.7 kg)   SpO2 99%   BMI 37.77 kg/m    Review of Systems  Constitutional:  Negative for chills, fatigue and fever.  HENT:  Positive for congestion and postnasal drip. Negative for mouth sores.   Respiratory:  Positive for cough and wheezing. Negative for shortness of breath.   Cardiovascular:  Negative for chest pain.  Gastrointestinal:  Negative for diarrhea, nausea and vomiting.  Genitourinary:  Negative for flank pain.  Psychiatric/Behavioral: Negative.      Physical Exam Vitals and nursing note reviewed.  Constitutional:      Appearance: Normal appearance.  HENT:     Head: Normocephalic and atraumatic.  Cardiovascular:     Rate and Rhythm: Normal rate and regular rhythm.  Pulmonary:     Effort: Pulmonary effort is normal.     Breath sounds: Normal breath sounds.  Skin:    General: Skin is warm and dry.  Neurological:     General: No focal deficit present.     Mental Status: She is alert.  Psychiatric:        Mood and Affect: Mood normal.        Behavior: Behavior normal.       Assessment/Plan: 1. Upper respiratory tract infection, unspecified type Will go ahead and treat with zpak and may continue cough medication as needed. May use mucinex and nasal spray for congestion as needed. - azithromycin (ZITHROMAX) 250 MG tablet; Take 2 tablets on day 1, then 1 tablet daily on days 2 through 5  Dispense: 6 tablet; Refill: 0   General Counseling: Jamie Powers verbalizes understanding of the findings of todays visit and agrees with plan of treatment. I  have discussed any further diagnostic evaluation that may be needed or ordered today. We also reviewed her medications today. she has been encouraged to call the office with any questions or concerns that should arise related to todays visit.    Counseling:    No orders of the defined types were placed in this encounter.   Meds ordered this encounter  Medications   azithromycin (ZITHROMAX) 250 MG tablet    Sig: Take 2 tablets on day 1, then 1 tablet daily on days 2 through 5    Dispense:  6 tablet    Refill:  0    Time spent:25 Minutes

## 2022-06-14 NOTE — Discharge Instructions (Signed)

## 2022-06-30 ENCOUNTER — Ambulatory Visit: Payer: No Typology Code available for payment source | Admitting: Anesthesiology

## 2022-06-30 ENCOUNTER — Ambulatory Visit
Admission: RE | Admit: 2022-06-30 | Discharge: 2022-06-30 | Disposition: A | Discharge status: Home / Self Care | Payer: No Typology Code available for payment source | Attending: Ophthalmology | Admitting: Ophthalmology

## 2022-06-30 ENCOUNTER — Encounter
Admission: RE | Disposition: A | Discharge status: Home / Self Care | Payer: Self-pay | Source: Home / Self Care | Attending: Ophthalmology

## 2022-06-30 ENCOUNTER — Encounter: Payer: Self-pay | Admitting: Ophthalmology

## 2022-06-30 ENCOUNTER — Other Ambulatory Visit: Payer: Self-pay

## 2022-06-30 DIAGNOSIS — E669 Obesity, unspecified: Secondary | ICD-10-CM | POA: Insufficient documentation

## 2022-06-30 DIAGNOSIS — H269 Unspecified cataract: Secondary | ICD-10-CM | POA: Diagnosis not present

## 2022-06-30 DIAGNOSIS — H2512 Age-related nuclear cataract, left eye: Secondary | ICD-10-CM | POA: Diagnosis not present

## 2022-06-30 DIAGNOSIS — I10 Essential (primary) hypertension: Secondary | ICD-10-CM | POA: Diagnosis not present

## 2022-06-30 DIAGNOSIS — K219 Gastro-esophageal reflux disease without esophagitis: Secondary | ICD-10-CM | POA: Diagnosis not present

## 2022-06-30 DIAGNOSIS — Z6838 Body mass index (BMI) 38.0-38.9, adult: Secondary | ICD-10-CM | POA: Diagnosis not present

## 2022-06-30 HISTORY — PX: CATARACT EXTRACTION W/PHACO: SHX586

## 2022-06-30 HISTORY — DX: Gastro-esophageal reflux disease without esophagitis: K21.9

## 2022-06-30 HISTORY — DX: Prediabetes: R73.03

## 2022-06-30 LAB — GLUCOSE, CAPILLARY: Glucose-Capillary: 122 mg/dL — ABNORMAL HIGH (ref 70–99)

## 2022-06-30 SURGERY — PHACOEMULSIFICATION, CATARACT, WITH IOL INSERTION
Anesthesia: Monitor Anesthesia Care | Site: Eye | Laterality: Left

## 2022-06-30 MED ORDER — MIDAZOLAM HCL 2 MG/2ML IJ SOLN
INTRAMUSCULAR | Status: DC | PRN
  Administered 2022-06-30 (×2): 1 mg via INTRAVENOUS

## 2022-06-30 MED ORDER — TETRACAINE HCL 0.5 % OP SOLN
1.0000 [drp] | OPHTHALMIC | Status: DC | PRN
  Administered 2022-06-30 (×2): 1 [drp] via OPHTHALMIC

## 2022-06-30 MED ORDER — SIGHTPATH DOSE#1 BSS IO SOLN
INTRAOCULAR | Status: DC | PRN
  Administered 2022-06-30: 15 mL via INTRAOCULAR

## 2022-06-30 MED ORDER — SIGHTPATH DOSE#1 BSS IO SOLN
INTRAOCULAR | Status: DC | PRN
  Administered 2022-06-30: 2 mL

## 2022-06-30 MED ORDER — BRIMONIDINE TARTRATE-TIMOLOL 0.2-0.5 % OP SOLN
OPHTHALMIC | Status: DC | PRN
  Administered 2022-06-30: 1 [drp] via OPHTHALMIC

## 2022-06-30 MED ORDER — FENTANYL CITRATE (PF) 100 MCG/2ML IJ SOLN
INTRAMUSCULAR | Status: DC | PRN
  Administered 2022-06-30: 50 ug via INTRAVENOUS

## 2022-06-30 MED ORDER — ARMC OPHTHALMIC DILATING DROPS
1.0000 | OPHTHALMIC | Status: DC | PRN
  Administered 2022-06-30 (×3): 1 via OPHTHALMIC

## 2022-06-30 MED ORDER — MOXIFLOXACIN HCL 0.5 % OP SOLN
OPHTHALMIC | Status: DC | PRN
  Administered 2022-06-30: .2 mL via OPHTHALMIC

## 2022-06-30 MED ORDER — LACTATED RINGERS IV SOLN: INTRAVENOUS | Status: DC

## 2022-06-30 MED ORDER — SIGHTPATH DOSE#1 NA HYALUR & NA CHOND-NA HYALUR IO KIT
PACK | INTRAOCULAR | Status: DC | PRN
  Administered 2022-06-30: 1 via OPHTHALMIC

## 2022-06-30 MED ORDER — SIGHTPATH DOSE#1 BSS IO SOLN
INTRAOCULAR | Status: DC | PRN
  Administered 2022-06-30: 59 mL via OPHTHALMIC

## 2022-06-30 SURGICAL SUPPLY — 9 items
CATARACT SUITE SIGHTPATH (MISCELLANEOUS) ×1 IMPLANT
FEE CATARACT SUITE SIGHTPATH (MISCELLANEOUS) ×1 IMPLANT
GLOVE SRG 8 PF TXTR STRL LF DI (GLOVE) ×1 IMPLANT
GLOVE SURG ENC TEXT LTX SZ7.5 (GLOVE) ×1 IMPLANT
GLOVE SURG UNDER POLY LF SZ8 (GLOVE) ×1
LENS IOL TECNIS EYHANCE 21.5 (Intraocular Lens) IMPLANT
NDL FILTER BLUNT 18X1 1/2 (NEEDLE) ×1 IMPLANT
NEEDLE FILTER BLUNT 18X1 1/2 (NEEDLE) ×1 IMPLANT
SYR 3ML LL SCALE MARK (SYRINGE) ×1 IMPLANT

## 2022-06-30 NOTE — H&P (Signed)
[facility]   Primary Care Physician:  Lyndon Code, MD Ophthalmologist: Dr. Lockie Mola  Pre-Procedure History & Physical: HPI:  Jamie Powers is a 67 y.o. female here for ophthalmic surgery.   Past Medical History:  Diagnosis Date   GERD (gastroesophageal reflux disease)    Hypertension    Obesity    Pre-diabetes    Protein in urine     Past Surgical History:  Procedure Laterality Date   BREAST BIOPSY Left 2000   neg   BREAST SURGERY     CHOLECYSTECTOMY     COLONOSCOPY WITH PROPOFOL N/A 01/31/2015   Procedure: COLONOSCOPY WITH PROPOFOL;  Surgeon: Christena Deem, MD;  Location: [facility] ENDOSCOPY;  Service: Endoscopy;  Laterality: N/A;   TONSILLECTOMY      Prior to Admission medications   Medication Sig Start Date End Date Taking? Authorizing Provider  amLODipine (NORVASC) 2.5 MG tablet TAKE 1 TABLET(2.5 MG) BY MOUTH DAILY 01/11/22  Yes McDonough, Lauren K, PA-C  CALCIUM-VITAMIN D PO Take by mouth.   Yes [provider]  cetirizine (ZYRTEC) 10 MG tablet TAKE 1 TABLET(10 MG) BY MOUTH DAILY 01/11/22  Yes McDonough, Lauren K, PA-C  diclofenac Sodium (VOLTAREN) 1 % GEL Apply 4 g topically 4 (four) times daily. 06/07/19  Yes Boscia, Heather E, NP  empagliflozin (JARDIANCE) 10 MG TABS tablet Take 1 tablet (10 mg total) by mouth daily. 01/11/22  Yes McDonough, Salomon Fick, PA-C  ergocalciferol (DRISDOL) 1.25 MG (50000 UT) capsule Take one cap q week 01/11/22  Yes McDonough, Lauren K, PA-C  FLUoxetine (PROZAC) 10 MG capsule TAKE 1 CAPSULE BY MOUTH EVERY DAY 03/30/22  Yes McDonough, Lauren K, PA-C  hydrocortisone 2.5 % cream Apply 1 application topically as needed.   Yes [provider]  losartan (COZAAR) 50 MG tablet TAKE 1 TABLET(50 MG) BY MOUTH TWICE DAILY 01/11/22  Yes McDonough, Lauren K, PA-C  pantoprazole (PROTONIX) 40 MG tablet TAKE 1 TABLET(40 MG) BY MOUTH DAILY 02/08/22  Yes McDonough, Lauren K, PA-C  rosuvastatin (CRESTOR) 5 MG tablet Take 1 tablet (5 mg  total) by mouth daily. 01/11/22  Yes McDonough, Lauren K, PA-C  VENTOLIN HFA 108 (90 Base) MCG/ACT inhaler INHALE 2 PUFFS INTO THE LUNGS EVERY 6 HOURS AS NEEDED FOR WHEEZING OR SHORTNESS OF BREATH 07/18/20  Yes Lyndon Code, MD  Blood Glucose Monitoring Suppl (ACCU-CHEK GUIDE ME) w/Device KIT Check sugar one time per day when fasting 04/12/22   McDonough, Lauren K, PA-C  metroNIDAZOLE (FLAGYL) 500 MG tablet Take 1 tablet (500 mg total) by mouth 2 (two) times daily. 02/26/22   McDonough, Salomon Fick, PA-C  nystatin cream (MYCOSTATIN) Apply topically 3 (three) times daily. 05/22/18   Carlean Jews, NP    Allergies as of 04/21/2022 - Review Complete 04/12/2022  Allergen Reaction Noted   Penicillins  01/30/2015    Family History  Problem Relation Age of Onset   Breast cancer Paternal Aunt    Hypertension Mother    Heart disease Father     Social History   Socioeconomic History   Marital status: Married    Spouse name: Not on file   Number of children: Not on file   Years of education: Not on file   Highest education level: Not on file  Occupational History   Not on file  Tobacco Use   Smoking status: Never   Smokeless tobacco: Never  Substance and Sexual Activity   Alcohol use: No   Drug use: No  Sexual activity: Not on file  Other Topics Concern   Not on file  Social History Narrative   Not on file   Social Determinants of Health   Financial Resource Strain: Low Risk  (04/16/2020)   Overall Financial Resource Strain (CARDIA)    Difficulty of Paying Living Expenses: Not hard at all  Food Insecurity: Not on file  Transportation Needs: Not on file  Physical Activity: Not on file  Stress: Not on file  Social Connections: Not on file  Intimate Partner Violence: Not on file    Review of Systems: See HPI, otherwise negative ROS  Physical Exam: BP (!) 134/100   Pulse 85   Temp 97.7 F (36.5 C) (Temporal)   Resp 15   Ht 5' (1.524 m)   Wt 88.5 kg   SpO2 97%   BMI 38.10  kg/m  General:   Alert,  pleasant and cooperative in NAD Head:  Normocephalic and atraumatic. Lungs:  Clear to auscultation.    Heart:  Regular rate and rhythm.   Impression/Plan: Jamie Powers is here for ophthalmic surgery.  Risks, benefits, limitations, and alternatives regarding ophthalmic surgery have been reviewed with the patient.  Questions have been answered.  All parties agreeable.   Lockie Mola, MD  06/30/2022, 7:33 AM

## 2022-06-30 NOTE — Op Note (Signed)
OPERATIVE NOTE  Jamie Powers [id number] 06/30/2022   PREOPERATIVE DIAGNOSIS:  Nuclear sclerotic cataract left eye. H25.12   POSTOPERATIVE DIAGNOSIS:    Nuclear sclerotic cataract left eye.     PROCEDURE:  Phacoemusification with posterior chamber intraocular lens placement of the left eye  Ultrasound time: Procedure(s): CATARACT EXTRACTION PHACO AND INTRAOCULAR LENS PLACEMENT (IOC) LEFT DIABETIC 10.40 01:05.1 (Left)  LENS:   Implant Name Type Inv. Item Serial No. Manufacturer Lot No. LRB No. Used Action  LENS IOL TECNIS EYHANCE 21.5 - [id number] Intraocular Lens LENS IOL TECNIS EYHANCE 21.5 [id number] [employer]  Left 1 Implanted      SURGEON:  Deirdre Evener, MD   ANESTHESIA:  Topical with tetracaine drops and 2% Xylocaine jelly, augmented with 1% preservative-free intracameral lidocaine.    COMPLICATIONS:  None.   DESCRIPTION OF PROCEDURE:  The patient was identified in the holding room and transported to the operating room and placed in the supine position under the operating microscope.  The left eye was identified as the operative eye and it was prepped and draped in the usual sterile ophthalmic fashion.   A 1 millimeter clear-corneal paracentesis was made at the 1:30 position.  0.5 ml of preservative-free 1% lidocaine was injected into the anterior chamber.  The anterior chamber was filled with Viscoat viscoelastic.  A 2.4 millimeter keratome was used to make a near-clear corneal incision at the 10:30 position.  .  A curvilinear capsulorrhexis was made with a cystotome and capsulorrhexis forceps.  Balanced salt solution was used to hydrodissect and hydrodelineate the nucleus.   Phacoemulsification was then used in stop and chop fashion to remove the lens nucleus and epinucleus.  The remaining cortex was then removed using the irrigation and aspiration handpiece. Provisc was then placed into the capsular bag to distend it for lens placement.  A lens was then injected into  the capsular bag.  The remaining viscoelastic was aspirated.   Wounds were hydrated with balanced salt solution.  The anterior chamber was inflated to a physiologic pressure with balanced salt solution.  No wound leaks were noted. Vigamox 0.2 ml of a 1mg  per ml solution was injected into the anterior chamber for a dose of 0.2 mg of intracameral antibiotic at the completion of the case.   Timolol and Brimonidine drops were applied to the eye.  The patient was taken to the recovery room in stable condition without complications of anesthesia or surgery.  Jamie Powers 06/30/2022, 7:55 AM

## 2022-06-30 NOTE — Anesthesia Postprocedure Evaluation (Signed)
Anesthesia Post Note  Patient: Jamie Powers  Procedure(s) Performed: CATARACT EXTRACTION PHACO AND INTRAOCULAR LENS PLACEMENT (IOC) LEFT DIABETIC 10.40 01:05.1 (Left: Eye)  Patient location during evaluation: PACU Anesthesia Type: MAC Level of consciousness: awake and alert Pain management: pain level controlled Vital Signs Assessment: post-procedure vital signs reviewed and stable Respiratory status: spontaneous breathing, nonlabored ventilation, respiratory function stable and patient connected to nasal cannula oxygen Cardiovascular status: stable and blood pressure returned to baseline Postop Assessment: no apparent nausea or vomiting Anesthetic complications: no   No notable events documented.   Last Vitals:  Vitals:   06/30/22 0756 06/30/22 0802  BP: 122/77 119/80  Pulse: 74 83  Resp: 18 20  Temp: (!) 36.4 C (!) 36.4 C  SpO2: 94% 95%    Last Pain:  Vitals:   06/30/22 0802  TempSrc:   PainSc: 0-No pain                 Marylouise Mallet C Fawne Hughley

## 2022-06-30 NOTE — Transfer of Care (Signed)
Immediate Anesthesia Transfer of Care Note  Patient: Jamie Powers  Procedure(s) Performed: CATARACT EXTRACTION PHACO AND INTRAOCULAR LENS PLACEMENT (IOC) LEFT DIABETIC 10.40 01:05.1 (Left: Eye)  Patient Location: PACU  Anesthesia Type: MAC  Level of Consciousness: awake, alert  and patient cooperative  Airway and Oxygen Therapy: Patient Spontanous Breathing and Patient connected to supplemental oxygen  Post-op Assessment: Post-op Vital signs reviewed, Patient's Cardiovascular Status Stable, Respiratory Function Stable, Patent Airway and No signs of Nausea or vomiting  Post-op Vital Signs: Reviewed and stable  Complications: No notable events documented.

## 2022-07-01 ENCOUNTER — Encounter: Payer: Self-pay | Admitting: Ophthalmology

## 2022-07-05 ENCOUNTER — Encounter: Payer: Self-pay | Admitting: Ophthalmology

## 2022-07-05 NOTE — Anesthesia Preprocedure Evaluation (Addendum)
Anesthesia Evaluation  Patient identified by MRN, date of birth, ID band Patient awake    Reviewed: Allergy & Precautions, H&P , NPO status , Patient's Chart, lab work & pertinent test results  Airway Mallampati: III  TM Distance: >3 FB Neck ROM: Full    Dental no notable dental hx.    Pulmonary neg pulmonary ROS   Pulmonary exam normal breath sounds clear to auscultation       Cardiovascular hypertension, Normal cardiovascular exam Rhythm:Regular Rate:Normal     Neuro/Psych negative neurological ROS  negative psych ROS   GI/Hepatic Neg liver ROS,GERD  ,,  Endo/Other  negative endocrine ROS    Renal/GU negative Renal ROS  negative genitourinary   Musculoskeletal negative musculoskeletal ROS (+)    Abdominal   Peds negative pediatric ROS (+)  Hematology  (+) Blood dyscrasia, anemia   Anesthesia Other Findings Hypertension  Obesity Protein in urine  Pre-diabetes GERD (gastroesophageal reflux disease)   Previous cataract surgery 06-30-22  Reproductive/Obstetrics negative OB ROS                             Anesthesia Physical Anesthesia Plan  ASA: 2  Anesthesia Plan: MAC   Post-op Pain Management:    Induction: Intravenous  PONV Risk Score and Plan:   Airway Management Planned: Natural Airway and Nasal Cannula  Additional Equipment:   Intra-op Plan:   Post-operative Plan:   Informed Consent: I have reviewed the patients History and Physical, chart, labs and discussed the procedure including the risks, benefits and alternatives for the proposed anesthesia with the patient or authorized representative who has indicated his/her understanding and acceptance.     Dental Advisory Given  Plan Discussed with: Anesthesiologist, CRNA and Surgeon  Anesthesia Plan Comments: (Patient consented for risks of anesthesia including but not limited to:  - adverse reactions to  medications - damage to eyes, teeth, lips or other oral mucosa - nerve damage due to positioning  - sore throat or hoarseness - Damage to heart, brain, nerves, lungs, other parts of body or loss of life  Patient voiced understanding.)        Anesthesia Quick Evaluation

## 2022-07-06 DIAGNOSIS — H2511 Age-related nuclear cataract, right eye: Secondary | ICD-10-CM | POA: Diagnosis not present

## 2022-07-09 ENCOUNTER — Other Ambulatory Visit: Payer: Self-pay | Admitting: Physician Assistant

## 2022-07-09 DIAGNOSIS — I10 Essential (primary) hypertension: Secondary | ICD-10-CM

## 2022-07-12 ENCOUNTER — Other Ambulatory Visit: Payer: Self-pay

## 2022-07-12 ENCOUNTER — Encounter: Payer: Self-pay | Admitting: Physician Assistant

## 2022-07-12 ENCOUNTER — Ambulatory Visit (INDEPENDENT_AMBULATORY_CARE_PROVIDER_SITE_OTHER): Payer: No Typology Code available for payment source | Admitting: Physician Assistant

## 2022-07-12 VITALS — BP 118/80 | HR 81 | Temp 98.3°F | Resp 16 | Ht 60.0 in | Wt 192.8 lb

## 2022-07-12 DIAGNOSIS — E559 Vitamin D deficiency, unspecified: Secondary | ICD-10-CM

## 2022-07-12 DIAGNOSIS — R5383 Other fatigue: Secondary | ICD-10-CM

## 2022-07-12 DIAGNOSIS — Z1231 Encounter for screening mammogram for malignant neoplasm of breast: Secondary | ICD-10-CM

## 2022-07-12 DIAGNOSIS — Z01419 Encounter for gynecological examination (general) (routine) without abnormal findings: Secondary | ICD-10-CM | POA: Diagnosis not present

## 2022-07-12 DIAGNOSIS — E782 Mixed hyperlipidemia: Secondary | ICD-10-CM | POA: Diagnosis not present

## 2022-07-12 DIAGNOSIS — R300 Dysuria: Secondary | ICD-10-CM

## 2022-07-12 DIAGNOSIS — Z0001 Encounter for general adult medical examination with abnormal findings: Secondary | ICD-10-CM

## 2022-07-12 DIAGNOSIS — R946 Abnormal results of thyroid function studies: Secondary | ICD-10-CM

## 2022-07-12 DIAGNOSIS — R7303 Prediabetes: Secondary | ICD-10-CM | POA: Diagnosis not present

## 2022-07-12 DIAGNOSIS — I10 Essential (primary) hypertension: Secondary | ICD-10-CM

## 2022-07-12 DIAGNOSIS — E538 Deficiency of other specified B group vitamins: Secondary | ICD-10-CM | POA: Diagnosis not present

## 2022-07-12 MED ORDER — ONETOUCH VERIO W/DEVICE KIT
PACK | 0 refills | Status: AC
Start: 2022-07-12 — End: ?

## 2022-07-12 MED ORDER — LOSARTAN POTASSIUM 50 MG PO TABS
ORAL_TABLET | ORAL | 1 refills | Status: DC
Start: 2022-07-12 — End: 2022-08-12

## 2022-07-12 NOTE — Progress Notes (Signed)
[facility] [address] [place], [place] [address]  Internal MEDICINE  Office Visit Note  Patient Name: Jamie Powers  [id number]  [id number]  Date of Service: 07/12/2022  Chief Complaint  Patient presents with   Medicare Wellness   Gastroesophageal Reflux   Hypertension    HPI Jamie Powers presents for an annual well visit and physical exam.  Well-appearing 67 y.o. female presenting with her daughter in office today Routine CRC screening: UTD due in 2027 Routine mammogram: ordered Eye exam: done recently with cataract surgery scheduled, goes Wednesday for right eye Labs: ordered -BP stable at home, 122 systolic before appt today -Did take awhile for cough to subside but has resolved now -does state insurance prefers onetouch over accu check monitor and needs this sent     07/12/2022   11:11 AM 07/06/2021   10:57 AM 06/23/2020    3:19 PM  MMSE - Mini Mental State Exam  Orientation to time 5 5 5   Orientation to Place 5 5 5   Registration 3 3 3   Attention/ Calculation 5 5 5   Recall 3 3 3   Language- name 2 objects 2 2 2   Language- repeat 1 1 1   Language- follow 3 step command 3 3 3   Language- read & follow direction 1 1 1   Write a sentence 1 1 1   Copy design 1 1 1   Total score 30 30 30     Functional Status Survey: Is the patient deaf or have difficulty hearing?: No Does the patient have difficulty seeing, even when wearing glasses/contacts?: No Does the patient have difficulty concentrating, remembering, or making decisions?: No Does the patient have difficulty walking or climbing stairs?: No Does the patient have difficulty dressing or bathing?: No Does the patient have difficulty doing errands alone such as visiting a doctor's office or shopping?: No     10/05/2021   11:44 AM 01/11/2022    1:26 PM 02/26/2022    9:30 AM 04/12/2022   11:26 AM 07/12/2022   11:11 AM  Fall Risk  Falls in the past year? 0 0 0 0 0  Was there an injury with Fall?   0    Fall Risk  Category Calculator   0    (RETIRED) Patient Fall Risk Level Low fall risk Low fall risk     Patient at Risk for Falls Due to No Fall Risks No Fall Risks No Fall Risks    Fall risk Follow up Falls evaluation completed Falls evaluation completed Falls evaluation completed         04/12/2022   11:26 AM  Depression screen PHQ 2/9  Decreased Interest 0  Down, Depressed, Hopeless 0  PHQ - 2 Score 0        No data to display            Current Medication: Outpatient Encounter Medications as of 07/12/2022  Medication Sig   amLODipine (NORVASC) 2.5 MG tablet TAKE ONE TABLET BY MOUTH DAILY AT 9AM   Blood Glucose Monitoring Suppl (ONETOUCH VERIO) w/Device KIT Use as directed. E11.9   CALCIUM-VITAMIN D PO Take by mouth.   cetirizine (ZYRTEC) 10 MG tablet TAKE 1 TABLET(10 MG) BY MOUTH DAILY   diclofenac Sodium (VOLTAREN) 1 % GEL Apply 4 g topically 4 (four) times daily.   empagliflozin (JARDIANCE) 10 MG TABS tablet Take 1 tablet (10 mg total) by mouth daily.   ergocalciferol (DRISDOL) 1.25 MG (50000 UT) capsule Take one cap q week   FLUoxetine (PROZAC) 10 MG  capsule TAKE 1 CAPSULE BY MOUTH EVERY DAY   hydrocortisone 2.5 % cream Apply 1 application topically as needed.   losartan (COZAAR) 50 MG tablet TAKE 1 TABLET(50 MG) BY MOUTH TWICE DAILY   metroNIDAZOLE (FLAGYL) 500 MG tablet Take 1 tablet (500 mg total) by mouth 2 (two) times daily.   nystatin cream (MYCOSTATIN) Apply topically 3 (three) times daily.   pantoprazole (PROTONIX) 40 MG tablet TAKE 1 TABLET(40 MG) BY MOUTH DAILY   rosuvastatin (CRESTOR) 5 MG tablet Take 1 tablet (5 mg total) by mouth daily.   VENTOLIN HFA 108 (90 Base) MCG/ACT inhaler INHALE 2 PUFFS INTO THE LUNGS EVERY 6 HOURS AS NEEDED FOR WHEEZING OR SHORTNESS OF BREATH   [DISCONTINUED] Blood Glucose Monitoring Suppl (ACCU-CHEK GUIDE ME) w/Device KIT Check sugar one time per day when fasting   No facility-administered encounter medications on file as of 07/12/2022.     Surgical History: Past Surgical History:  Procedure Laterality Date   BREAST BIOPSY Left 2000   neg   BREAST SURGERY     CATARACT EXTRACTION W/PHACO Left 06/30/2022   Procedure: CATARACT EXTRACTION PHACO AND INTRAOCULAR LENS PLACEMENT (IOC) LEFT DIABETIC 10.40 01:05.1;  Surgeon: Lockie Mola, MD;  Location: [facility];  Service: Ophthalmology;  Laterality: Left;   CHOLECYSTECTOMY     COLONOSCOPY WITH PROPOFOL N/A 01/31/2015   Procedure: COLONOSCOPY WITH PROPOFOL;  Surgeon: Christena Deem, MD;  Location: [facility] END[facility]COPY;  Service: Endoscopy;  Laterality: N/A;   TONSILLECTOMY      Medical History: Past Medical History:  Diagnosis Date   GERD (gastroesophageal reflux disease)    Hypertension    Obesity    Pre-diabetes    Protein in urine     Family History: Family History  Problem Relation Age of Onset   Breast cancer Paternal Aunt    Hypertension Mother    Heart disease Father     Social History   Socioeconomic History   Marital status: Married    Spouse name: Not on file   Number of children: Not on file   Years of education: Not on file   Highest education level: Not on file  Occupational History   Not on file  Tobacco Use   Smoking status: Never   Smokeless tobacco: Never  Substance and Sexual Activity   Alcohol use: No   Drug use: No   Sexual activity: Not on file  Other Topics Concern   Not on file  Social History Narrative   Not on file   Social Determinants of Health   Financial Resource Strain: Low Risk  (04/16/2020)   Overall Financial Resource Strain (CARDIA)    Difficulty of Paying Living Expenses: Not hard at all  Food Insecurity: Not on file  Transportation Needs: Not on file  Physical Activity: Not on file  Stress: Not on file  Social Connections: Not on file  Intimate Partner Violence: Not on file      Review of Systems  Constitutional:  Negative for chills, diaphoresis and fatigue.  HENT:  Negative for ear  pain, postnasal drip and sinus pressure.   Eyes:  Negative for photophobia, discharge, redness, itching and visual disturbance.  Respiratory:  Negative for cough, shortness of breath and wheezing.   Cardiovascular:  Negative for chest pain, palpitations and leg swelling.  Gastrointestinal:  Negative for abdominal pain, constipation, diarrhea, nausea and vomiting.  Genitourinary:  Negative for dysuria and flank pain.  Musculoskeletal:  Positive for myalgias. Negative for arthralgias, back pain, gait  problem and neck pain.  Skin:  Negative for color change.  Allergic/Immunologic: Negative for environmental allergies and food allergies.  Neurological:  Negative for dizziness and headaches.  Hematological:  Does not bruise/bleed easily.  Psychiatric/Behavioral:  Negative for agitation, behavioral problems (depression) and hallucinations.     Vital Signs: BP 118/80 Comment: 130/91  Pulse 81   Temp 98.3 F (36.8 C)   Resp 16   Ht 5' (1.524 m)   Wt 192 lb 12.8 oz (87.5 kg)   SpO2 96%   BMI 37.65 kg/m    Physical Exam Vitals and nursing note reviewed.  Constitutional:      General: She is not in acute distress.    Appearance: Normal appearance. She is well-developed. She is obese. She is not diaphoretic.  HENT:     Head: Normocephalic and atraumatic.     Mouth/Throat:     Pharynx: No oropharyngeal exudate.  Eyes:     Pupils: Pupils are equal, round, and reactive to light.  Neck:     Thyroid: No thyromegaly.     Vascular: No JVD.     Trachea: No tracheal deviation.  Cardiovascular:     Rate and Rhythm: Normal rate and regular rhythm.     Heart sounds: Normal heart sounds.  Pulmonary:     Effort: Pulmonary effort is normal.  Chest:     Chest wall: No tenderness.  Breasts:    Right: Normal. No mass.     Left: Normal. No mass.  Abdominal:     General: Bowel sounds are normal.     Palpations: Abdomen is soft.     Tenderness: There is no abdominal tenderness.   Musculoskeletal:        General: No tenderness. Normal range of motion.     Cervical back: Normal range of motion and neck supple.  Lymphadenopathy:     Cervical: No cervical adenopathy.  Skin:    General: Skin is warm and dry.  Neurological:     Mental Status: She is alert and oriented to person, place, and time.     Cranial Nerves: No cranial nerve deficit.  Psychiatric:        Behavior: Behavior normal.        Thought Content: Thought content normal.        Judgment: Judgment normal.        Assessment/Plan: 1. Encounter for general adult medical examination with abnormal findings CPE performed, labs ordered  2. Essential hypertension Stable, Continue current medication  3. Prediabetes - Hgb A1C w/o eAG - Blood Glucose Monitoring Suppl (ONETOUCH VERIO) w/Device KIT; Use as directed. E11.9  Dispense: 1 kit; Refill: 0  4. Visit for screening mammogram - MM 3D SCREENING MAMMOGRAM BILATERAL BREAST; Future  5. Vitamin D deficiency - VITAMIN D 25 Hydroxy (Vit-D Deficiency, Fractures)  6. B12 deficiency - B12 and Folate Panel  7. Mixed hyperlipidemia - Lipid Panel With LDL/HDL Ratio  8. Visit for gynecologic examination Breast exam performed  9. Abnormal thyroid exam - TSH + free T4  10. Other fatigue - CBC w/Diff/Platelet - Comprehensive metabolic panel - TSH + free T4 - Lipid Panel With LDL/HDL Ratio - B12 and Folate Panel - VITAMIN D 25 Hydroxy (Vit-D Deficiency, Fractures) - Hgb A1C w/o eAG  11. Dysuria - UA/M w/rflx Culture, Routine     General Counseling: Terre verbalizes understanding of the findings of todays visit and agrees with plan of treatment. I have discussed any further diagnostic evaluation that may be  needed or ordered today. We also reviewed her medications today. she has been encouraged to call the office with any questions or concerns that should arise related to todays visit.    Orders Placed This Encounter  Procedures   MM 3D  SCREENING MAMMOGRAM BILATERAL BREAST   UA/M w/rflx Culture, Routine   CBC w/Diff/Platelet   Comprehensive metabolic panel   TSH + free T4   Lipid Panel With LDL/HDL Ratio   B12 and Folate Panel   VITAMIN D 25 Hydroxy (Vit-D Deficiency, Fractures)   Hgb A1C w/o eAG    Meds ordered this encounter  Medications   Blood Glucose Monitoring Suppl (ONETOUCH VERIO) w/Device KIT    Sig: Use as directed. E11.9    Dispense:  1 kit    Refill:  0    Return in about 3 months (around 10/12/2022) for general follow up.   Total time spent:35 Minutes Time spent includes review of chart, medications, test results, and follow up plan with the patient.   [place] Controlled Substance Database was reviewed by me.  This patient was seen by Lynn Ito, PA-C in collaboration with Dr. Beverely Risen as a part of collaborative care agreement.  Lynn Ito, PA-C Internal medicine

## 2022-07-12 NOTE — Discharge Instructions (Signed)

## 2022-07-13 LAB — MICROSCOPIC EXAMINATION
Bacteria, UA: NONE SEEN
Casts: NONE SEEN /lpf
RBC, Urine: NONE SEEN /hpf (ref 0–2)

## 2022-07-13 LAB — UA/M W/RFLX CULTURE, ROUTINE
Bilirubin, UA: NEGATIVE
Ketones, UA: NEGATIVE
Leukocytes,UA: NEGATIVE
Nitrite, UA: NEGATIVE
RBC, UA: NEGATIVE
Specific Gravity, UA: 1.026 (ref 1.005–1.030)
Urobilinogen, Ur: 0.2 mg/dL (ref 0.2–1.0)
pH, UA: 5.5 (ref 5.0–7.5)

## 2022-07-14 ENCOUNTER — Ambulatory Visit: Payer: No Typology Code available for payment source | Admitting: Anesthesiology

## 2022-07-14 ENCOUNTER — Ambulatory Visit
Admission: RE | Admit: 2022-07-14 | Discharge: 2022-07-14 | Disposition: A | Discharge status: Home / Self Care | Payer: No Typology Code available for payment source | Attending: Ophthalmology | Admitting: Ophthalmology

## 2022-07-14 ENCOUNTER — Other Ambulatory Visit: Payer: Self-pay

## 2022-07-14 ENCOUNTER — Encounter
Admission: RE | Disposition: A | Discharge status: Home / Self Care | Payer: Self-pay | Source: Home / Self Care | Attending: Ophthalmology

## 2022-07-14 DIAGNOSIS — Z7984 Long term (current) use of oral hypoglycemic drugs: Secondary | ICD-10-CM | POA: Insufficient documentation

## 2022-07-14 DIAGNOSIS — K219 Gastro-esophageal reflux disease without esophagitis: Secondary | ICD-10-CM | POA: Insufficient documentation

## 2022-07-14 DIAGNOSIS — H2511 Age-related nuclear cataract, right eye: Secondary | ICD-10-CM | POA: Diagnosis not present

## 2022-07-14 DIAGNOSIS — Z6838 Body mass index (BMI) 38.0-38.9, adult: Secondary | ICD-10-CM | POA: Diagnosis not present

## 2022-07-14 DIAGNOSIS — E669 Obesity, unspecified: Secondary | ICD-10-CM | POA: Insufficient documentation

## 2022-07-14 DIAGNOSIS — Z9842 Cataract extraction status, left eye: Secondary | ICD-10-CM | POA: Insufficient documentation

## 2022-07-14 DIAGNOSIS — R7303 Prediabetes: Secondary | ICD-10-CM | POA: Insufficient documentation

## 2022-07-14 DIAGNOSIS — I10 Essential (primary) hypertension: Secondary | ICD-10-CM | POA: Insufficient documentation

## 2022-07-14 DIAGNOSIS — Z961 Presence of intraocular lens: Secondary | ICD-10-CM | POA: Insufficient documentation

## 2022-07-14 HISTORY — PX: CATARACT EXTRACTION W/PHACO: SHX586

## 2022-07-14 LAB — GLUCOSE, CAPILLARY: Glucose-Capillary: 91 mg/dL (ref 70–99)

## 2022-07-14 SURGERY — PHACOEMULSIFICATION, CATARACT, WITH IOL INSERTION
Anesthesia: Monitor Anesthesia Care | Site: Eye | Laterality: Right

## 2022-07-14 MED ORDER — SIGHTPATH DOSE#1 BSS IO SOLN
INTRAOCULAR | Status: DC | PRN
  Administered 2022-07-14: 81 mL via OPHTHALMIC

## 2022-07-14 MED ORDER — TETRACAINE HCL 0.5 % OP SOLN
1.0000 [drp] | OPHTHALMIC | Status: DC | PRN
  Administered 2022-07-14 (×3): 1 [drp] via OPHTHALMIC

## 2022-07-14 MED ORDER — ARMC OPHTHALMIC DILATING DROPS
1.0000 | OPHTHALMIC | Status: DC | PRN
  Administered 2022-07-14 (×3): 1 via OPHTHALMIC

## 2022-07-14 MED ORDER — LACTATED RINGERS IV SOLN: INTRAVENOUS | Status: DC

## 2022-07-14 MED ORDER — MOXIFLOXACIN HCL 0.5 % OP SOLN
OPHTHALMIC | Status: DC | PRN
  Administered 2022-07-14: .2 mL via OPHTHALMIC

## 2022-07-14 MED ORDER — MIDAZOLAM HCL 2 MG/2ML IJ SOLN
INTRAMUSCULAR | Status: DC | PRN
  Administered 2022-07-14: .5 mg via INTRAVENOUS
  Administered 2022-07-14: 1 mg via INTRAVENOUS

## 2022-07-14 MED ORDER — FENTANYL CITRATE (PF) 100 MCG/2ML IJ SOLN
INTRAMUSCULAR | Status: DC | PRN
  Administered 2022-07-14: 50 ug via INTRAVENOUS

## 2022-07-14 MED ORDER — BRIMONIDINE TARTRATE-TIMOLOL 0.2-0.5 % OP SOLN
OPHTHALMIC | Status: DC | PRN
  Administered 2022-07-14: 1 [drp] via OPHTHALMIC

## 2022-07-14 MED ORDER — SIGHTPATH DOSE#1 NA HYALUR & NA CHOND-NA HYALUR IO KIT
PACK | INTRAOCULAR | Status: DC | PRN
  Administered 2022-07-14: 1 via OPHTHALMIC

## 2022-07-14 MED ORDER — SIGHTPATH DOSE#1 BSS IO SOLN
INTRAOCULAR | Status: DC | PRN
  Administered 2022-07-14: 15 mL via INTRAOCULAR

## 2022-07-14 MED ORDER — SIGHTPATH DOSE#1 BSS IO SOLN
INTRAOCULAR | Status: DC | PRN
  Administered 2022-07-14: 2 mL

## 2022-07-14 MED ORDER — SODIUM CHLORIDE 0.9% FLUSH
INTRAVENOUS | Status: DC | PRN
  Administered 2022-07-14: 10 mL via INTRAVENOUS

## 2022-07-14 SURGICAL SUPPLY — 11 items
CANNULA ANT/CHMB 27G (MISCELLANEOUS) IMPLANT
CANNULA ANT/CHMB 27GA (MISCELLANEOUS) IMPLANT
CATARACT SUITE SIGHTPATH (MISCELLANEOUS) ×1 IMPLANT
FEE CATARACT SUITE SIGHTPATH (MISCELLANEOUS) ×1 IMPLANT
GLOVE SRG 8 PF TXTR STRL LF DI (GLOVE) ×1 IMPLANT
GLOVE SURG ENC TEXT LTX SZ7.5 (GLOVE) ×1 IMPLANT
GLOVE SURG UNDER POLY LF SZ8 (GLOVE) ×1
LENS IOL TECNIS EYHANCE 21.0 (Intraocular Lens) IMPLANT
NDL FILTER BLUNT 18X1 1/2 (NEEDLE) ×1 IMPLANT
NEEDLE FILTER BLUNT 18X1 1/2 (NEEDLE) ×1 IMPLANT
SYR 3ML LL SCALE MARK (SYRINGE) ×1 IMPLANT

## 2022-07-14 NOTE — Anesthesia Postprocedure Evaluation (Signed)
Anesthesia Post Note  Patient: Jamie Powers  Procedure(s) Performed: CATARACT EXTRACTION PHACO AND INTRAOCULAR LENS PLACEMENT (IOC) RIGHT DIABETIC 13.38 01:16.9 (Right: Eye)  Patient location during evaluation: PACU Anesthesia Type: MAC Level of consciousness: awake and alert Pain management: pain level controlled Vital Signs Assessment: post-procedure vital signs reviewed and stable Respiratory status: spontaneous breathing, nonlabored ventilation, respiratory function stable and patient connected to nasal cannula oxygen Cardiovascular status: stable and blood pressure returned to baseline Postop Assessment: no apparent nausea or vomiting Anesthetic complications: no   No notable events documented.   Last Vitals:  Vitals:   07/14/22 1055 07/14/22 1101  BP:  134/75  Pulse: 61   Resp: 16   Temp:    SpO2: 94%     Last Pain:  Vitals:   07/14/22 1101  TempSrc:   PainSc: 0-No pain                 [name]

## 2022-07-14 NOTE — H&P (Signed)
[facility]   Primary Care Physician:  Lyndon Code, MD Ophthalmologist: Dr. Lockie Mola  Pre-Procedure History & Physical: HPI:  Chrisann Melaragno is a 67 y.o. female here for ophthalmic surgery.   Past Medical History:  Diagnosis Date   GERD (gastroesophageal reflux disease)    Hypertension    Obesity    Pre-diabetes    Protein in urine     Past Surgical History:  Procedure Laterality Date   BREAST BIOPSY Left 2000   neg   BREAST SURGERY     CATARACT EXTRACTION W/PHACO Left 06/30/2022   Procedure: CATARACT EXTRACTION PHACO AND INTRAOCULAR LENS PLACEMENT (IOC) LEFT DIABETIC 10.40 01:05.1;  Surgeon: Lockie Mola, MD;  Location: [facility];  Service: Ophthalmology;  Laterality: Left;   CHOLECYSTECTOMY     COLONOSCOPY WITH PROPOFOL N/A 01/31/2015   Procedure: COLONOSCOPY WITH PROPOFOL;  Surgeon: Christena Deem, MD;  Location: [facility] ENDOSCOPY;  Service: Endoscopy;  Laterality: N/A;   TONSILLECTOMY      Prior to Admission medications   Medication Sig Start Date End Date Taking? Authorizing Provider  amLODipine (NORVASC) 2.5 MG tablet TAKE ONE TABLET BY MOUTH DAILY AT 9AM 07/09/22  Yes McDonough, Lauren K, PA-C  CALCIUM-VITAMIN D PO Take by mouth.   Yes [provider]  cetirizine (ZYRTEC) 10 MG tablet TAKE 1 TABLET(10 MG) BY MOUTH DAILY 01/11/22  Yes McDonough, Lauren K, PA-C  empagliflozin (JARDIANCE) 10 MG TABS tablet Take 1 tablet (10 mg total) by mouth daily. 01/11/22  Yes McDonough, Salomon Fick, PA-C  ergocalciferol (DRISDOL) 1.25 MG (50000 UT) capsule Take one cap q week 01/11/22  Yes McDonough, Lauren K, PA-C  FLUoxetine (PROZAC) 10 MG capsule TAKE 1 CAPSULE BY MOUTH EVERY DAY 03/30/22  Yes McDonough, Lauren K, PA-C  losartan (COZAAR) 50 MG tablet TAKE 1 TABLET(50 MG) BY MOUTH TWICE DAILY 07/12/22  Yes McDonough, Lauren K, PA-C  metroNIDAZOLE (FLAGYL) 500 MG tablet Take 1 tablet (500 mg total) by mouth 2 (two) times daily. 02/26/22  Yes McDonough,  Lauren K, PA-C  pantoprazole (PROTONIX) 40 MG tablet TAKE 1 TABLET(40 MG) BY MOUTH DAILY 02/08/22  Yes McDonough, Lauren K, PA-C  rosuvastatin (CRESTOR) 5 MG tablet Take 1 tablet (5 mg total) by mouth daily. 01/11/22  Yes McDonough, Lauren K, PA-C  Blood Glucose Monitoring Suppl (ONETOUCH VERIO) w/Device KIT Use as directed. E11.9 07/12/22   McDonough, Salomon Fick, PA-C  diclofenac Sodium (VOLTAREN) 1 % GEL Apply 4 g topically 4 (four) times daily. 06/07/19   Carlean Jews, NP  hydrocortisone 2.5 % cream Apply 1 application topically as needed.    [provider]  nystatin cream (MYCOSTATIN) Apply topically 3 (three) times daily. 05/22/18   Carlean Jews, NP  VENTOLIN HFA 108 (90 Base) MCG/ACT inhaler INHALE 2 PUFFS INTO THE LUNGS EVERY 6 HOURS AS NEEDED FOR WHEEZING OR SHORTNESS OF BREATH Patient not taking: Reported on 07/14/2022 07/18/20   Lyndon Code, MD    Allergies as of 04/21/2022 - Review Complete 04/12/2022  Allergen Reaction Noted   Penicillins  01/30/2015    Family History  Problem Relation Age of Onset   Breast cancer Paternal Aunt    Hypertension Mother    Heart disease Father     Social History   Socioeconomic History   Marital status: Married    Spouse name: Not on file   Number of children: Not on file   Years of education: Not on file   Highest education level:  Not on file  Occupational History   Not on file  Tobacco Use   Smoking status: Never   Smokeless tobacco: Never  Substance and Sexual Activity   Alcohol use: No   Drug use: No   Sexual activity: Not on file  Other Topics Concern   Not on file  Social History Narrative   Not on file   Social Determinants of Health   Financial Resource Strain: Low Risk  (04/16/2020)   Overall Financial Resource Strain (CARDIA)    Difficulty of Paying Living Expenses: Not hard at all  Food Insecurity: Not on file  Transportation Needs: Not on file  Physical Activity: Not on file  Stress: Not on file   Social Connections: Not on file  Intimate Partner Violence: Not on file    Review of Systems: See HPI, otherwise negative ROS  Physical Exam: BP (!) 129/93   Pulse 72   Temp (!) 97.2 F (36.2 C) (Temporal)   Resp 18   Ht 5' (1.524 m)   Wt 88.5 kg   SpO2 97%   BMI 38.08 kg/m  General:   Alert,  pleasant and cooperative in NAD Head:  Normocephalic and atraumatic. Lungs:  Clear to auscultation.    Heart:  Regular rate and rhythm.   Impression/Plan: Nattalie Santiesteban is here for ophthalmic surgery.  Risks, benefits, limitations, and alternatives regarding ophthalmic surgery have been reviewed with the patient.  Questions have been answered.  All parties agreeable.   Lockie Mola, MD  07/14/2022, 9:41 AM

## 2022-07-14 NOTE — Transfer of Care (Signed)
Immediate Anesthesia Transfer of Care Note  Patient: Jamie Powers  Procedure(s) Performed: CATARACT EXTRACTION PHACO AND INTRAOCULAR LENS PLACEMENT (IOC) RIGHT DIABETIC 13.38 01:16.9 (Right: Eye)  Patient Location: PACU  Anesthesia Type:MAC  Level of Consciousness: awake, alert , and oriented  Airway & Oxygen Therapy: Patient Spontanous Breathing  Post-op Assessment: Report given to RN and Post -op Vital signs reviewed and stable  Post vital signs: Reviewed and stable  Last  See PACU flow sheet VSS  Vitals Value Taken Time  BP 124/71 07/14/22 1054  Temp    Pulse 58 07/14/22 1055  Resp 16 07/14/22 1055  SpO2 94 % 07/14/22 1055  Vitals shown include unvalidated device data.  Last Pain:  Vitals:   07/14/22 0937  TempSrc: Temporal  PainSc: 0-No pain         Complications: No notable events documented.

## 2022-07-14 NOTE — Op Note (Signed)
LOCATION:  [facility]   PREOPERATIVE DIAGNOSIS:    Nuclear sclerotic cataract right eye. H25.11   POSTOPERATIVE DIAGNOSIS:  Nuclear sclerotic cataract right eye.     PROCEDURE:  Phacoemusification with posterior chamber intraocular lens placement of the right eye   ULTRASOUND TIME: Procedure(s): CATARACT EXTRACTION PHACO AND INTRAOCULAR LENS PLACEMENT (IOC) RIGHT DIABETIC 13.38 01:16.9 (Right)  LENS:   Implant Name Type Inv. Item Serial No. Manufacturer Lot No. LRB No. Used Action  LENS IOL TECNIS EYHANCE 21.0 - [id number] Intraocular Lens LENS IOL TECNIS EYHANCE 21.0 [id number] [employer]  Right 1 Implanted         SURGEON:  Deirdre Evener, MD   ANESTHESIA:  Topical with tetracaine drops and 2% Xylocaine jelly, augmented with 1% preservative-free intracameral lidocaine.    COMPLICATIONS:  None.   DESCRIPTION OF PROCEDURE:  The patient was identified in the holding room and transported to the operating room and placed in the supine position under the operating microscope.  The right eye was identified as the operative eye and it was prepped and draped in the usual sterile ophthalmic fashion.   A 1 millimeter clear-corneal paracentesis was made at the 12:00 position.  0.5 ml of preservative-free 1% lidocaine was injected into the anterior chamber. The anterior chamber was filled with Viscoat viscoelastic.  A 2.4 millimeter keratome was used to make a near-clear corneal incision at the 9:00 position.  A curvilinear capsulorrhexis was made with a cystotome and capsulorrhexis forceps.  Balanced salt solution was used to hydrodissect and hydrodelineate the nucleus.   Phacoemulsification was then used in stop and chop fashion to remove the lens nucleus and epinucleus.  The remaining cortex was then removed using the irrigation and aspiration handpiece. Provisc was then placed into the capsular bag to distend it for lens placement.  A lens was then injected into the capsular  bag.  The remaining viscoelastic was aspirated.   Wounds were hydrated with balanced salt solution.  The anterior chamber was inflated to a physiologic pressure with balanced salt solution.  No wound leaks were noted. Vigamox 0.2 ml of a 1mg  per ml solution was injected into the anterior chamber for a dose of 0.2 mg of intracameral antibiotic at the completion of the case.   Timolol and Brimonidine drops were applied to the eye.  The patient was taken to the recovery room in stable condition without complications of anesthesia or surgery.   Chrystine Frogge 07/14/2022, 10:51 AM

## 2022-07-15 ENCOUNTER — Encounter: Payer: Self-pay | Admitting: Ophthalmology

## 2022-07-16 DIAGNOSIS — E782 Mixed hyperlipidemia: Secondary | ICD-10-CM | POA: Diagnosis not present

## 2022-07-16 DIAGNOSIS — R7303 Prediabetes: Secondary | ICD-10-CM | POA: Diagnosis not present

## 2022-07-16 DIAGNOSIS — E538 Deficiency of other specified B group vitamins: Secondary | ICD-10-CM | POA: Diagnosis not present

## 2022-07-16 DIAGNOSIS — R946 Abnormal results of thyroid function studies: Secondary | ICD-10-CM | POA: Diagnosis not present

## 2022-07-16 DIAGNOSIS — E559 Vitamin D deficiency, unspecified: Secondary | ICD-10-CM | POA: Diagnosis not present

## 2022-07-16 DIAGNOSIS — R5383 Other fatigue: Secondary | ICD-10-CM | POA: Diagnosis not present

## 2022-07-17 LAB — CBC WITH DIFFERENTIAL/PLATELET
Basophils Absolute: 0.1 10*3/uL (ref 0.0–0.2)
Basos: 1 %
EOS (ABSOLUTE): 0.3 10*3/uL (ref 0.0–0.4)
Eos: 4 %
Hematocrit: 43.4 % (ref 34.0–46.6)
Hemoglobin: 14 g/dL (ref 11.1–15.9)
Immature Grans (Abs): 0 10*3/uL (ref 0.0–0.1)
Immature Granulocytes: 0 %
Lymphocytes Absolute: 2.2 10*3/uL (ref 0.7–3.1)
Lymphs: 34 %
MCH: 29.6 pg (ref 26.6–33.0)
MCHC: 32.3 g/dL (ref 31.5–35.7)
MCV: 92 fL (ref 79–97)
Monocytes Absolute: 0.4 10*3/uL (ref 0.1–0.9)
Monocytes: 6 %
Neutrophils Absolute: 3.7 10*3/uL (ref 1.4–7.0)
Neutrophils: 55 %
Platelets: 294 10*3/uL (ref 150–450)
RBC: 4.73 x10E6/uL (ref 3.77–5.28)
RDW: 12.8 % (ref 11.7–15.4)
WBC: 6.6 10*3/uL (ref 3.4–10.8)

## 2022-07-17 LAB — COMPREHENSIVE METABOLIC PANEL
ALT: 38 IU/L — ABNORMAL HIGH (ref 0–32)
AST: 34 IU/L (ref 0–40)
Albumin: 3.9 g/dL (ref 3.9–4.9)
Alkaline Phosphatase: 140 IU/L — ABNORMAL HIGH (ref 44–121)
BUN/Creatinine Ratio: 26 (ref 12–28)
BUN: 18 mg/dL (ref 8–27)
Bilirubin Total: 0.4 mg/dL (ref 0.0–1.2)
CO2: 25 mmol/L (ref 20–29)
Calcium: 9.5 mg/dL (ref 8.7–10.3)
Chloride: 99 mmol/L (ref 96–106)
Creatinine, Ser: 0.69 mg/dL (ref 0.57–1.00)
Globulin, Total: 2.9 g/dL (ref 1.5–4.5)
Glucose: 95 mg/dL (ref 70–99)
Potassium: 4.3 mmol/L (ref 3.5–5.2)
Sodium: 140 mmol/L (ref 134–144)
Total Protein: 6.8 g/dL (ref 6.0–8.5)
eGFR: 94 mL/min/{1.73_m2} (ref >59)

## 2022-07-17 LAB — LIPID PANEL WITH LDL/HDL RATIO
Cholesterol, Total: 145 mg/dL (ref 100–199)
HDL: 67 mg/dL (ref >39)
LDL Chol Calc (NIH): 66 mg/dL (ref 0–99)
LDL/HDL Ratio: 1 ratio (ref 0.0–3.2)
Triglycerides: 59 mg/dL (ref 0–149)
VLDL Cholesterol Cal: 12 mg/dL (ref 5–40)

## 2022-07-17 LAB — B12 AND FOLATE PANEL
Folate: 7.1 ng/mL (ref >3.0)
Vitamin B-12: 407 pg/mL (ref 232–1245)

## 2022-07-17 LAB — TSH+FREE T4
Free T4: 1.22 ng/dL (ref 0.82–1.77)
TSH: 1.32 u[IU]/mL (ref 0.450–4.500)

## 2022-07-17 LAB — HGB A1C W/O EAG: Hgb A1c MFr Bld: 6.1 % — ABNORMAL HIGH (ref 4.8–5.6)

## 2022-07-17 LAB — VITAMIN D 25 HYDROXY (VIT D DEFICIENCY, FRACTURES): Vit D, 25-Hydroxy: 57.2 ng/mL (ref 30.0–100.0)

## 2022-07-19 ENCOUNTER — Other Ambulatory Visit: Payer: Self-pay

## 2022-07-20 ENCOUNTER — Telehealth: Payer: Self-pay

## 2022-07-20 DIAGNOSIS — Z1231 Encounter for screening mammogram for malignant neoplasm of breast: Secondary | ICD-10-CM | POA: Diagnosis not present

## 2022-07-20 NOTE — Telephone Encounter (Signed)
Spoke with patient regarding lab results. 

## 2022-07-20 NOTE — Telephone Encounter (Signed)
-----   Message from Carlean Jews sent at 07/20/2022  1:05 PM EDT ----- Please let pt know that her labs overall look good. Her liver enzymes were borderline and please ask if drinking alcohol or taking excess tylenol as this can cause this. We will monitor. A1c stable at 6.1. cholesterol greatly improved as is vit D. Protein seen in urine--continue to follow up with nephrology as before.

## 2022-07-30 ENCOUNTER — Other Ambulatory Visit: Payer: Self-pay | Admitting: Physician Assistant

## 2022-07-30 DIAGNOSIS — E782 Mixed hyperlipidemia: Secondary | ICD-10-CM

## 2022-08-12 ENCOUNTER — Other Ambulatory Visit: Payer: Self-pay

## 2022-08-12 DIAGNOSIS — I10 Essential (primary) hypertension: Secondary | ICD-10-CM

## 2022-08-12 MED ORDER — LOSARTAN POTASSIUM 50 MG PO TABS
ORAL_TABLET | ORAL | 1 refills | Status: DC
Start: 2022-08-12 — End: 2022-12-31

## 2022-09-14 DIAGNOSIS — R809 Proteinuria, unspecified: Secondary | ICD-10-CM | POA: Diagnosis not present

## 2022-09-14 DIAGNOSIS — N181 Chronic kidney disease, stage 1: Secondary | ICD-10-CM | POA: Diagnosis not present

## 2022-09-14 DIAGNOSIS — I10 Essential (primary) hypertension: Secondary | ICD-10-CM | POA: Diagnosis not present

## 2022-09-20 DIAGNOSIS — I10 Essential (primary) hypertension: Secondary | ICD-10-CM | POA: Diagnosis not present

## 2022-09-20 DIAGNOSIS — N181 Chronic kidney disease, stage 1: Secondary | ICD-10-CM | POA: Diagnosis not present

## 2022-09-20 DIAGNOSIS — R809 Proteinuria, unspecified: Secondary | ICD-10-CM | POA: Diagnosis not present

## 2022-09-22 DIAGNOSIS — Z6837 Body mass index (BMI) 37.0-37.9, adult: Secondary | ICD-10-CM | POA: Diagnosis not present

## 2022-09-22 DIAGNOSIS — E785 Hyperlipidemia, unspecified: Secondary | ICD-10-CM | POA: Diagnosis not present

## 2022-09-22 DIAGNOSIS — E1169 Type 2 diabetes mellitus with other specified complication: Secondary | ICD-10-CM | POA: Diagnosis not present

## 2022-09-22 DIAGNOSIS — Z008 Encounter for other general examination: Secondary | ICD-10-CM | POA: Diagnosis not present

## 2022-10-14 ENCOUNTER — Encounter: Payer: Self-pay | Admitting: Physician Assistant

## 2022-10-14 ENCOUNTER — Ambulatory Visit (INDEPENDENT_AMBULATORY_CARE_PROVIDER_SITE_OTHER): Payer: No Typology Code available for payment source | Admitting: Physician Assistant

## 2022-10-14 VITALS — BP 120/78 | HR 80 | Temp 98.3°F | Resp 16 | Ht 60.0 in | Wt 193.6 lb

## 2022-10-14 DIAGNOSIS — I10 Essential (primary) hypertension: Secondary | ICD-10-CM | POA: Diagnosis not present

## 2022-10-14 DIAGNOSIS — E538 Deficiency of other specified B group vitamins: Secondary | ICD-10-CM

## 2022-10-14 DIAGNOSIS — M25552 Pain in left hip: Secondary | ICD-10-CM | POA: Diagnosis not present

## 2022-10-14 DIAGNOSIS — R7303 Prediabetes: Secondary | ICD-10-CM | POA: Diagnosis not present

## 2022-10-14 DIAGNOSIS — W19XXXA Unspecified fall, initial encounter: Secondary | ICD-10-CM

## 2022-10-14 DIAGNOSIS — E669 Obesity, unspecified: Secondary | ICD-10-CM

## 2022-10-14 MED ORDER — SEMAGLUTIDE(0.25 OR 0.5MG/DOS) 2 MG/3ML ~~LOC~~ SOPN
0.2500 mg | PEN_INJECTOR | SUBCUTANEOUS | 0 refills | Status: DC
Start: 2022-10-14 — End: 2023-11-03

## 2022-10-14 MED ORDER — CYANOCOBALAMIN 1000 MCG/ML IJ SOLN
1000.0000 ug | Freq: Once | INTRAMUSCULAR | Status: AC
Start: 2022-10-14 — End: 2022-10-14
  Administered 2022-10-14: 1000 ug via INTRAMUSCULAR

## 2022-10-14 NOTE — Progress Notes (Signed)
[facility] [address] [place], [place] [address]  Internal MEDICINE  Office Visit Note  Patient Name: Jamie Powers  [id number]  [id number]  Date of Service: 10/20/2022  Chief Complaint  Patient presents with   Follow-up   Gastroesophageal Reflux   Hypertension    HPI Pt is here for routine follow up -Did have a fall and hit left hip on Sunday. [place] and [place] shoes caught when she went to move a table and fell on hip. Did not hit head. Able to walk. Will get xray -BP stable -Went to nephrology and they raised jardiance to 25mg . They recommended she try ozempic. Discussed trying to get approval  Current Medication: Outpatient Encounter Medications as of 10/14/2022  Medication Sig   amLODipine (NORVASC) 2.5 MG tablet TAKE ONE TABLET BY MOUTH DAILY AT 9AM   Blood Glucose Monitoring Suppl (ONETOUCH VERIO) w/Device KIT Use as directed. E11.9   CALCIUM-VITAMIN D PO Take by mouth.   cetirizine (ZYRTEC) 10 MG tablet TAKE 1 TABLET(10 MG) BY MOUTH DAILY   diclofenac Sodium (VOLTAREN) 1 % GEL Apply 4 g topically 4 (four) times daily.   empagliflozin (JARDIANCE) 25 MG TABS tablet Take 25 mg by mouth daily.   ergocalciferol (DRISDOL) 1.25 MG (50000 UT) capsule Take one cap q week   FLUoxetine (PROZAC) 10 MG capsule TAKE 1 CAPSULE BY MOUTH EVERY DAY   hydrocortisone 2.5 % cream Apply 1 application topically as needed.   losartan (COZAAR) 50 MG tablet TAKE 1 TABLET(50 MG) BY MOUTH TWICE DAILY   metroNIDAZOLE (FLAGYL) 500 MG tablet Take 1 tablet (500 mg total) by mouth 2 (two) times daily.   nystatin cream (MYCOSTATIN) Apply topically 3 (three) times daily.   pantoprazole (PROTONIX) 40 MG tablet TAKE 1 TABLET(40 MG) BY MOUTH DAILY   rosuvastatin (CRESTOR) 5 MG tablet TAKE ONE TABLET BY MOUTH DAILY AT 5PM   Semaglutide,0.25 or 0.5MG /DOS, 2 MG/3ML SOPN Inject 0.25 mg into the skin once a week.   VENTOLIN HFA 108 (90 Base) MCG/ACT inhaler INHALE 2 PUFFS INTO THE  LUNGS EVERY 6 HOURS AS NEEDED FOR WHEEZING OR SHORTNESS OF BREATH   [DISCONTINUED] empagliflozin (JARDIANCE) 10 MG TABS tablet Take 1 tablet (10 mg total) by mouth daily.   [EXPIRED] cyanocobalamin (VITAMIN B12) injection 1,000 mcg    No facility-administered encounter medications on file as of 10/14/2022.    Surgical History: Past Surgical History:  Procedure Laterality Date   BREAST BIOPSY Left 2000   neg   BREAST SURGERY     CATARACT EXTRACTION W/PHACO Left 06/30/2022   Procedure: CATARACT EXTRACTION PHACO AND INTRAOCULAR LENS PLACEMENT (IOC) LEFT DIABETIC 10.40 01:05.1;  Surgeon: Lockie Mola, MD;  Location: [facility];  Service: Ophthalmology;  Laterality: Left;   CATARACT EXTRACTION W/PHACO Right 07/14/2022   Procedure: CATARACT EXTRACTION PHACO AND INTRAOCULAR LENS PLACEMENT (IOC) RIGHT DIABETIC 13.38 01:16.9;  Surgeon: Lockie Mola, MD;  Location: [facility];  Service: Ophthalmology;  Laterality: Right;   CHOLECYSTECTOMY     COLONOSCOPY WITH PROPOFOL N/A 01/31/2015   Procedure: COLONOSCOPY WITH PROPOFOL;  Surgeon: Christena Deem, MD;  Location: [facility] ENDOSCOPY;  Service: Endoscopy;  Laterality: N/A;   TONSILLECTOMY      Medical History: Past Medical History:  Diagnosis Date   GERD (gastroesophageal reflux disease)    Hypertension    Obesity    Pre-diabetes    Protein in urine     Family History: Family History  Problem Relation Age of Onset  Breast cancer Paternal Aunt    Hypertension Mother    Heart disease Father     Social History   Socioeconomic History   Marital status: Married    Spouse name: Not on file   Number of children: Not on file   Years of education: Not on file   Highest education level: Not on file  Occupational History   Not on file  Tobacco Use   Smoking status: Never   Smokeless tobacco: Never  Substance and Sexual Activity   Alcohol use: No   Drug use: No   Sexual activity: Not on file  Other  Topics Concern   Not on file  Social History Narrative   Not on file   Social Determinants of Health   Financial Resource Strain: Low Risk  (04/16/2020)   Overall Financial Resource Strain (CARDIA)    Difficulty of Paying Living Expenses: Not hard at all  Food Insecurity: Not on file  Transportation Needs: Not on file  Physical Activity: Not on file  Stress: Not on file  Social Connections: Not on file  Intimate Partner Violence: Not on file      Review of Systems  Constitutional:  Negative for chills, diaphoresis and fatigue.  HENT:  Negative for ear pain, postnasal drip and sinus pressure.   Eyes:  Negative for photophobia, discharge, redness, itching and visual disturbance.  Respiratory:  Negative for cough, shortness of breath and wheezing.   Cardiovascular:  Negative for chest pain, palpitations and leg swelling.  Gastrointestinal:  Negative for abdominal pain, constipation, diarrhea, nausea and vomiting.  Genitourinary:  Negative for dysuria and flank pain.  Musculoskeletal:  Positive for arthralgias and myalgias. Negative for back pain, gait problem and neck pain.       Left hip pain  Skin:  Negative for color change.  Allergic/Immunologic: Negative for environmental allergies and food allergies.  Neurological:  Negative for dizziness and headaches.  Hematological:  Does not bruise/bleed easily.  Psychiatric/Behavioral:  Negative for agitation, behavioral problems (depression) and hallucinations.     Vital Signs: BP 120/78   Pulse 80   Temp 98.3 F (36.8 C)   Resp 16   Ht 5' (1.524 m)   Wt 193 lb 9.6 oz (87.8 kg)   SpO2 95%   BMI 37.81 kg/m    Physical Exam Vitals and nursing note reviewed.  Constitutional:      General: She is not in acute distress.    Appearance: Normal appearance. She is well-developed. She is obese. She is not diaphoretic.  HENT:     Head: Normocephalic and atraumatic.     Mouth/Throat:     Pharynx: No oropharyngeal exudate.  Eyes:      Pupils: Pupils are equal, round, and reactive to light.  Neck:     Thyroid: No thyromegaly.     Vascular: No JVD.     Trachea: No tracheal deviation.  Cardiovascular:     Rate and Rhythm: Normal rate and regular rhythm.     Heart sounds: Normal heart sounds.  Pulmonary:     Effort: Pulmonary effort is normal.  Chest:     Chest wall: No tenderness.  Breasts:    Right: Normal. No mass.     Left: Normal. No mass.  Abdominal:     General: Bowel sounds are normal.     Palpations: Abdomen is soft.     Tenderness: There is no abdominal tenderness.  Musculoskeletal:        General: Tenderness present.  Normal range of motion.     Cervical back: Normal range of motion and neck supple.  Lymphadenopathy:     Cervical: No cervical adenopathy.  Skin:    General: Skin is warm and dry.  Neurological:     Mental Status: She is alert and oriented to person, place, and time.     Cranial Nerves: No cranial nerve deficit.  Psychiatric:        Behavior: Behavior normal.        Thought Content: Thought content normal.        Judgment: Judgment normal.        Assessment/Plan: 1. Prediabetes Will start on ozempic to help with BG and wt while - Semaglutide,0.25 or 0.5MG /DOS, 2 MG/3ML SOPN; Inject 0.25 mg into the skin once a week.  Dispense: 3 mL; Refill: 0  2. Left hip pain Will check xray - DG Hip Unilat W OR W/O Pelvis 2-3 Views Left; Future  3. Fall, initial encounter - DG Hip Unilat W OR W/O Pelvis 2-3 Views Left; Future  4. B12 deficiency - cyanocobalamin (VITAMIN B12) injection 1,000 mcg  5. Obesity (BMI 30-39.9) - Semaglutide,0.25 or 0.5MG /DOS, 2 MG/3ML SOPN; Inject 0.25 mg into the skin once a week.  Dispense: 3 mL; Refill: 0  6. Essential hypertension Stable, continue current medication    General Counseling: Yarima verbalizes understanding of the findings of todays visit and agrees with plan of treatment. I have discussed any further diagnostic evaluation that may  be needed or ordered today. We also reviewed her medications today. she has been encouraged to call the office with any questions or concerns that should arise related to todays visit.    Orders Placed This Encounter  Procedures   DG Hip Unilat W OR W/O Pelvis 2-3 Views Left    Meds ordered this encounter  Medications   Semaglutide,0.25 or 0.5MG /DOS, 2 MG/3ML SOPN    Sig: Inject 0.25 mg into the skin once a week.    Dispense:  3 mL    Refill:  0   cyanocobalamin (VITAMIN B12) injection 1,000 mcg    This patient was seen by Lynn Ito, PA-C in collaboration with Dr. Beverely Risen as a part of collaborative care agreement.   Total time spent:30 Minutes Time spent includes review of chart, medications, test results, and follow up plan with the patient.      Dr Lyndon Code Internal medicine

## 2022-10-19 ENCOUNTER — Ambulatory Visit
Admission: RE | Admit: 2022-10-19 | Discharge: 2022-10-19 | Disposition: A | Discharge status: Home / Self Care | Payer: No Typology Code available for payment source | Source: Ambulatory Visit | Attending: Physician Assistant | Admitting: Physician Assistant

## 2022-10-19 DIAGNOSIS — M25552 Pain in left hip: Secondary | ICD-10-CM

## 2022-10-19 DIAGNOSIS — W19XXXA Unspecified fall, initial encounter: Secondary | ICD-10-CM

## 2022-10-27 ENCOUNTER — Other Ambulatory Visit: Payer: Self-pay | Admitting: Physician Assistant

## 2022-10-27 DIAGNOSIS — E559 Vitamin D deficiency, unspecified: Secondary | ICD-10-CM

## 2022-11-16 ENCOUNTER — Telehealth: Payer: Self-pay

## 2022-11-16 NOTE — Telephone Encounter (Signed)
-----   Message from Carlean Jews sent at 11/16/2022 10:40 AM EST ----- Please let her know that her xray showed no acute findings, just a little joint space narrowing (arthritis)

## 2022-11-16 NOTE — Telephone Encounter (Signed)
Pt notified her xray result

## 2022-11-25 ENCOUNTER — Encounter: Payer: Self-pay | Admitting: Physician Assistant

## 2022-11-25 ENCOUNTER — Ambulatory Visit (INDEPENDENT_AMBULATORY_CARE_PROVIDER_SITE_OTHER): Payer: No Typology Code available for payment source | Admitting: Physician Assistant

## 2022-11-25 VITALS — BP 125/85 | HR 75 | Temp 98.4°F | Resp 16 | Ht 60.0 in | Wt 189.0 lb

## 2022-11-25 DIAGNOSIS — G8929 Other chronic pain: Secondary | ICD-10-CM | POA: Diagnosis not present

## 2022-11-25 DIAGNOSIS — M25561 Pain in right knee: Secondary | ICD-10-CM | POA: Diagnosis not present

## 2022-11-25 DIAGNOSIS — E669 Obesity, unspecified: Secondary | ICD-10-CM | POA: Diagnosis not present

## 2022-11-25 DIAGNOSIS — E538 Deficiency of other specified B group vitamins: Secondary | ICD-10-CM

## 2022-11-25 MED ORDER — CYANOCOBALAMIN 1000 MCG/ML IJ SOLN
1000.0000 ug | Freq: Once | INTRAMUSCULAR | Status: AC
Start: 2022-11-25 — End: 2022-11-25
  Administered 2022-11-25: 1000 ug via INTRAMUSCULAR

## 2022-11-25 NOTE — Progress Notes (Signed)
[facility] [address] [place], [place] [address]  Internal MEDICINE  Office Visit Note  Patient Name: Jamie Powers  [id number]  [id number]  Date of Service: 12/07/2022  Chief Complaint  Patient presents with   Follow-up   Gastroesophageal Reflux   Hypertension   Quality Metric Gaps    Colonoscopy    HPI Pt is here for routine follow up -Some pain in right knee and radiates up into thigh. Hurts to stand too long or walk. Will go ahead and refer to ortho for further evaluation -will call insurance about wt loss med options, will let us know if wegovy/zepbound is an option since she did not qualify for ozempic due to being prediabetic  Current Medication: Outpatient Encounter Medications as of 11/25/2022  Medication Sig   amLODipine (NORVASC) 2.5 MG tablet TAKE ONE TABLET BY MOUTH DAILY AT 9AM   Blood Glucose Monitoring Suppl (ONETOUCH VERIO) w/Device KIT Use as directed. E11.9   CALCIUM-VITAMIN D PO Take by mouth.   cetirizine (ZYRTEC) 10 MG tablet TAKE 1 TABLET(10 MG) BY MOUTH DAILY   diclofenac Sodium (VOLTAREN) 1 % GEL Apply 4 g topically 4 (four) times daily.   empagliflozin (JARDIANCE) 25 MG TABS tablet Take 25 mg by mouth daily.   FLUoxetine (PROZAC) 10 MG capsule TAKE 1 CAPSULE BY MOUTH EVERY DAY   hydrocortisone 2.5 % cream Apply 1 application topically as needed.   losartan (COZAAR) 50 MG tablet TAKE 1 TABLET(50 MG) BY MOUTH TWICE DAILY   metroNIDAZOLE (FLAGYL) 500 MG tablet Take 1 tablet (500 mg total) by mouth 2 (two) times daily.   nystatin cream (MYCOSTATIN) Apply topically 3 (three) times daily.   pantoprazole (PROTONIX) 40 MG tablet TAKE 1 TABLET(40 MG) BY MOUTH DAILY   rosuvastatin (CRESTOR) 5 MG tablet TAKE ONE TABLET BY MOUTH DAILY AT 5PM   Semaglutide,0.25 or 0.5MG /DOS, 2 MG/3ML SOPN Inject 0.25 mg into the skin once a week.   VENTOLIN HFA 108 (90 Base) MCG/ACT inhaler INHALE 2 PUFFS INTO THE LUNGS EVERY 6 HOURS AS NEEDED FOR WHEEZING OR  SHORTNESS OF BREATH   Vitamin D, Ergocalciferol, (DRISDOL) 1.25 MG (50000 UNIT) CAPS capsule TAKE 1 CAPSULE BY MOUTH EVERY MONDAY AT 9AM   [EXPIRED] cyanocobalamin (VITAMIN B12) injection 1,000 mcg    No facility-administered encounter medications on file as of 11/25/2022.    Surgical History: Past Surgical History:  Procedure Laterality Date   BREAST BIOPSY Left 2000   neg   BREAST SURGERY     CATARACT EXTRACTION W/PHACO Left 06/30/2022   Procedure: CATARACT EXTRACTION PHACO AND INTRAOCULAR LENS PLACEMENT (IOC) LEFT DIABETIC 10.40 01:05.1;  Surgeon: Lockie Mola, MD;  Location: [facility];  Service: Ophthalmology;  Laterality: Left;   CATARACT EXTRACTION W/PHACO Right 07/14/2022   Procedure: CATARACT EXTRACTION PHACO AND INTRAOCULAR LENS PLACEMENT (IOC) RIGHT DIABETIC 13.38 01:16.9;  Surgeon: Lockie Mola, MD;  Location: [facility];  Service: Ophthalmology;  Laterality: Right;   CHOLECYSTECTOMY     COLONOSCOPY WITH PROPOFOL N/A 01/31/2015   Procedure: COLONOSCOPY WITH PROPOFOL;  Surgeon: Christena Deem, MD;  Location: [facility] ENDOSCOPY;  Service: Endoscopy;  Laterality: N/A;   TONSILLECTOMY      Medical History: Past Medical History:  Diagnosis Date   GERD (gastroesophageal reflux disease)    Hypertension    Obesity    Pre-diabetes    Protein in urine     Family History: Family History  Problem Relation Age of Onset   Breast cancer Paternal Aunt  Hypertension Mother    Heart disease Father     Social History   Socioeconomic History   Marital status: Married    Spouse name: Not on file   Number of children: Not on file   Years of education: Not on file   Highest education level: Not on file  Occupational History   Not on file  Tobacco Use   Smoking status: Never   Smokeless tobacco: Never  Substance and Sexual Activity   Alcohol use: No   Drug use: No   Sexual activity: Not on file  Other Topics Concern   Not on file  Social  History Narrative   Not on file   Social Determinants of Health   Financial Resource Strain: Low Risk  (04/16/2020)   Overall Financial Resource Strain (CARDIA)    Difficulty of Paying Living Expenses: Not hard at all  Food Insecurity: Not on file  Transportation Needs: Not on file  Physical Activity: Not on file  Stress: Not on file  Social Connections: Not on file  Intimate Partner Violence: Not on file      Review of Systems  Constitutional:  Negative for chills, diaphoresis and fatigue.  HENT:  Negative for ear pain, postnasal drip and sinus pressure.   Eyes:  Negative for photophobia, discharge, redness, itching and visual disturbance.  Respiratory:  Negative for cough, shortness of breath and wheezing.   Cardiovascular:  Negative for chest pain, palpitations and leg swelling.  Gastrointestinal:  Negative for abdominal pain, constipation, diarrhea, nausea and vomiting.  Genitourinary:  Negative for dysuria and flank pain.  Musculoskeletal:  Positive for arthralgias, gait problem and myalgias. Negative for back pain and neck pain.  Skin:  Negative for color change.  Allergic/Immunologic: Negative for environmental allergies and food allergies.  Neurological:  Negative for dizziness and headaches.  Hematological:  Does not bruise/bleed easily.  Psychiatric/Behavioral:  Negative for agitation, behavioral problems (depression) and hallucinations.     Vital Signs: BP 125/85   Pulse 75   Temp 98.4 F (36.9 C)   Resp 16   Ht 5' (1.524 m)   Wt 189 lb (85.7 kg)   SpO2 95%   BMI 36.91 kg/m    Physical Exam Vitals and nursing note reviewed.  Constitutional:      General: She is not in acute distress.    Appearance: Normal appearance. She is well-developed. She is obese. She is not diaphoretic.  HENT:     Head: Normocephalic and atraumatic.     Mouth/Throat:     Pharynx: No oropharyngeal exudate.  Eyes:     Pupils: Pupils are equal, round, and reactive to light.   Neck:     Thyroid: No thyromegaly.     Vascular: No JVD.     Trachea: No tracheal deviation.  Cardiovascular:     Rate and Rhythm: Normal rate and regular rhythm.     Heart sounds: Normal heart sounds.  Pulmonary:     Effort: Pulmonary effort is normal.  Chest:     Chest wall: No tenderness.  Breasts:    Right: Normal. No mass.     Left: Normal. No mass.  Abdominal:     General: Bowel sounds are normal.     Palpations: Abdomen is soft.     Tenderness: There is no abdominal tenderness.  Musculoskeletal:        General: Normal range of motion.     Cervical back: Normal range of motion and neck supple.  Comments: Pain in right knee with standing or walking, non TTP  Lymphadenopathy:     Cervical: No cervical adenopathy.  Skin:    General: Skin is warm and dry.  Neurological:     Mental Status: She is alert and oriented to person, place, and time.     Cranial Nerves: No cranial nerve deficit.  Psychiatric:        Behavior: Behavior normal.        Thought Content: Thought content normal.        Judgment: Judgment normal.        Assessment/Plan: 1. Chronic pain of right knee - AMB referral to orthopedics  2. B12 deficiency - cyanocobalamin (VITAMIN B12) injection 1,000 mcg  3. Obesity (BMI 30-39.9) Pt will check with insurance about wt loss medication coverage   General Counseling: Jamie Powers verbalizes understanding of the findings of todays visit and agrees with plan of treatment. I have discussed any further diagnostic evaluation that may be needed or ordered today. We also reviewed her medications today. she has been encouraged to call the office with any questions or concerns that should arise related to todays visit.    Orders Placed This Encounter  Procedures   AMB referral to orthopedics    Meds ordered this encounter  Medications   cyanocobalamin (VITAMIN B12) injection 1,000 mcg    This patient was seen by Lynn Ito, PA-C in collaboration  with Dr. Beverely Risen as a part of collaborative care agreement.   Total time spent:30 Minutes Time spent includes review of chart, medications, test results, and follow up plan with the patient.      Dr Lyndon Code Internal medicine

## 2022-11-26 ENCOUNTER — Telehealth: Payer: Self-pay | Admitting: Internal Medicine

## 2022-11-26 NOTE — Telephone Encounter (Signed)
Awaiting 11/25/22 office notes for Orthopedic referral-[facility]

## 2022-12-09 ENCOUNTER — Other Ambulatory Visit: Payer: Self-pay | Admitting: Physician Assistant

## 2022-12-13 ENCOUNTER — Telehealth: Payer: Self-pay | Admitting: Internal Medicine

## 2022-12-13 NOTE — Telephone Encounter (Signed)
Orthopedic referral sent via [facility] to [facility].  Notified patient. Gave telephone # [phone]

## 2022-12-23 ENCOUNTER — Telehealth: Payer: Self-pay | Admitting: Physician Assistant

## 2022-12-23 NOTE — Telephone Encounter (Signed)
Orthopedic appointment 12/24/22 @ [facility]

## 2022-12-24 DIAGNOSIS — M25561 Pain in right knee: Secondary | ICD-10-CM | POA: Diagnosis not present

## 2022-12-24 DIAGNOSIS — M7061 Trochanteric bursitis, right hip: Secondary | ICD-10-CM | POA: Diagnosis not present

## 2022-12-24 DIAGNOSIS — G5711 Meralgia paresthetica, right lower limb: Secondary | ICD-10-CM | POA: Diagnosis not present

## 2022-12-24 DIAGNOSIS — E6609 Other obesity due to excess calories: Secondary | ICD-10-CM | POA: Diagnosis not present

## 2022-12-24 DIAGNOSIS — M7631 Iliotibial band syndrome, right leg: Secondary | ICD-10-CM | POA: Diagnosis not present

## 2022-12-24 DIAGNOSIS — Z6838 Body mass index (BMI) 38.0-38.9, adult: Secondary | ICD-10-CM | POA: Diagnosis not present

## 2022-12-24 DIAGNOSIS — E66812 Obesity, class 2: Secondary | ICD-10-CM | POA: Diagnosis not present

## 2022-12-31 ENCOUNTER — Other Ambulatory Visit: Payer: Self-pay | Admitting: Physician Assistant

## 2022-12-31 DIAGNOSIS — I10 Essential (primary) hypertension: Secondary | ICD-10-CM

## 2022-12-31 DIAGNOSIS — E782 Mixed hyperlipidemia: Secondary | ICD-10-CM

## 2023-01-13 ENCOUNTER — Ambulatory Visit: Payer: No Typology Code available for payment source | Attending: Student

## 2023-01-13 DIAGNOSIS — R262 Difficulty in walking, not elsewhere classified: Secondary | ICD-10-CM | POA: Insufficient documentation

## 2023-01-13 DIAGNOSIS — M79651 Pain in right thigh: Secondary | ICD-10-CM | POA: Diagnosis not present

## 2023-01-13 DIAGNOSIS — M25561 Pain in right knee: Secondary | ICD-10-CM | POA: Insufficient documentation

## 2023-01-13 DIAGNOSIS — G8929 Other chronic pain: Secondary | ICD-10-CM | POA: Insufficient documentation

## 2023-01-13 DIAGNOSIS — M25551 Pain in right hip: Secondary | ICD-10-CM | POA: Insufficient documentation

## 2023-01-13 NOTE — Therapy (Signed)
 OUTPATIENT PHYSICAL THERAPY EVALUATION   Patient Name: Jamie Powers MRN: [id number] DOB:1955-11-12, 68 y.o., female Today's Date: 01/13/2023  END OF SESSION:  PT End of Session - 01/13/23 0818     Visit Number 1    Number of Visits 17    Date for PT Re-Evaluation 03/11/23    PT Start Time 0818    PT Stop Time 0902    PT Time Calculation (min) 44 min    Activity Tolerance Patient tolerated treatment well    Behavior During Therapy WFL for tasks assessed/performed             Past Medical History:  Diagnosis Date   GERD (gastroesophageal reflux disease)    Hypertension    Obesity    Pre-diabetes    Protein in urine    Past Surgical History:  Procedure Laterality Date   BREAST BIOPSY Left 2000   neg   BREAST SURGERY     CATARACT EXTRACTION W/PHACO Left 06/30/2022   Procedure: CATARACT EXTRACTION PHACO AND INTRAOCULAR LENS PLACEMENT (IOC) LEFT DIABETIC 10.40 01:05.1;  Surgeon: Mittie Gaskin, MD;  Location: [facility];  Service: Ophthalmology;  Laterality: Left;   CATARACT EXTRACTION W/PHACO Right 07/14/2022   Procedure: CATARACT EXTRACTION PHACO AND INTRAOCULAR LENS PLACEMENT (IOC) RIGHT DIABETIC 13.38 01:16.9;  Surgeon: Mittie Gaskin, MD;  Location: [facility];  Service: Ophthalmology;  Laterality: Right;   CHOLECYSTECTOMY     COLONOSCOPY WITH PROPOFOL  N/A 01/31/2015   Procedure: COLONOSCOPY WITH PROPOFOL ;  Surgeon: Gladis RAYMOND Mariner, MD;  Location: [facility];  Service: Endoscopy;  Laterality: N/A;   TONSILLECTOMY     Patient Active Problem List   Diagnosis Date Noted   Acute upper respiratory infection 11/25/2019   Callus of foot 11/25/2019   Screening for osteoporosis 11/25/2019   Perennial allergic rhinitis 07/14/2019   Need for vaccination against Streptococcus pneumoniae using pneumococcal conjugate vaccine 13 07/14/2019   Encounter for general adult medical examination with abnormal findings 06/13/2019   Right shoulder pain  06/13/2019   Vitamin D  deficiency 06/13/2019   Gastroesophageal reflux disease without esophagitis 10/15/2018   Iron deficiency anemia 10/15/2018   Vitamin B12 deficiency 10/15/2018   Encounter for hepatitis C screening test for low risk patient 10/15/2018   Encounter for screening mammogram for malignant neoplasm of breast 10/15/2018   Lumbar disc disease with radiculopathy 05/22/2018   Cutaneous candidiasis 05/22/2018   Dysuria 05/22/2018   Acute pharyngitis 04/02/2018   Sore throat 04/02/2018   Pain in joint of right knee 01/29/2018   Chest pain 10/17/2017   SOB (shortness of breath) 10/17/2017   Vasomotor symptoms due to menopause 10/17/2017   Routine cervical smear 10/17/2017   Mixed hyperlipidemia 01/05/2017   Essential hypertension 12/17/2014   Obesity 12/17/2014   Obstructive sleep apnea 12/17/2014   Proteinuria 12/17/2014    PCP: Fernand Sigrid HERO, MD   REFERRING PROVIDER: Kip Lynwood Double, PA-C  REFERRING DIAG: F23.68 (ICD-10-CM) - Iliotibial band syndrome of right side G57.11 (ICD-10-CM) - Meralgia paresthetica of right side M70.61 (ICD-10-CM) - Trochanteric bursitis of right hip  Rationale for Evaluation and Treatment: Rehabilitation  THERAPY DIAG:  Pain in right thigh - Plan: PT plan of care cert/re-cert  Pain in right hip - Plan: PT plan of care cert/re-cert  Chronic pain of right knee - Plan: PT plan of care cert/re-cert  Difficulty in walking, not elsewhere classified - Plan: PT plan of care cert/re-cert  ONSET DATE: last year, gradual worsening, chronic condition  SUBJECTIVE:  SUBJECTIVE STATEMENT: R lateral thigh pain: 7/10 tingling, pt currently sitting on a chair; 10/10 at worst for the past 3 months.   PERTINENT HISTORY:  R lateral thigh pain. Pain starts right  above the knee and travels up the R lateral thigh (along the iliotibial band).  Pain began a while ago but gradually worsened last year. Has similar symptoms in her L hip. Feels like she picks up speed at times when she walks, and has a hard time stopping, loses balance, and falls.    Blood pressure is controlled with medication per pt.  No latex allergies  PAIN:  Are you having pain? Yes: NPRS scale: 7/10 Pain location: R lateral thigh to knee Pain description: tingling and numbness Aggravating factors: prolonged standing (such as making dinner or washing dishes), walking too far (2 minutes of walking), going up and down the stairs (has to go down stairs sideways) Relieving factors: Celebrex, rubbing her R thigh, propping her leg up on her recliner  PRECAUTIONS: None  RED FLAGS: Bowel or bladder incontinence: No and Cauda equina syndrome: No   WEIGHT BEARING RESTRICTIONS: No  FALLS:  Has patient fallen in last 6 months? No  LIVING ENVIRONMENT: Lives with: lives with their spouse Lives in: House/apartment Stairs: Yes: External: 3 steps; can reach both Has following equipment at home: None  OCCUPATION: Retired  PLOF: Independent  PATIENT GOALS: Stop the R thigh tingling.   NEXT MD VISIT: as needed  OBJECTIVE:  Note: Objective measures were completed at Evaluation unless otherwise noted.  DIAGNOSTIC FINDINGS:  DG Hip Unilat W OR W/O Pelvis 2-3 Views Left 10/19/2022  Narrative & Impression  CLINICAL DATA:  Status post fall with left lateral hip pain.   EXAM: DG HIP (WITH OR WITHOUT PELVIS) 2-3V LEFT   COMPARISON:  Radiograph 01/14/2017   FINDINGS: No evidence of acute fracture of the pelvis or left hip. No hip dislocation. Similar mild left hip joint space narrowing to prior exam. No significant osteophytes. Pubic rami are intact. No diastasis of pubic symphysis or sacroiliac joints. No erosive change or focal bone abnormalities. Unremarkable soft tissues    IMPRESSION: 1. No acute fracture or dislocation of the pelvis or left hip. 2. Similar mild left hip joint space narrowing to 2019 exam.     Electronically Signed   By: Andrea Gasman M.D.   On: 11/10/2022 16:34    PATIENT SURVEYS:  FOTO R hip FOTO 44  COGNITION: Overall cognitive status: Within functional limits for tasks assessed       POSTURE: B foot pronation R > L, R knee and iliac crest slightly higher, R lumbar convexity, movement preference around L4/L5, R trunk thoracic bend posture, R trunk rotation, R shoulder lower, slight upper thoracic kyphosis, forward neck, B protracted shoulders  Decreased R lateral thigh symptoims with PT manual pressure to decrease R trunk side bend and R lumbar convexity.    PALPATION: No TTP to low back. Slight decreased R UPA to R L5 TP. Decreased CPA to mid thoracic spine  TTP R greater trochanter, different from pain complain of  TTP R lateral thigh, reproduction of pain    LUMBAR ROM:   AROM eval  Flexion Full with R posterior lateral thigh discomfort around the L5/S1 dermatome  Extension WFL, movement preference around L4/L5 area, decreased R lateral thigh pain  Right lateral flexion WFL with low back discomfort, no R lateral thigh pain but has R knee joint pain.   Left lateral flexion WFL with  R lateral trunk pulling  Right rotation full  Left rotation Limited with L lateral hip discomfort   (Blank rows = not tested)  LOWER EXTREMITY ROM:     Passive  Right eval Left eval  Hip flexion    Hip extension    Hip abduction    Hip adduction    Hip internal rotation    Hip external rotation    Knee flexion    Knee extension    Ankle dorsiflexion    Ankle plantarflexion    Ankle inversion    Ankle eversion     (Blank rows = not tested)  LOWER EXTREMITY MMT:    MMT Right eval Left eval  Hip flexion 4 4-  Hip extension 4- 4-  Hip abduction 4- with R lateral thigh pain reproduction 4-  Hip adduction    Hip  internal rotation    Hip external rotation    Knee flexion 4 5  Knee extension 5 5  Ankle dorsiflexion    Ankle plantarflexion    Ankle inversion    Ankle eversion     (Blank rows = not tested)    LUMBAR SPECIAL TESTS:   (+) Ober test on R (leg straight) with reproduction of lateral thigh pain  FUNCTIONAL TESTS:    GAIT: Distance walked: 60 ft Assistive device utilized: None Level of assistance: Complete Independence Comments: antalgic, decreased stance R LE, R lateral lean, R foot pronation during R LE stance phase  TREATMENT DATE: 01/13/2023                                                                                                                                 PATIENT EDUCATION:  Education details: POC Person educated: Patient Education method: Explanation Education comprehension: verbalized understanding  HOME EXERCISE PROGRAM:   ASSESSMENT:  CLINICAL IMPRESSION: Patient is a 68 y.o. female who was seen today for physical therapy evaluation and treatment for R hip, lateral thigh, and knee pain. She also presents with altered gait pattern and posture, decreased R lateral thigh symptoms with lumbar extension as well as R lumbar side bending, R knee joint pain, positive special test suggesting iliotibial band involvement, trunk and B hip weakness, and difficulty performing tasks which involve prolonged standing as well as walking secondary to pain. Pt will benefit from skilled physical therapy services to address the aforementioned deficits.     OBJECTIVE IMPAIRMENTS: Abnormal gait, decreased activity tolerance, difficulty walking, decreased strength, improper body mechanics, postural dysfunction, and pain.   ACTIVITY LIMITATIONS: carrying, lifting, standing, stairs, reach over head, and locomotion level  PARTICIPATION LIMITATIONS:   PERSONAL FACTORS: Age, Fitness, Time since onset of injury/illness/exacerbation, and 3+ comorbidities: HTN, obesity, pre-diabetes   are also affecting patient's functional outcome.   REHAB POTENTIAL: Fair    CLINICAL DECISION MAKING: Evolving/moderate complexity Pain is gradually worsening based on subjective reports.   EVALUATION COMPLEXITY: Moderate   GOALS: Goals reviewed with patient?  Yes  SHORT TERM GOALS: Target date: 01/28/2023  Pt will be independent with her initial HEP to decrease pain, improve strength, function, and ability to ambulate and perform standing tasks more comfortably.  Baseline: Pt has not yet started her initial HEP (01/13/2023) Goal status: INITIAL   LONG TERM GOALS: Target date: 03/11/2023  Pt will have a decrease in R lateral thigh pain to 5/10 or less at worst to promote ability to ambulate as well as perform standing tasks more comfortably.  Baseline: 10/10 R thigh pain at worst for the past 3 months (01/13/2023) Goal status: INITIAL  2.  Pt will improve her R hip FOTO score by at least 10 points as a demonstration of improved function.  Baseline: R hip FOTO 44 (01/13/2023) Goal status: INITIAL  3.  Pt will improve her hip extension and abduction strength by at least 1/2 MMT grade to promote ability to ambulate, and perform standing tasks more comfortably.  Baseline:  MMT Right eval Left eval  Hip extension 4- 4-  Hip abduction 4- with R lateral thigh pain reproduction 4-  (01/13/2023)   Goal status: INITIAL   PLAN:  PT FREQUENCY: 1-2x/week  PT DURATION: 8 weeks  PLANNED INTERVENTIONS: 97110-Therapeutic exercises, 97530- Therapeutic activity, [id number]- Neuromuscular re-education, 97140- Manual therapy, [id number]- Gait training, 02985- Electrical stimulation (unattended), [id number]- Traction (mechanical), [id number]- Ionotophoresis 4mg /ml Dexamethasone, Patient/Family education, Dry Needling, Joint mobilization, Joint manipulation, Spinal manipulation, and Spinal mobilization.  PLAN FOR NEXT SESSION: improve posture, trunk and glute strength, femoral control, manual techniques, modalities  PRN   Yzabella Crunk, PT, DPT 01/13/2023, 11:05 AM

## 2023-01-17 ENCOUNTER — Ambulatory Visit: Payer: No Typology Code available for payment source

## 2023-01-17 DIAGNOSIS — R262 Difficulty in walking, not elsewhere classified: Secondary | ICD-10-CM

## 2023-01-17 DIAGNOSIS — M79651 Pain in right thigh: Secondary | ICD-10-CM

## 2023-01-17 DIAGNOSIS — G8929 Other chronic pain: Secondary | ICD-10-CM

## 2023-01-17 DIAGNOSIS — M25551 Pain in right hip: Secondary | ICD-10-CM

## 2023-01-17 NOTE — Therapy (Signed)
 OUTPATIENT PHYSICAL THERAPY TREATMENT   Patient Name: Jamie Powers MRN: [id number] DOB:Aug 22, 1955, 68 y.o., female Today's Date: 01/17/2023  END OF SESSION:  PT End of Session - 01/17/23 1036     Visit Number 2    Number of Visits 17    Date for PT Re-Evaluation 03/11/23    PT Start Time 1036    PT Stop Time 1116    PT Time Calculation (min) 40 min    Activity Tolerance Patient tolerated treatment well    Behavior During Therapy WFL for tasks assessed/performed              Past Medical History:  Diagnosis Date   GERD (gastroesophageal reflux disease)    Hypertension    Obesity    Pre-diabetes    Protein in urine    Past Surgical History:  Procedure Laterality Date   BREAST BIOPSY Left 2000   neg   BREAST SURGERY     CATARACT EXTRACTION W/PHACO Left 06/30/2022   Procedure: CATARACT EXTRACTION PHACO AND INTRAOCULAR LENS PLACEMENT (IOC) LEFT DIABETIC 10.40 01:05.1;  Surgeon: Mittie Gaskin, MD;  Location: [facility];  Service: Ophthalmology;  Laterality: Left;   CATARACT EXTRACTION W/PHACO Right 07/14/2022   Procedure: CATARACT EXTRACTION PHACO AND INTRAOCULAR LENS PLACEMENT (IOC) RIGHT DIABETIC 13.38 01:16.9;  Surgeon: Mittie Gaskin, MD;  Location: [facility];  Service: Ophthalmology;  Laterality: Right;   CHOLECYSTECTOMY     COLONOSCOPY WITH PROPOFOL  N/A 01/31/2015   Procedure: COLONOSCOPY WITH PROPOFOL ;  Surgeon: Gladis RAYMOND Mariner, MD;  Location: [facility];  Service: Endoscopy;  Laterality: N/A;   TONSILLECTOMY     Patient Active Problem List   Diagnosis Date Noted   Acute upper respiratory infection 11/25/2019   Callus of foot 11/25/2019   Screening for osteoporosis 11/25/2019   Perennial allergic rhinitis 07/14/2019   Need for vaccination against Streptococcus pneumoniae using pneumococcal conjugate vaccine 13 07/14/2019   Encounter for general adult medical examination with abnormal findings 06/13/2019   Right shoulder  pain 06/13/2019   Vitamin D  deficiency 06/13/2019   Gastroesophageal reflux disease without esophagitis 10/15/2018   Iron deficiency anemia 10/15/2018   Vitamin B12 deficiency 10/15/2018   Encounter for hepatitis C screening test for low risk patient 10/15/2018   Encounter for screening mammogram for malignant neoplasm of breast 10/15/2018   Lumbar disc disease with radiculopathy 05/22/2018   Cutaneous candidiasis 05/22/2018   Dysuria 05/22/2018   Acute pharyngitis 04/02/2018   Sore throat 04/02/2018   Pain in joint of right knee 01/29/2018   Chest pain 10/17/2017   SOB (shortness of breath) 10/17/2017   Vasomotor symptoms due to menopause 10/17/2017   Routine cervical smear 10/17/2017   Mixed hyperlipidemia 01/05/2017   Essential hypertension 12/17/2014   Obesity 12/17/2014   Obstructive sleep apnea 12/17/2014   Proteinuria 12/17/2014    PCP: Fernand Sigrid HERO, MD   REFERRING PROVIDER: Kip Lynwood Double, PA-C  REFERRING DIAG: F23.68 (ICD-10-CM) - Iliotibial band syndrome of right side G57.11 (ICD-10-CM) - Meralgia paresthetica of right side M70.61 (ICD-10-CM) - Trochanteric bursitis of right hip  Rationale for Evaluation and Treatment: Rehabilitation  THERAPY DIAG:  Pain in right thigh  Pain in right hip  Chronic pain of right knee  Difficulty in walking, not elsewhere classified  ONSET DATE: last year, gradual worsening, chronic condition  SUBJECTIVE:  SUBJECTIVE STATEMENT: R lateral thigh is a little better. No pain currently. Had some pain there yesterday when she was on her feet.    PERTINENT HISTORY:  R lateral thigh pain. Pain starts right above the knee and travels up the R lateral thigh (along the iliotibial band).  Pain began a while ago but gradually worsened last year. Has  similar symptoms in her L hip. Feels like she picks up speed at times when she walks, and has a hard time stopping, loses balance, and falls.    Blood pressure is controlled with medication per pt.  No latex allergies  PAIN:  Are you having pain? Yes: NPRS scale: 7/10 Pain location: R lateral thigh to knee Pain description: tingling and numbness Aggravating factors: prolonged standing (such as making dinner or washing dishes), walking too far (2 minutes of walking), going up and down the stairs (has to go down stairs sideways) Relieving factors: Celebrex, rubbing her R thigh, propping her leg up on her recliner  PRECAUTIONS: None  RED FLAGS: Bowel or bladder incontinence: No and Cauda equina syndrome: No   WEIGHT BEARING RESTRICTIONS: No  FALLS:  Has patient fallen in last 6 months? No  LIVING ENVIRONMENT: Lives with: lives with their spouse Lives in: House/apartment Stairs: Yes: External: 3 steps; can reach both Has following equipment at home: None  OCCUPATION: Retired  PLOF: Independent  PATIENT GOALS: Stop the R thigh tingling.   NEXT MD VISIT: as needed  OBJECTIVE:  Note: Objective measures were completed at Evaluation unless otherwise noted.  DIAGNOSTIC FINDINGS:  DG Hip Unilat W OR W/O Pelvis 2-3 Views Left 10/19/2022  Narrative & Impression  CLINICAL DATA:  Status post fall with left lateral hip pain.   EXAM: DG HIP (WITH OR WITHOUT PELVIS) 2-3V LEFT   COMPARISON:  Radiograph 01/14/2017   FINDINGS: No evidence of acute fracture of the pelvis or left hip. No hip dislocation. Similar mild left hip joint space narrowing to prior exam. No significant osteophytes. Pubic rami are intact. No diastasis of pubic symphysis or sacroiliac joints. No erosive change or focal bone abnormalities. Unremarkable soft tissues   IMPRESSION: 1. No acute fracture or dislocation of the pelvis or left hip. 2. Similar mild left hip joint space narrowing to 2019 exam.      Electronically Signed   By: Andrea Gasman M.D.   On: 11/10/2022 16:34    PATIENT SURVEYS:  FOTO R hip FOTO 44  COGNITION: Overall cognitive status: Within functional limits for tasks assessed       POSTURE: B foot pronation R > L, R knee and iliac crest slightly higher, R lumbar convexity, movement preference around L4/L5, R trunk thoracic bend posture, R trunk rotation, R shoulder lower, slight upper thoracic kyphosis, forward neck, B protracted shoulders  Decreased R lateral thigh symptoims with PT manual pressure to decrease R trunk side bend and R lumbar convexity.    PALPATION: No TTP to low back. Slight decreased R UPA to R L5 TP. Decreased CPA to mid thoracic spine  TTP R greater trochanter, different from pain complain of  TTP R lateral thigh, reproduction of pain    LUMBAR ROM:   AROM eval  Flexion Full with R posterior lateral thigh discomfort around the L5/S1 dermatome  Extension WFL, movement preference around L4/L5 area, decreased R lateral thigh pain  Right lateral flexion WFL with low back discomfort, no R lateral thigh pain but has R knee joint pain.   Left lateral flexion  WFL with R lateral trunk pulling  Right rotation full  Left rotation Limited with L lateral hip discomfort   (Blank rows = not tested)  LOWER EXTREMITY ROM:     Passive  Right eval Left eval  Hip flexion    Hip extension    Hip abduction    Hip adduction    Hip internal rotation    Hip external rotation    Knee flexion    Knee extension    Ankle dorsiflexion    Ankle plantarflexion    Ankle inversion    Ankle eversion     (Blank rows = not tested)  LOWER EXTREMITY MMT:    MMT Right eval Left eval  Hip flexion 4 4-  Hip extension 4- 4-  Hip abduction 4- with R lateral thigh pain reproduction 4-  Hip adduction    Hip internal rotation    Hip external rotation    Knee flexion 4 5  Knee extension 5 5  Ankle dorsiflexion    Ankle plantarflexion    Ankle  inversion    Ankle eversion     (Blank rows = not tested)    LUMBAR SPECIAL TESTS:   (+) Ober test on R (leg straight) with reproduction of lateral thigh pain  FUNCTIONAL TESTS:    GAIT: Distance walked: 60 ft Assistive device utilized: None Level of assistance: Complete Independence Comments: antalgic, decreased stance R LE, R lateral lean, R foot pronation during R LE stance phase  TREATMENT DATE: 01/17/2023                                                                                                                               Therapeutic exercise  Seated with lumbar towel roll   With trunk extension 10x5 seconds for 3 sets  Standing bent over onto elevated mat table   Hip extension    R 10x, then 5x   L knee joint discomfort from weight bearing.   Single leg deadlift with contralateral UE assist   R 10x  R knee joint discomfort  Static mini lunge with contralateral UE assist   R 3x  R knee joint discomfort   Seated hip ER   R 10x5 seconds    R lateral thigh discomfort  Seated hip adduction isometrics 10x5 seconds  Hooklying hip extension isometrics, leg straight to promote glute max strength in non-weight bearing to knee joints.   R 10x5 seconds for 3 sets  L 10x5 seconds for 3 sets     Improved exercise technique, movement at target joints, use of target muscles after mod verbal, visual, tactile cues.      PATIENT EDUCATION:  Education details: Ther-ex Person educated: Patient Education method: Explanation Education comprehension: verbalized understanding  HOME EXERCISE PROGRAM:  Sitting with lumbar towel roll    ASSESSMENT:  CLINICAL IMPRESSION:  Worked on gentle lumbar extension with lumbar towel roll secondary to extension directional preference for R lateral  thigh pain. Worked on glute strengthening to promote better mechanics to low back and R knee. Difficulty with weight bearing exercises secondary to R and L knee joint pain. Fair  tolerance to today's session. Pt will benefit from continued skilled physical therapy services to decrease pain, improve strength and function.      OBJECTIVE IMPAIRMENTS: Abnormal gait, decreased activity tolerance, difficulty walking, decreased strength, improper body mechanics, postural dysfunction, and pain.   ACTIVITY LIMITATIONS: carrying, lifting, standing, stairs, reach over head, and locomotion level  PARTICIPATION LIMITATIONS:   PERSONAL FACTORS: Age, Fitness, Time since onset of injury/illness/exacerbation, and 3+ comorbidities: HTN, obesity, pre-diabetes  are also affecting patient's functional outcome.   REHAB POTENTIAL: Fair    CLINICAL DECISION MAKING: Evolving/moderate complexity Pain is gradually worsening based on subjective reports.   EVALUATION COMPLEXITY: Moderate   GOALS: Goals reviewed with patient? Yes  SHORT TERM GOALS: Target date: 01/28/2023  Pt will be independent with her initial HEP to decrease pain, improve strength, function, and ability to ambulate and perform standing tasks more comfortably.  Baseline: Pt has not yet started her initial HEP (01/13/2023) Goal status: INITIAL   LONG TERM GOALS: Target date: 03/11/2023  Pt will have a decrease in R lateral thigh pain to 5/10 or less at worst to promote ability to ambulate as well as perform standing tasks more comfortably.  Baseline: 10/10 R thigh pain at worst for the past 3 months (01/13/2023) Goal status: INITIAL  2.  Pt will improve her R hip FOTO score by at least 10 points as a demonstration of improved function.  Baseline: R hip FOTO 44 (01/13/2023) Goal status: INITIAL  3.  Pt will improve her hip extension and abduction strength by at least 1/2 MMT grade to promote ability to ambulate, and perform standing tasks more comfortably.  Baseline:  MMT Right eval Left eval  Hip extension 4- 4-  Hip abduction 4- with R lateral thigh pain reproduction 4-  (01/13/2023)   Goal status:  INITIAL   PLAN:  PT FREQUENCY: 1-2x/week  PT DURATION: 8 weeks  PLANNED INTERVENTIONS: 97110-Therapeutic exercises, 97530- Therapeutic activity, [id number]- Neuromuscular re-education, 97140- Manual therapy, [id number]- Gait training, 02985- Electrical stimulation (unattended), [id number]- Traction (mechanical), [id number]- Ionotophoresis 4mg /ml Dexamethasone, Patient/Family education, Dry Needling, Joint mobilization, Joint manipulation, Spinal manipulation, and Spinal mobilization.  PLAN FOR NEXT SESSION: improve posture, trunk and glute strength, femoral control, manual techniques, modalities PRN   Jordy Hewins, PT, DPT 01/17/2023, 12:15 PM

## 2023-01-21 ENCOUNTER — Ambulatory Visit: Payer: No Typology Code available for payment source

## 2023-01-21 DIAGNOSIS — M79651 Pain in right thigh: Secondary | ICD-10-CM

## 2023-01-21 DIAGNOSIS — R262 Difficulty in walking, not elsewhere classified: Secondary | ICD-10-CM

## 2023-01-21 DIAGNOSIS — G8929 Other chronic pain: Secondary | ICD-10-CM

## 2023-01-21 DIAGNOSIS — M25551 Pain in right hip: Secondary | ICD-10-CM

## 2023-01-21 NOTE — Therapy (Signed)
OUTPATIENT PHYSICAL THERAPY TREATMENT   Patient Name: Jamie Powers MRN: [id number] DOB:Jan 03, 1956, 68 y.o., female Today's Date: 01/21/2023  END OF SESSION:  PT End of Session - 01/21/23 0821     Visit Number 3    Number of Visits 17    Date for PT Re-Evaluation 03/11/23    PT Start Time 0821    PT Stop Time 0908    PT Time Calculation (min) 47 min    Activity Tolerance Patient tolerated treatment well    Behavior During Therapy WFL for tasks assessed/performed               Past Medical History:  Diagnosis Date   GERD (gastroesophageal reflux disease)    Hypertension    Obesity    Pre-diabetes    Protein in urine    Past Surgical History:  Procedure Laterality Date   BREAST BIOPSY Left 2000   neg   BREAST SURGERY     CATARACT EXTRACTION W/PHACO Left 06/30/2022   Procedure: CATARACT EXTRACTION PHACO AND INTRAOCULAR LENS PLACEMENT (IOC) LEFT DIABETIC 10.40 01:05.1;  Surgeon: Lockie Mola, MD;  Location: [facility];  Service: Ophthalmology;  Laterality: Left;   CATARACT EXTRACTION W/PHACO Right 07/14/2022   Procedure: CATARACT EXTRACTION PHACO AND INTRAOCULAR LENS PLACEMENT (IOC) RIGHT DIABETIC 13.38 01:16.9;  Surgeon: Lockie Mola, MD;  Location: [facility];  Service: Ophthalmology;  Laterality: Right;   CHOLECYSTECTOMY     COLONOSCOPY WITH PROPOFOL N/A 01/31/2015   Procedure: COLONOSCOPY WITH PROPOFOL;  Surgeon: Christena Deem, MD;  Location: [facility];  Service: Endoscopy;  Laterality: N/A;   TONSILLECTOMY     Patient Active Problem List   Diagnosis Date Noted   Acute upper respiratory infection 11/25/2019   Callus of foot 11/25/2019   Screening for osteoporosis 11/25/2019   Perennial allergic rhinitis 07/14/2019   Need for vaccination against Streptococcus pneumoniae using pneumococcal conjugate vaccine 13 07/14/2019   Encounter for general adult medical examination with abnormal findings 06/13/2019   Right shoulder  pain 06/13/2019   Vitamin D deficiency 06/13/2019   Gastroesophageal reflux disease without esophagitis 10/15/2018   Iron deficiency anemia 10/15/2018   Vitamin B12 deficiency 10/15/2018   Encounter for hepatitis C screening test for low risk patient 10/15/2018   Encounter for screening mammogram for malignant neoplasm of breast 10/15/2018   Lumbar disc disease with radiculopathy 05/22/2018   Cutaneous candidiasis 05/22/2018   Dysuria 05/22/2018   Acute pharyngitis 04/02/2018   Sore throat 04/02/2018   Pain in joint of right knee 01/29/2018   Chest pain 10/17/2017   SOB (shortness of breath) 10/17/2017   Vasomotor symptoms due to menopause 10/17/2017   Routine cervical smear 10/17/2017   Mixed hyperlipidemia 01/05/2017   Essential hypertension 12/17/2014   Obesity 12/17/2014   Obstructive sleep apnea 12/17/2014   Proteinuria 12/17/2014    PCP: Lyndon Code, MD   REFERRING PROVIDER: Anson [place], PA-C  REFERRING DIAG: W29.56 (ICD-10-CM) - Iliotibial band syndrome of right side G57.11 (ICD-10-CM) - Meralgia paresthetica of right side M70.61 (ICD-10-CM) - Trochanteric bursitis of right hip  Rationale for Evaluation and Treatment: Rehabilitation  THERAPY DIAG:  Pain in right thigh  Pain in right hip  Chronic pain of right knee  Difficulty in walking, not elsewhere classified  ONSET DATE: last year, gradual worsening, chronic condition  SUBJECTIVE:  SUBJECTIVE STATEMENT: R lateral thigh is good. Has not pain at all.  Has been able to do lots of stuff without hurting.    PERTINENT HISTORY:  R lateral thigh pain. Pain starts right above the knee and travels up the R lateral thigh (along the iliotibial band).  Pain began a while ago but gradually worsened last year. Has similar  symptoms in her L hip. Feels like she picks up speed at times when she walks, and has a hard time stopping, loses balance, and falls.    Blood pressure is controlled with medication per pt.  No latex allergies  PAIN:  Are you having pain? Yes: NPRS scale: 7/10 Pain location: R lateral thigh to knee Pain description: tingling and numbness Aggravating factors: prolonged standing (such as making dinner or washing dishes), walking too far (2 minutes of walking), going up and down the stairs (has to go down stairs sideways) Relieving factors: Celebrex, rubbing her R thigh, propping her leg up on her recliner  PRECAUTIONS: None  RED FLAGS: Bowel or bladder incontinence: No and Cauda equina syndrome: No   WEIGHT BEARING RESTRICTIONS: No  FALLS:  Has patient fallen in last 6 months? No  LIVING ENVIRONMENT: Lives with: lives with their spouse Lives in: House/apartment Stairs: Yes: External: 3 steps; can reach both Has following equipment at home: None  OCCUPATION: Retired  PLOF: Independent  PATIENT GOALS: Stop the R thigh tingling.   NEXT MD VISIT: as needed  OBJECTIVE:  Note: Objective measures were completed at Evaluation unless otherwise noted.  DIAGNOSTIC FINDINGS:  DG Hip Unilat W OR W/O Pelvis 2-3 Views Left 10/19/2022  Narrative & Impression  CLINICAL DATA:  Status post fall with left lateral hip pain.   EXAM: DG HIP (WITH OR WITHOUT PELVIS) 2-3V LEFT   COMPARISON:  Radiograph 01/14/2017   FINDINGS: No evidence of acute fracture of the pelvis or left hip. No hip dislocation. Similar mild left hip joint space narrowing to prior exam. No significant osteophytes. Pubic rami are intact. No diastasis of pubic symphysis or sacroiliac joints. No erosive change or focal bone abnormalities. Unremarkable soft tissues   IMPRESSION: 1. No acute fracture or dislocation of the pelvis or left hip. 2. Similar mild left hip joint space narrowing to 2019 exam.      Electronically Signed   By: Narda Rutherford M.D.   On: 11/10/2022 16:34    PATIENT SURVEYS:  FOTO R hip FOTO 44  COGNITION: Overall cognitive status: Within functional limits for tasks assessed       POSTURE: B foot pronation R > L, R knee and iliac crest slightly higher, R lumbar convexity, movement preference around L4/L5, R trunk thoracic bend posture, R trunk rotation, R shoulder lower, slight upper thoracic kyphosis, forward neck, B protracted shoulders  Decreased R lateral thigh symptoims with PT manual pressure to decrease R trunk side bend and R lumbar convexity.    PALPATION: No TTP to low back. Slight decreased R UPA to R L5 TP. Decreased CPA to mid thoracic spine  TTP R greater trochanter, different from pain complain of  TTP R lateral thigh, reproduction of pain    LUMBAR ROM:   AROM eval  Flexion Full with R posterior lateral thigh discomfort around the L5/S1 dermatome  Extension WFL, movement preference around L4/L5 area, decreased R lateral thigh pain  Right lateral flexion WFL with low back discomfort, no R lateral thigh pain but has R knee joint pain.   Left lateral flexion  WFL with R lateral trunk pulling  Right rotation full  Left rotation Limited with L lateral hip discomfort   (Blank rows = not tested)  LOWER EXTREMITY ROM:     Passive  Right eval Left eval  Hip flexion    Hip extension    Hip abduction    Hip adduction    Hip internal rotation    Hip external rotation    Knee flexion    Knee extension    Ankle dorsiflexion    Ankle plantarflexion    Ankle inversion    Ankle eversion     (Blank rows = not tested)  LOWER EXTREMITY MMT:    MMT Right eval Left eval  Hip flexion 4 4-  Hip extension 4- 4-  Hip abduction 4- with R lateral thigh pain reproduction 4-  Hip adduction    Hip internal rotation    Hip external rotation    Knee flexion 4 5  Knee extension 5 5  Ankle dorsiflexion    Ankle plantarflexion    Ankle  inversion    Ankle eversion     (Blank rows = not tested)    LUMBAR SPECIAL TESTS:   (+) Ober test on R (leg straight) with reproduction of lateral thigh pain  FUNCTIONAL TESTS:    GAIT: Distance walked: 60 ft Assistive device utilized: None Level of assistance: Complete Independence Comments: antalgic, decreased stance R LE, R lateral lean, R foot pronation during R LE stance phase  TREATMENT DATE: 01/21/2023                                                                                                                               Therapeutic exercise  Seated with lumbar towel roll   With trunk extension 10x5 seconds for 3 sets  Seated B scapular retraction to promote thoracic extension 10x5 seconds for 3 sets  Seated hip adduction isometrics 10x5 seconds for 2 sets  Reclined  Hooklying    hip extension isometrics, leg straight to promote glute max strength in non-weight bearing to knee joints.     R 10x5 seconds for 3 sets    L 10x5 seconds for 3 sets  Hooklying transversus abdominis contraction 10x3 with 5 secon holds  Reviewed HEP. Pt demonstrated and verbalized understanding. Handout provided.   Reviewed POC: possible graduation with PT next week it pt continues to maintain good progress.   Pt states dizziness when sitting up. Forgot to take her medicine this morning.  Blood pressure, L arm sitting, mechanically taken, normal cuff: 132/78, HR 76  Pt states feeling ok now. Not dizzy.   Improved exercise technique, movement at target joints, use of target muscles after mod verbal, visual, tactile cues.      PATIENT EDUCATION:  Education details: Ther-ex, HEP Person educated: Patient Education method: Explanation, Demonstration, Tactile cues, Verbal cues, and Handouts Education comprehension: verbalized understanding and returned demonstration  HOME EXERCISE PROGRAM:  Sitting with  lumbar towel roll  Access Code: [email or web address] URL:  [email or web address] Date: 01/21/2023 Prepared by: Loralyn Freshwater  Exercises - Seated Scapular Retraction  - 1 x daily - 7 x weekly - 3 sets - 10 reps - 5 seconds hold - Seated Hip Adduction Isometrics with Ball  - 1 x daily - 7 x weekly - 2-3 sets - 10 reps - 5 seconds hold   Reclined  Hooklying    hip extension isometrics, leg straight to promote glute max strength in non-weight bearing to knee joints.     R 10x5 seconds for 3 sets    L 10x5 seconds for 3 sets  - Supine Transversus Abdominis Bracing - Hands on Stomach  - 5 x daily - 7 x weekly - 3 sets - 10 reps - 5 seconds hold   ASSESSMENT:  CLINICAL IMPRESSION:  Pt making very good progress with decreased pain based on subjective reports. Continued working on gentle lumbar extension with lumbar towel roll secondary to extension directional preference for R lateral thigh pain. Worked on trunk and glute strengthening to promote better mechanics to low back and R knee. Worked on thoracic extension to decrease stress to low back. Pt tolerated session well without aggravation of symptoms.  Pt will benefit from continued skilled physical therapy services to decrease pain, improve strength and function.      OBJECTIVE IMPAIRMENTS: Abnormal gait, decreased activity tolerance, difficulty walking, decreased strength, improper body mechanics, postural dysfunction, and pain.   ACTIVITY LIMITATIONS: carrying, lifting, standing, stairs, reach over head, and locomotion level  PARTICIPATION LIMITATIONS:   PERSONAL FACTORS: Age, Fitness, Time since onset of injury/illness/exacerbation, and 3+ comorbidities: HTN, obesity, pre-diabetes  are also affecting patient's functional outcome.   REHAB POTENTIAL: Fair    CLINICAL DECISION MAKING: Evolving/moderate complexity Pain is gradually worsening based on subjective reports.   EVALUATION COMPLEXITY: Moderate   GOALS: Goals reviewed with patient? Yes  SHORT TERM GOALS: Target  date: 01/28/2023  Pt will be independent with her initial HEP to decrease pain, improve strength, function, and ability to ambulate and perform standing tasks more comfortably.  Baseline: Pt has not yet started her initial HEP (01/13/2023) Goal status: INITIAL   LONG TERM GOALS: Target date: 03/11/2023  Pt will have a decrease in R lateral thigh pain to 5/10 or less at worst to promote ability to ambulate as well as perform standing tasks more comfortably.  Baseline: 10/10 R thigh pain at worst for the past 3 months (01/13/2023) Goal status: INITIAL  2.  Pt will improve her R hip FOTO score by at least 10 points as a demonstration of improved function.  Baseline: R hip FOTO 44 (01/13/2023) Goal status: INITIAL  3.  Pt will improve her hip extension and abduction strength by at least 1/2 MMT grade to promote ability to ambulate, and perform standing tasks more comfortably.  Baseline:  MMT Right eval Left eval  Hip extension 4- 4-  Hip abduction 4- with R lateral thigh pain reproduction 4-  (01/13/2023)   Goal status: INITIAL   PLAN:  PT FREQUENCY: 1-2x/week  PT DURATION: 8 weeks  PLANNED INTERVENTIONS: 97110-Therapeutic exercises, 97530- Therapeutic activity, [id number]- Neuromuscular re-education, 97140- Manual therapy, [id number]- Gait training, 08657- Electrical stimulation (unattended), [id number]- Traction (mechanical), [id number]- Ionotophoresis 4mg /ml Dexamethasone, Patient/Family education, Dry Needling, Joint mobilization, Joint manipulation, Spinal manipulation, and Spinal mobilization.  PLAN FOR NEXT SESSION: improve posture, trunk and glute strength, femoral control, manual techniques, modalities PRN   Kambra Beachem, PT, DPT 01/21/2023, 10:01 AM

## 2023-01-24 ENCOUNTER — Ambulatory Visit: Payer: No Typology Code available for payment source

## 2023-01-24 DIAGNOSIS — G8929 Other chronic pain: Secondary | ICD-10-CM

## 2023-01-24 DIAGNOSIS — M79651 Pain in right thigh: Secondary | ICD-10-CM

## 2023-01-24 DIAGNOSIS — R262 Difficulty in walking, not elsewhere classified: Secondary | ICD-10-CM

## 2023-01-24 DIAGNOSIS — M25551 Pain in right hip: Secondary | ICD-10-CM

## 2023-01-24 NOTE — Therapy (Signed)
OUTPATIENT PHYSICAL THERAPY TREATMENT And Discharge Summary   Patient Name: Jamie Powers MRN: [id number] DOB:08-04-1955, 68 y.o., female Today's Date: 01/24/2023  END OF SESSION:  PT End of Session - 01/24/23 0736     Visit Number 4    Number of Visits 17    Date for PT Re-Evaluation 03/11/23    PT Start Time 0736    PT Stop Time 0813    PT Time Calculation (min) 37 min    Activity Tolerance Patient tolerated treatment well    Behavior During Therapy WFL for tasks assessed/performed                Past Medical History:  Diagnosis Date   GERD (gastroesophageal reflux disease)    Hypertension    Obesity    Pre-diabetes    Protein in urine    Past Surgical History:  Procedure Laterality Date   BREAST BIOPSY Left 2000   neg   BREAST SURGERY     CATARACT EXTRACTION W/PHACO Left 06/30/2022   Procedure: CATARACT EXTRACTION PHACO AND INTRAOCULAR LENS PLACEMENT (IOC) LEFT DIABETIC 10.40 01:05.1;  Surgeon: Lockie Mola, MD;  Location: [facility];  Service: Ophthalmology;  Laterality: Left;   CATARACT EXTRACTION W/PHACO Right 07/14/2022   Procedure: CATARACT EXTRACTION PHACO AND INTRAOCULAR LENS PLACEMENT (IOC) RIGHT DIABETIC 13.38 01:16.9;  Surgeon: Lockie Mola, MD;  Location: [facility];  Service: Ophthalmology;  Laterality: Right;   CHOLECYSTECTOMY     COLONOSCOPY WITH PROPOFOL N/A 01/31/2015   Procedure: COLONOSCOPY WITH PROPOFOL;  Surgeon: Christena Deem, MD;  Location: [facility] END[facility];  Service: Endoscopy;  Laterality: N/A;   TONSILLECTOMY     Patient Active Problem List   Diagnosis Date Noted   Acute upper respiratory infection 11/25/2019   Callus of foot 11/25/2019   Screening for osteoporosis 11/25/2019   Perennial allergic rhinitis 07/14/2019   Need for vaccination against Streptococcus pneumoniae using pneumococcal conjugate vaccine 13 07/14/2019   Encounter for general adult medical examination with abnormal findings  06/13/2019   Right shoulder pain 06/13/2019   Vitamin D deficiency 06/13/2019   Gastroesophageal reflux disease without esophagitis 10/15/2018   Iron deficiency anemia 10/15/2018   Vitamin B12 deficiency 10/15/2018   Encounter for hepatitis C screening test for low risk patient 10/15/2018   Encounter for screening mammogram for malignant neoplasm of breast 10/15/2018   Lumbar disc disease with radiculopathy 05/22/2018   Cutaneous candidiasis 05/22/2018   Dysuria 05/22/2018   Acute pharyngitis 04/02/2018   Sore throat 04/02/2018   Pain in joint of right knee 01/29/2018   Chest pain 10/17/2017   SOB (shortness of breath) 10/17/2017   Vasomotor symptoms due to menopause 10/17/2017   Routine cervical smear 10/17/2017   Mixed hyperlipidemia 01/05/2017   Essential hypertension 12/17/2014   Obesity 12/17/2014   Obstructive sleep apnea 12/17/2014   Proteinuria 12/17/2014    PCP: Lyndon Code, MD   REFERRING PROVIDER: Anson Oregon, PA-C  REFERRING DIAG: W09.81 (ICD-10-CM) - Iliotibial band syndrome of right side G57.11 (ICD-10-CM) - Meralgia paresthetica of right side M70.61 (ICD-10-CM) - Trochanteric bursitis of right hip  Rationale for Evaluation and Treatment: Rehabilitation  THERAPY DIAG:  Pain in right thigh  Pain in right hip  Chronic pain of right knee  Difficulty in walking, not elsewhere classified  ONSET DATE: last year, gradual worsening, chronic condition  SUBJECTIVE:  SUBJECTIVE STATEMENT: R lateral thigh is good. Stood on her feet yesterday, had no pain or symptoms. 1/10 at most for the past 7 days, even when she was standing and cooking or standing in church. Ready to graduate PT today. Will continue her HEP. Just has little muscle knots at her anterior lateral  thigh    PERTINENT HISTORY:  R lateral thigh pain. Pain starts right above the knee and travels up the R lateral thigh (along the iliotibial band).  Pain began a while ago but gradually worsened last year. Has similar symptoms in her L hip. Feels like she picks up speed at times when she walks, and has a hard time stopping, loses balance, and falls.    Blood pressure is controlled with medication per pt.  No latex allergies  PAIN:  Are you having pain? Yes: NPRS scale: 7/10 Pain location: R lateral thigh to knee Pain description: tingling and numbness Aggravating factors: prolonged standing (such as making dinner or washing dishes), walking too far (2 minutes of walking), going up and down the stairs (has to go down stairs sideways) Relieving factors: Celebrex, rubbing her R thigh, propping her leg up on her recliner  PRECAUTIONS: None  RED FLAGS: Bowel or bladder incontinence: No and Cauda equina syndrome: No   WEIGHT BEARING RESTRICTIONS: No  FALLS:  Has patient fallen in last 6 months? No  LIVING ENVIRONMENT: Lives with: lives with their spouse Lives in: House/apartment Stairs: Yes: External: 3 steps; can reach both Has following equipment at home: None  OCCUPATION: Retired  PLOF: Independent  PATIENT GOALS: Stop the R thigh tingling.   NEXT MD VISIT: as needed  OBJECTIVE:  Note: Objective measures were completed at Evaluation unless otherwise noted.  DIAGNOSTIC FINDINGS:  DG Hip Unilat W OR W/O Pelvis 2-3 Views Left 10/19/2022  Narrative & Impression  CLINICAL DATA:  Status post fall with left lateral hip pain.   EXAM: DG HIP (WITH OR WITHOUT PELVIS) 2-3V LEFT   COMPARISON:  Radiograph 01/14/2017   FINDINGS: No evidence of acute fracture of the pelvis or left hip. No hip dislocation. Similar mild left hip joint space narrowing to prior exam. No significant osteophytes. Pubic rami are intact. No diastasis of pubic symphysis or sacroiliac joints. No  erosive change or focal bone abnormalities. Unremarkable soft tissues   IMPRESSION: 1. No acute fracture or dislocation of the pelvis or left hip. 2. Similar mild left hip joint space narrowing to 2019 exam.     Electronically Signed   By: Narda Rutherford M.D.   On: 11/10/2022 16:34    PATIENT SURVEYS:  FOTO R hip FOTO 44  COGNITION: Overall cognitive status: Within functional limits for tasks assessed       POSTURE: B foot pronation R > L, R knee and iliac crest slightly higher, R lumbar convexity, movement preference around L4/L5, R trunk thoracic bend posture, R trunk rotation, R shoulder lower, slight upper thoracic kyphosis, forward neck, B protracted shoulders  Decreased R lateral thigh symptoims with PT manual pressure to decrease R trunk side bend and R lumbar convexity.    PALPATION: No TTP to low back. Slight decreased R UPA to R L5 TP. Decreased CPA to mid thoracic spine  TTP R greater trochanter, different from pain complain of  TTP R lateral thigh, reproduction of pain    LUMBAR ROM:   AROM eval  Flexion Full with R posterior lateral thigh discomfort around the L5/S1 dermatome  Extension WFL, movement preference around  L4/L5 area, decreased R lateral thigh pain  Right lateral flexion WFL with low back discomfort, no R lateral thigh pain but has R knee joint pain.   Left lateral flexion WFL with R lateral trunk pulling  Right rotation full  Left rotation Limited with L lateral hip discomfort   (Blank rows = not tested)  LOWER EXTREMITY ROM:     Passive  Right eval Left eval  Hip flexion    Hip extension    Hip abduction    Hip adduction    Hip internal rotation    Hip external rotation    Knee flexion    Knee extension    Ankle dorsiflexion    Ankle plantarflexion    Ankle inversion    Ankle eversion     (Blank rows = not tested)  LOWER EXTREMITY MMT:    MMT Right eval Left eval  Hip flexion 4 4-  Hip extension 4- 4-  Hip abduction  4- with R lateral thigh pain reproduction 4-  Hip adduction    Hip internal rotation    Hip external rotation    Knee flexion 4 5  Knee extension 5 5  Ankle dorsiflexion    Ankle plantarflexion    Ankle inversion    Ankle eversion     (Blank rows = not tested)    LUMBAR SPECIAL TESTS:   (+) Ober test on R (leg straight) with reproduction of lateral thigh pain  FUNCTIONAL TESTS:    GAIT: Distance walked: 60 ft Assistive device utilized: None Level of assistance: Complete Independence Comments: antalgic, decreased stance R LE, R lateral lean, R foot pronation during R LE stance phase  TREATMENT DATE: 01/24/2023                                                                                                                                Manual therapy  Supine with leg straight, STM R vastus lateralis to decrease muscle tension    Therapeutic exercise  Standing TFL stretch R. R knee discomfort.   Seated R knee flexion stretch. Unable to feel stretch   Supine knee flexion stretch. Unable to feel stretch   Hooklying hip extension isometrics, leg straight   R 10x5 seconds  L 10x5 seconds   Seated R knee flexion green band 10x3  Reviewed and given as part of her HEP. Pt demonstrated and verbalized understanding. Handout provided.    Improved exercise technique, movement at target joints, use of target muscles after mod verbal, visual, tactile cues.      PATIENT EDUCATION:  Education details: Ther-ex, HEP Person educated: Patient Education method: Explanation, Demonstration, Tactile cues, Verbal cues, and Handouts Education comprehension: verbalized understanding and returned demonstration  HOME EXERCISE PROGRAM:  Sitting with lumbar towel roll  Access Code: [email or web address] URL: [email or web address] Date: 01/21/2023 Prepared by: Loralyn Freshwater  Exercises - Seated Scapular Retraction  - 1 x daily - 7 x weekly - 3  sets - 10 reps - 5 seconds hold -  Seated Hip Adduction Isometrics with Ball  - 1 x daily - 7 x weekly - 2-3 sets - 10 reps - 5 seconds hold   Reclined  Hooklying    hip extension isometrics, leg straight to promote glute max strength in non-weight bearing to knee joints.     R 10x5 seconds for 3 sets    L 10x5 seconds for 3 sets  - Supine Transversus Abdominis Bracing - Hands on Stomach  - 5 x daily - 7 x weekly - 3 sets - 10 reps - 5 seconds hold - Seated Hamstring Curl with Anchored Resistance  - 1 x daily - 7 x weekly - 3 sets - 10 reps     ASSESSMENT:  CLINICAL IMPRESSION:  Pt demonstrates significant decrease in R LE pain as well as improved function since initial evaluation. Pt has made very good progress with PT towards goal and demonstrates independence with her HEP. Skilled physical therapy services with pt continuing her progress with her exercises at home.        OBJECTIVE IMPAIRMENTS: Abnormal gait, decreased activity tolerance, difficulty walking, decreased strength, improper body mechanics, postural dysfunction, and pain.   ACTIVITY LIMITATIONS: carrying, lifting, standing, stairs, reach over head, and locomotion level  PARTICIPATION LIMITATIONS:   PERSONAL FACTORS: Age, Fitness, Time since onset of injury/illness/exacerbation, and 3+ comorbidities: HTN, obesity, pre-diabetes  are also affecting patient's functional outcome.   REHAB POTENTIAL: Fair    CLINICAL DECISION MAKING: Evolving/moderate complexity Pain is gradually worsening based on subjective reports.   EVALUATION COMPLEXITY: Moderate   GOALS: Goals reviewed with patient? Yes  SHORT TERM GOALS: Target date: 01/28/2023  Pt will be independent with her initial HEP to decrease pain, improve strength, function, and ability to ambulate and perform standing tasks more comfortably.  Baseline: Pt has not yet started her initial HEP (01/13/2023); Pt performing her HEP, no questions. (01/24/2023) Goal status: MET   LONG TERM GOALS: Target  date: 03/11/2023  Pt will have a decrease in R lateral thigh pain to 5/10 or less at worst to promote ability to ambulate as well as perform standing tasks more comfortably.  Baseline: 10/10 R thigh pain at worst for the past 3 months (01/13/2023); 1/10 at worst for the past 7 days (01/24/2023) Goal status: MET  2.  Pt will improve her R hip FOTO score by at least 10 points as a demonstration of improved function.  Baseline: R hip FOTO 44 (01/13/2023); 99 (01/24/2023) Goal status: MET  3.  Pt will improve her hip extension and abduction strength by at least 1/2 MMT grade to promote ability to ambulate, and perform standing tasks more comfortably.  Baseline:  MMT Right eval Left eval  Hip extension 4- 4-  Hip abduction 4- with R lateral thigh pain reproduction 4-  (01/13/2023)   Goal status: ongoing   PLAN:  PT FREQUENCY: 1-2x/week  PT DURATION: 8 weeks  PLANNED INTERVENTIONS: 97110-Therapeutic exercises, 97530- Therapeutic activity, [id number]- Neuromuscular re-education, 97140- Manual therapy, [id number]- Gait training, 76160- Electrical stimulation (unattended), [id number]- Traction (mechanical), [id number]- Ionotophoresis 4mg /ml Dexamethasone, Patient/Family education, Dry Needling, Joint mobilization, Joint manipulation, Spinal manipulation, and Spinal mobilization.  PLAN FOR NEXT SESSION: improve posture, trunk and glute strength, femoral control, manual techniques, modalities PRN  Thank you for your referral.  Railyn House, PT, DPT 01/24/2023, 9:30 AM

## 2023-01-26 ENCOUNTER — Other Ambulatory Visit: Payer: Self-pay | Admitting: Physician Assistant

## 2023-01-26 ENCOUNTER — Ambulatory Visit: Payer: No Typology Code available for payment source

## 2023-01-26 DIAGNOSIS — J3089 Other allergic rhinitis: Secondary | ICD-10-CM

## 2023-02-04 DIAGNOSIS — M7631 Iliotibial band syndrome, right leg: Secondary | ICD-10-CM | POA: Diagnosis not present

## 2023-02-04 DIAGNOSIS — G5711 Meralgia paresthetica, right lower limb: Secondary | ICD-10-CM | POA: Diagnosis not present

## 2023-02-04 DIAGNOSIS — M7061 Trochanteric bursitis, right hip: Secondary | ICD-10-CM | POA: Diagnosis not present

## 2023-02-09 ENCOUNTER — Other Ambulatory Visit: Payer: Self-pay | Admitting: Physician Assistant

## 2023-02-09 DIAGNOSIS — K219 Gastro-esophageal reflux disease without esophagitis: Secondary | ICD-10-CM

## 2023-02-11 DIAGNOSIS — Z961 Presence of intraocular lens: Secondary | ICD-10-CM | POA: Diagnosis not present

## 2023-02-11 DIAGNOSIS — E119 Type 2 diabetes mellitus without complications: Secondary | ICD-10-CM | POA: Diagnosis not present

## 2023-02-11 DIAGNOSIS — H43813 Vitreous degeneration, bilateral: Secondary | ICD-10-CM | POA: Diagnosis not present

## 2023-02-14 DIAGNOSIS — E1169 Type 2 diabetes mellitus with other specified complication: Secondary | ICD-10-CM | POA: Diagnosis not present

## 2023-02-14 DIAGNOSIS — K219 Gastro-esophageal reflux disease without esophagitis: Secondary | ICD-10-CM | POA: Diagnosis not present

## 2023-02-14 DIAGNOSIS — Z008 Encounter for other general examination: Secondary | ICD-10-CM | POA: Diagnosis not present

## 2023-02-14 DIAGNOSIS — I10 Essential (primary) hypertension: Secondary | ICD-10-CM | POA: Diagnosis not present

## 2023-02-14 DIAGNOSIS — Z6837 Body mass index (BMI) 37.0-37.9, adult: Secondary | ICD-10-CM | POA: Diagnosis not present

## 2023-02-14 DIAGNOSIS — E785 Hyperlipidemia, unspecified: Secondary | ICD-10-CM | POA: Diagnosis not present

## 2023-02-24 ENCOUNTER — Telehealth (INDEPENDENT_AMBULATORY_CARE_PROVIDER_SITE_OTHER): Payer: No Typology Code available for payment source | Admitting: Physician Assistant

## 2023-02-24 ENCOUNTER — Encounter: Payer: Self-pay | Admitting: Physician Assistant

## 2023-02-24 VITALS — BP 137/90 | HR 61 | Ht 60.0 in | Wt 192.3 lb

## 2023-02-24 DIAGNOSIS — M25561 Pain in right knee: Secondary | ICD-10-CM | POA: Diagnosis not present

## 2023-02-24 DIAGNOSIS — G8929 Other chronic pain: Secondary | ICD-10-CM

## 2023-02-24 DIAGNOSIS — R7303 Prediabetes: Secondary | ICD-10-CM | POA: Diagnosis not present

## 2023-02-24 DIAGNOSIS — I10 Essential (primary) hypertension: Secondary | ICD-10-CM

## 2023-02-24 NOTE — Progress Notes (Cosign Needed Addendum)
[facility] [address] [place], [place] [address]  Internal MEDICINE  Telephone Visit  Patient Name: Jamie Powers  [id number]  [id number]  Date of Service: 02/24/2023  I connected with the patient at 10:35 by telephone and verified the patients identity using two identifiers.   I discussed the limitations, risks, security and privacy concerns of performing an evaluation and management service by telephone and the availability of in person appointments. I also discussed with the patient that there may be a patient responsible charge related to the service.  The patient expressed understanding and agrees to proceed.    Chief Complaint  Patient presents with   Telephone Screen    [id number]   Telephone Assessment   Hypertension   Gastroesophageal Reflux    HPI Pt is here for virtual follow up due to weather -Seeing ortho now and was doing PT for greater trochanteric bursitis and IT band syndrome on right side. Pain resolved with PT and is now following up PRN. Has exercises -Bp fluctuates, 137/90 was before medication, always improves after. Just took her meds and will check again in a little bit -insurance would not cover GLP1 -sees nephrology in March  Current Medication: Outpatient Encounter Medications as of 02/24/2023  Medication Sig   amLODipine (NORVASC) 2.5 MG tablet TAKE ONE TABLET BY MOUTH DAILY AT 9AM   Blood Glucose Monitoring Suppl (ONETOUCH VERIO) w/Device KIT Use as directed. E11.9   CALCIUM-VITAMIN D PO Take by mouth.   cetirizine (ZYRTEC) 10 MG tablet TAKE ONE TABLET BY MOUTH DAILY AT 9AM   diclofenac Sodium (VOLTAREN) 1 % GEL Apply 4 g topically 4 (four) times daily.   empagliflozin (JARDIANCE) 25 MG TABS tablet Take 25 mg by mouth daily.   FLUoxetine (PROZAC) 10 MG capsule TAKE ONE CAPSULE BY MOUTH DAILY AT 9AM   hydrocortisone 2.5 % cream Apply 1 application topically as needed.   losartan (COZAAR) 50 MG tablet TAKE ONE TABLET (50 MG) BY  MOUTH TWICE DAILY @ 9AM & 5PM   nystatin cream (MYCOSTATIN) Apply topically 3 (three) times daily.   pantoprazole (PROTONIX) 40 MG tablet TAKE ONE TABLET (40MG ) BY MOUTH DAILY AT 9AM   rosuvastatin (CRESTOR) 5 MG tablet TAKE ONE TABLET BY MOUTH DAILY AT 5PM   Semaglutide,0.25 or 0.5MG /DOS, 2 MG/3ML SOPN Inject 0.25 mg into the skin once a week.   VENTOLIN HFA 108 (90 Base) MCG/ACT inhaler INHALE 2 PUFFS INTO THE LUNGS EVERY 6 HOURS AS NEEDED FOR WHEEZING OR SHORTNESS OF BREATH   Vitamin D, Ergocalciferol, (DRISDOL) 1.25 MG (50000 UNIT) CAPS capsule TAKE 1 CAPSULE BY MOUTH EVERY MONDAY AT 9AM   [DISCONTINUED] Celecoxib (CELEBREX PO) Take by mouth.   [DISCONTINUED] metroNIDAZOLE (FLAGYL) 500 MG tablet Take 1 tablet (500 mg total) by mouth 2 (two) times daily.   No facility-administered encounter medications on file as of 02/24/2023.    Surgical History: Past Surgical History:  Procedure Laterality Date   BREAST BIOPSY Left 2000   neg   BREAST SURGERY     CATARACT EXTRACTION W/PHACO Left 06/30/2022   Procedure: CATARACT EXTRACTION PHACO AND INTRAOCULAR LENS PLACEMENT (IOC) LEFT DIABETIC 10.40 01:05.1;  Surgeon: Lockie Mola, MD;  Location: [facility];  Service: Ophthalmology;  Laterality: Left;   CATARACT EXTRACTION W/PHACO Right 07/14/2022   Procedure: CATARACT EXTRACTION PHACO AND INTRAOCULAR LENS PLACEMENT (IOC) RIGHT DIABETIC 13.38 01:16.9;  Surgeon: Lockie Mola, MD;  Location: [facility];  Service: Ophthalmology;  Laterality: Right;   CHOLECYSTECTOMY  COLONOSCOPY WITH PROPOFOL N/A 01/31/2015   Procedure: COLONOSCOPY WITH PROPOFOL;  Surgeon: Christena Deem, MD;  Location: [facility] END[facility];  Service: Endoscopy;  Laterality: N/A;   TONSILLECTOMY      Medical History: Past Medical History:  Diagnosis Date   GERD (gastroesophageal reflux disease)    Hypertension    Obesity    Pre-diabetes    Protein in urine     Family History: Family History   Problem Relation Age of Onset   Breast cancer Paternal Aunt    Hypertension Mother    Heart disease Father     Social History   Socioeconomic History   Marital status: Married    Spouse name: Not on file   Number of children: Not on file   Years of education: Not on file   Highest education level: Not on file  Occupational History   Not on file  Tobacco Use   Smoking status: Never   Smokeless tobacco: Never  Substance and Sexual Activity   Alcohol use: No   Drug use: No   Sexual activity: Not on file  Other Topics Concern   Not on file  Social History Narrative   Not on file   Social Drivers of Health   Financial Resource Strain: Low Risk  (04/16/2020)   Overall Financial Resource Strain (CARDIA)    Difficulty of Paying Living Expenses: Not hard at all  Food Insecurity: Not on file  Transportation Needs: Not on file  Physical Activity: Not on file  Stress: Not on file  Social Connections: Not on file  Intimate Partner Violence: Not on file      Review of Systems  Constitutional:  Negative for chills, fatigue and unexpected weight change.  HENT:  Negative for congestion, rhinorrhea, sneezing and sore throat.   Eyes:  Negative for redness.  Respiratory:  Negative for cough, chest tightness and shortness of breath.   Cardiovascular:  Negative for chest pain and palpitations.  Gastrointestinal:  Negative for abdominal pain, constipation, diarrhea, nausea and vomiting.  Genitourinary:  Negative for dysuria and frequency.  Musculoskeletal:  Negative for arthralgias, back pain, joint swelling and neck pain.  Skin:  Negative for rash.  Neurological: Negative.  Negative for tremors and numbness.  Hematological:  Negative for adenopathy. Does not bruise/bleed easily.  Psychiatric/Behavioral:  Negative for behavioral problems (Depression), sleep disturbance and suicidal ideas. The patient is not nervous/anxious.     Vital Signs: BP (!) 137/90   Pulse 61   Ht 5'  (1.524 m)   Wt 192 lb 4.8 oz (87.2 kg)   BMI 37.56 kg/m    Observation/Objective:  Pt is able to carry out conversation   Assessment/Plan: 1. Essential hypertension (Primary) Borderline this morning, but was before meds, generally doing well. Continue current medication and monitoring  2. Prediabetes Continues to take Jardiance, due for A1c soon  3. Chronic pain of right knee Knee and hip pain resolved with recent PT. No longer taking celebrex.    General Counseling: madia carvell understanding of the findings of today's phone visit and agrees with plan of treatment. I have discussed any further diagnostic evaluation that may be needed or ordered today. We also reviewed her medications today. she has been encouraged to call the office with any questions or concerns that should arise related to todays visit.    No orders of the defined types were placed in this encounter.   No orders of the defined types were placed in this encounter.  Time spent:30 Minutes    Dr Lyndon Code Internal medicine

## 2023-03-15 DIAGNOSIS — I10 Essential (primary) hypertension: Secondary | ICD-10-CM | POA: Diagnosis not present

## 2023-03-15 DIAGNOSIS — N181 Chronic kidney disease, stage 1: Secondary | ICD-10-CM | POA: Diagnosis not present

## 2023-03-15 DIAGNOSIS — R809 Proteinuria, unspecified: Secondary | ICD-10-CM | POA: Diagnosis not present

## 2023-03-21 DIAGNOSIS — R809 Proteinuria, unspecified: Secondary | ICD-10-CM | POA: Diagnosis not present

## 2023-03-21 DIAGNOSIS — I10 Essential (primary) hypertension: Secondary | ICD-10-CM | POA: Diagnosis not present

## 2023-03-24 ENCOUNTER — Telehealth: Payer: Self-pay

## 2023-03-24 NOTE — Telephone Encounter (Signed)
 Patient negative for diabetic retinopathy. Eye exam completed on 02/11/2023.

## 2023-04-25 ENCOUNTER — Encounter: Payer: Self-pay | Admitting: Physician Assistant

## 2023-04-25 ENCOUNTER — Ambulatory Visit (INDEPENDENT_AMBULATORY_CARE_PROVIDER_SITE_OTHER): Payer: No Typology Code available for payment source | Admitting: Physician Assistant

## 2023-04-25 VITALS — BP 110/75 | HR 79 | Temp 98.4°F | Resp 16 | Ht 60.0 in | Wt 198.0 lb

## 2023-04-25 DIAGNOSIS — E782 Mixed hyperlipidemia: Secondary | ICD-10-CM | POA: Diagnosis not present

## 2023-04-25 DIAGNOSIS — E669 Obesity, unspecified: Secondary | ICD-10-CM

## 2023-04-25 DIAGNOSIS — G4733 Obstructive sleep apnea (adult) (pediatric): Secondary | ICD-10-CM

## 2023-04-25 DIAGNOSIS — I10 Essential (primary) hypertension: Secondary | ICD-10-CM

## 2023-04-25 DIAGNOSIS — R7303 Prediabetes: Secondary | ICD-10-CM

## 2023-04-25 DIAGNOSIS — E538 Deficiency of other specified B group vitamins: Secondary | ICD-10-CM

## 2023-04-25 DIAGNOSIS — R5383 Other fatigue: Secondary | ICD-10-CM | POA: Diagnosis not present

## 2023-04-25 DIAGNOSIS — E559 Vitamin D deficiency, unspecified: Secondary | ICD-10-CM

## 2023-04-25 DIAGNOSIS — R946 Abnormal results of thyroid function studies: Secondary | ICD-10-CM

## 2023-04-25 NOTE — Progress Notes (Signed)
 [facility] [address] [place], [place] [address]  Internal MEDICINE  Office Visit Note  Patient Name: Jamie Powers  [id number]  [id number]  Date of Service: 04/25/2023  Chief Complaint  Patient presents with   Follow-up    Review labs   Hypertension   Gastroesophageal Reflux    HPI Pt is here for routine follow up -BP elevated before med at home, but then improves after meds and is stable in office -continue to follow up with nephrology -due for labs for CPE next visit -states she is interested in zepbound for wt loss, but GLP1 not covered by her insurance. She did see where it was used for OSA and she wonders if it cuold be an option for this instead of wt loss as primary. She understands there is no guarantee of coverage even if sleep study confirms she still has OSA -she does have a long hx of OSA, did not tolerate CPAP at the time. Open to trying again and will retest -husband states she snores, wakes up frequently and has trouble getting back to sleep. Poorly refreshing sleep. Will sometimes take naps.  EPWORTH SLEEPINESS SCALE:  Scale:  (0)= no chance of dozing; (1)= slight chance of dozing; (2)= moderate chance of dozing; (3)= high chance of dozing  Chance  Situtation    Sitting and reading: 2    Watching TV: 3    Sitting Inactive in public: 0    As a passenger in car: 2      Lying down to rest: 3    Sitting and talking: 0    Sitting quielty after lunch: 3    In a car, stopped in traffic: 0   TOTAL SCORE:   13 out of 24   Current Medication: Outpatient Encounter Medications as of 04/25/2023  Medication Sig   amLODipine  (NORVASC ) 2.5 MG tablet TAKE ONE TABLET BY MOUTH DAILY AT 9AM   Blood Glucose Monitoring Suppl (ONETOUCH VERIO) w/Device KIT Use as directed. E11.9   CALCIUM -VITAMIN D  PO Take by mouth.   cetirizine  (ZYRTEC ) 10 MG tablet TAKE ONE TABLET BY MOUTH DAILY AT 9AM   diclofenac  Sodium (VOLTAREN ) 1 % GEL Apply 4 g topically 4  (four) times daily.   empagliflozin  (JARDIANCE ) 25 MG TABS tablet Take 25 mg by mouth daily.   FLUoxetine  (PROZAC ) 10 MG capsule TAKE ONE CAPSULE BY MOUTH DAILY AT 9AM   hydrocortisone 2.5 % cream Apply 1 application topically as needed.   losartan  (COZAAR ) 50 MG tablet TAKE ONE TABLET (50 MG) BY MOUTH TWICE DAILY @ 9AM & 5PM   nystatin  cream (MYCOSTATIN ) Apply topically 3 (three) times daily.   pantoprazole  (PROTONIX ) 40 MG tablet TAKE ONE TABLET (40MG ) BY MOUTH DAILY AT 9AM   rosuvastatin  (CRESTOR ) 5 MG tablet TAKE ONE TABLET BY MOUTH DAILY AT 5PM   Semaglutide ,0.25 or 0.5MG /DOS, 2 MG/3ML SOPN Inject 0.25 mg into the skin once a week.   VENTOLIN  HFA 108 (90 Base) MCG/ACT inhaler INHALE 2 PUFFS INTO THE LUNGS EVERY 6 HOURS AS NEEDED FOR WHEEZING OR SHORTNESS OF BREATH   Vitamin D , Ergocalciferol , (DRISDOL ) 1.25 MG (50000 UNIT) CAPS capsule TAKE 1 CAPSULE BY MOUTH EVERY MONDAY AT 9AM   No facility-administered encounter medications on file as of 04/25/2023.    Surgical History: Past Surgical History:  Procedure Laterality Date   BREAST BIOPSY Left 2000   neg   BREAST SURGERY     CATARACT EXTRACTION W/PHACO Left 06/30/2022   Procedure: CATARACT EXTRACTION  PHACO AND INTRAOCULAR LENS PLACEMENT (IOC) LEFT DIABETIC 10.40 01:05.1;  Surgeon: Annell Kidney, MD;  Location: [facility];  Service: Ophthalmology;  Laterality: Left;   CATARACT EXTRACTION W/PHACO Right 07/14/2022   Procedure: CATARACT EXTRACTION PHACO AND INTRAOCULAR LENS PLACEMENT (IOC) RIGHT DIABETIC 13.38 01:16.9;  Surgeon: Annell Kidney, MD;  Location: [facility];  Service: Ophthalmology;  Laterality: Right;   CHOLECYSTECTOMY     COLONOSCOPY WITH PROPOFOL  N/A 01/31/2015   Procedure: COLONOSCOPY WITH PROPOFOL ;  Surgeon: Deveron Fly, MD;  Location[facility];  Service: Endoscopy;  Laterality: N/A;   TONSILLECTOMY      Medical History: Past Medical History:  Diagnosis Date   GERD  (gastroesophageal reflux disease)    Hypertension    Obesity    Pre-diabetes    Protein in urine     Family History: Family History  Problem Relation Age of Onset   Breast cancer Paternal Aunt    Hypertension Mother    Heart disease Father     Social History   Socioeconomic History   Marital status: Married    Spouse name: Not on file   Number of children: Not on file   Years of education: Not on file   Highest education level: Not on file  Occupational History   Not on file  Tobacco Use   Smoking status: Never   Smokeless tobacco: Never  Substance and Sexual Activity   Alcohol use: No   Drug use: No   Sexual activity: Not on file  Other Topics Concern   Not on file  Social History Narrative   Not on file   Social Drivers of Health   Financial Resource Strain: Low Risk  (04/16/2020)   Overall Financial Resource Strain (CARDIA)    Difficulty of Paying Living Expenses: Not hard at all  Food Insecurity: Not on file  Transportation Needs: Not on file  Physical Activity: Not on file  Stress: Not on file  Social Connections: Not on file  Intimate Partner Violence: Not on file      Review of Systems  Constitutional:  Negative for chills, fatigue and unexpected weight change.  HENT:  Negative for congestion, rhinorrhea, sneezing and sore throat.   Eyes:  Negative for redness.  Respiratory:  Negative for cough, chest tightness and shortness of breath.   Cardiovascular:  Negative for chest pain and palpitations.  Gastrointestinal:  Negative for abdominal pain, constipation, diarrhea, nausea and vomiting.  Genitourinary:  Negative for dysuria and frequency.  Musculoskeletal:  Negative for arthralgias, back pain, joint swelling and neck pain.  Skin:  Negative for rash.  Neurological: Negative.  Negative for tremors and numbness.  Hematological:  Negative for adenopathy. Does not bruise/bleed easily.  Psychiatric/Behavioral:  Positive for sleep disturbance. Negative  for behavioral problems (Depression) and suicidal ideas. The patient is not nervous/anxious.     Vital Signs: BP 110/75   Pulse 79   Temp 98.4 F (36.9 C)   Resp 16   Ht 5' (1.524 m)   Wt 198 lb (89.8 kg)   SpO2 94%   BMI 38.67 kg/m    Physical Exam Vitals and nursing note reviewed.  Constitutional:      General: She is not in acute distress.    Appearance: Normal appearance. She is well-developed. She is obese. She is not diaphoretic.  HENT:     Head: Normocephalic and atraumatic.  Neck:     Thyroid : No thyromegaly.     Vascular: No JVD.  Trachea: No tracheal deviation.  Cardiovascular:     Rate and Rhythm: Normal rate and regular rhythm.     Heart sounds: Normal heart sounds.  Pulmonary:     Effort: Pulmonary effort is normal.  Chest:  Breasts:    Right: Normal. No mass.     Left: Normal. No mass.  Musculoskeletal:        General: No tenderness. Normal range of motion.     Cervical back: Normal range of motion and neck supple.  Lymphadenopathy:     Cervical: No cervical adenopathy.  Skin:    General: Skin is warm and dry.  Neurological:     Mental Status: She is alert and oriented to person, place, and time.     Cranial Nerves: No cranial nerve deficit.  Psychiatric:        Behavior: Behavior normal.        Thought Content: Thought content normal.        Judgment: Judgment normal.        Assessment/Plan: 1. Obstructive sleep apnea (Primary) Hx of OSA, with continued snoring, frequent nighttime awakenings, fatigue, and elevated BMI, will order PSG to re-evaluate. May be interested in zepbound as potential tx option in future - PSG SLEEP STUDY; Future  2. Essential hypertension Well controlled, continue current medications  3. Prediabetes - Hgb A1C w/o eAG  4. B12 deficiency - B12 and Folate Panel  5. Vitamin D  deficiency - VITAMIN D  25 Hydroxy (Vit-D Deficiency, Fractures)  6. Mixed hyperlipidemia - Lipid Panel With LDL/HDL Ratio  7.  Abnormal thyroid  exam - TSH + free T4  8. Other fatigue - CBC w/Diff/Platelet - Comprehensive metabolic panel with GFR - TSH + free T4 - Lipid Panel With LDL/HDL Ratio - VITAMIN D  25 Hydroxy (Vit-D Deficiency, Fractures) - B12 and Folate Panel - Hgb A1C w/o eAG  9. Obesity (BMI 30-39.9) Interested in GLP1 to help wt loss, but not covered by insurance. Will recheck A1c and will have PSG as she may have other conditions in which GLP1 would be beneficial. Will also work on diet and exercise - PSG SLEEP STUDY; Future   General Counseling: meesha sek understanding of the findings of todays visit and agrees with plan of treatment. I have discussed any further diagnostic evaluation that may be needed or ordered today. We also reviewed her medications today. she has been encouraged to call the office with any questions or concerns that should arise related to todays visit.    Orders Placed This Encounter  Procedures   CBC w/Diff/Platelet   Comprehensive metabolic panel with GFR   TSH + free T4   Lipid Panel With LDL/HDL Ratio   VITAMIN D  25 Hydroxy (Vit-D Deficiency, Fractures)   B12 and Folate Panel   Hgb A1C w/o eAG   PSG SLEEP STUDY    No orders of the defined types were placed in this encounter.   This patient was seen by Taylor Favia, PA-C in collaboration with Dr. Verneta Gone as a part of collaborative care agreement.   Total time spent:30 Minutes Time spent includes review of chart, medications, test results, and follow up plan with the patient.      Dr Fozia M Khan Internal medicine

## 2023-04-26 ENCOUNTER — Telehealth: Payer: Self-pay | Admitting: Physician Assistant

## 2023-04-26 NOTE — Telephone Encounter (Signed)
 SS order emailed to [facility] w/  [facility]

## 2023-05-11 ENCOUNTER — Telehealth: Payer: Self-pay | Admitting: Physician Assistant

## 2023-05-11 NOTE — Telephone Encounter (Signed)
 [facility] unable to reach patient to schedule SS. Patient not returning their call. I lvm and sent msg to patient to call me back and let me know if she is still interested in having SS done[facility]

## 2023-05-12 ENCOUNTER — Telehealth: Payer: Self-pay | Admitting: Physician Assistant

## 2023-05-12 NOTE — Telephone Encounter (Signed)
 SS appointment 06/15/23 @ [facility]

## 2023-06-07 ENCOUNTER — Telehealth: Payer: Self-pay

## 2023-06-07 NOTE — Progress Notes (Signed)
   06/07/2023  Patient ID: Jamie Powers, female   DOB: 1955-05-04, 68 y.o.   MRN: [id number]  This patient is appearing on a report for being at risk of failing the adherence measure for identified medications this calendar year.   Medication Adherence Summary (STAR/HEDIS Monitoring): Adherence Category: diabetes    Drug Name: Jardiance  25 mg  Last Fill or Sold Date:03/04/2023 Days' Supply: 30      Notes: ? Adherence data pulled from pharmacy claims portal Dr. Anson Powers. - Per chart review, patient is prediabetic. Last hemoglobin A1c 6.1% (as og 07/16/22). Patient is followed by nephrology and Jardiance  is prescribed by Jamie Powers @ [phone]. I called the prescribers numbers and spoke with Jamie Powers to leave a message to the provider. I also called [facility] @ [phone] and left a message. ? I called patient and left a message.  ? Reviewed barriers to adherence: none identified. ? Plan: MyChart message sent to patient.  Jamie Powers, PharmD Clinical Pharmacist Cell: [phone]

## 2023-06-08 ENCOUNTER — Telehealth: Payer: Self-pay

## 2023-06-08 NOTE — Progress Notes (Signed)
   06/08/2023  Patient ID: Jamie Powers, female   DOB: [birth year], 68 y.o.   MRN: [id number]  Laurina Popper from [facility] called back this morning and stated that Dr. Zelda Hickman wants the patient to continue taking Jardiance  every morning. The patient requested a 90-day supply through mail order. They do not provide medication assistance, which could explain the gap in the fill history if this was previously done. I called the patient yesterday but was unable to reach them. I will follow up again at another time.  [name], PharmD Clinical Pharmacist Cell: [phone]

## 2023-06-14 ENCOUNTER — Ambulatory Visit (INDEPENDENT_AMBULATORY_CARE_PROVIDER_SITE_OTHER): Admitting: Nurse Practitioner

## 2023-06-14 ENCOUNTER — Encounter: Payer: Self-pay | Admitting: Nurse Practitioner

## 2023-06-14 VITALS — BP 124/88 | HR 95 | Temp 97.8°F | Resp 16 | Ht 60.0 in | Wt 199.0 lb

## 2023-06-14 DIAGNOSIS — R062 Wheezing: Secondary | ICD-10-CM | POA: Diagnosis not present

## 2023-06-14 DIAGNOSIS — R051 Acute cough: Secondary | ICD-10-CM | POA: Diagnosis not present

## 2023-06-14 DIAGNOSIS — B9689 Other specified bacterial agents as the cause of diseases classified elsewhere: Secondary | ICD-10-CM

## 2023-06-14 DIAGNOSIS — J208 Acute bronchitis due to other specified organisms: Secondary | ICD-10-CM

## 2023-06-14 MED ORDER — HYDROCOD POLI-CHLORPHE POLI ER 10-8 MG/5ML PO SUER: 5.0000 mL | Freq: Two times a day (BID) | ORAL | 0 refills | Status: DC | PRN

## 2023-06-14 MED ORDER — AZITHROMYCIN 250 MG PO TABS: ORAL_TABLET | ORAL | 0 refills | Status: AC

## 2023-06-14 NOTE — Progress Notes (Signed)
 [facility] [address] [place], [place] [address]  Internal MEDICINE  Office Visit Note  Patient Name: Jamie Powers  [id number]  [id number]  Date of Service: 06/14/2023  Chief Complaint  Patient presents with   Acute Visit    Coughing, wheezing.      HPI Jamie Powers presents for an acute sick visit for upper respiratory symptoms.  --onset of symptoms was about 3 days ago.  --negative for covid --reports cough, wheezing, sinus drainage, PND, runny nose, nasal congestion, chills and headache.  Denies any fever, fatigue, body aches, SOB or chest tightness.  --has been taking coricidin and this helps a little bit.  --can't sleep due to the coughing.    .     Current Medication:  Outpatient Encounter Medications as of 06/14/2023  Medication Sig   amLODipine  (NORVASC ) 2.5 MG tablet TAKE ONE TABLET BY MOUTH DAILY AT 9AM   azithromycin  (ZITHROMAX ) 250 MG tablet Take 2 tablets on day 1, then 1 tablet daily on days 2 through 5   Blood Glucose Monitoring Suppl (ONETOUCH VERIO) w/Device KIT Use as directed. E11.9   CALCIUM -VITAMIN D  PO Take by mouth.   cetirizine  (ZYRTEC ) 10 MG tablet TAKE ONE TABLET BY MOUTH DAILY AT 9AM   chlorpheniramine-HYDROcodone (TUSSIONEX) 10-8 MG/5ML Take 5 mLs by mouth every 12 (twelve) hours as needed for cough.   diclofenac  Sodium (VOLTAREN ) 1 % GEL Apply 4 g topically 4 (four) times daily.   empagliflozin  (JARDIANCE ) 25 MG TABS tablet Take 25 mg by mouth daily.   FLUoxetine  (PROZAC ) 10 MG capsule TAKE ONE CAPSULE BY MOUTH DAILY AT 9AM   hydrocortisone 2.5 % cream Apply 1 application topically as needed.   losartan  (COZAAR ) 50 MG tablet TAKE ONE TABLET (50 MG) BY MOUTH TWICE DAILY @ 9AM & 5PM   nystatin  cream (MYCOSTATIN ) Apply topically 3 (three) times daily.   pantoprazole  (PROTONIX ) 40 MG tablet TAKE ONE TABLET (40MG ) BY MOUTH DAILY AT 9AM   rosuvastatin  (CRESTOR ) 5 MG tablet TAKE ONE TABLET BY MOUTH DAILY AT 5PM   Semaglutide ,0.25 or  0.5MG /DOS, 2 MG/3ML SOPN Inject 0.25 mg into the skin once a week.   VENTOLIN  HFA 108 (90 Base) MCG/ACT inhaler INHALE 2 PUFFS INTO THE LUNGS EVERY 6 HOURS AS NEEDED FOR WHEEZING OR SHORTNESS OF BREATH   Vitamin D , Ergocalciferol , (DRISDOL ) 1.25 MG (50000 UNIT) CAPS capsule TAKE 1 CAPSULE BY MOUTH EVERY MONDAY AT 9AM   No facility-administered encounter medications on file as of 06/14/2023.      Medical History: Past Medical History:  Diagnosis Date   GERD (gastroesophageal reflux disease)    Hypertension    Obesity    Pre-diabetes    Protein in urine      Vital Signs: BP 124/88   Pulse 95   Temp 97.8 F (36.6 C)   Resp 16   Ht 5' (1.524 m)   Wt 199 lb (90.3 kg)   SpO2 96%   BMI 38.86 kg/m    Review of Systems  Constitutional:  Positive for chills and fatigue. Negative for appetite change and fever.  HENT:  Positive for congestion, postnasal drip and rhinorrhea. Negative for sinus pressure, sinus pain and sore throat.   Respiratory:  Positive for cough and wheezing. Negative for chest tightness and shortness of breath.   Cardiovascular:  Negative for chest pain and palpitations.  Gastrointestinal:  Negative for diarrhea, nausea and vomiting.  Endocrine: Positive for heat intolerance.  Musculoskeletal:  Negative for myalgias.  Neurological:  Positive for headaches.    Physical Exam Vitals reviewed.  HENT:     Head: Normocephalic and atraumatic.     Right Ear: Hearing, tympanic membrane, ear canal and external ear normal.     Left Ear: Hearing, tympanic membrane, ear canal and external ear normal.     Nose: Congestion and rhinorrhea present. Rhinorrhea is clear.     Right Turbinates: Not enlarged, swollen or pale.     Left Turbinates: Not enlarged, swollen or pale.     Right Sinus: No maxillary sinus tenderness or frontal sinus tenderness.     Left Sinus: No maxillary sinus tenderness or frontal sinus tenderness.     Mouth/Throat:     Lips: Pink.     Mouth:  Mucous membranes are moist.     Pharynx: Oropharynx is clear.  Cardiovascular:     Rate and Rhythm: Normal rate and regular rhythm.     Pulses: Normal pulses.     Heart sounds: S1 normal and S2 normal.  Pulmonary:     Effort: Pulmonary effort is normal. No accessory muscle usage or respiratory distress.     Breath sounds: Normal air entry. Examination of the right-upper field reveals wheezing. Examination of the left-upper field reveals wheezing. Wheezing present.  Musculoskeletal:     Right lower leg: No edema.     Left lower leg: No edema.       Assessment/Plan: 1. Acute bacterial bronchitis (Primary) Zpak prescribed. Take until gone.  - azithromycin  (ZITHROMAX ) 250 MG tablet; Take 2 tablets on day 1, then 1 tablet daily on days 2 through 5  Dispense: 6 tablet; Refill: 0 - chlorpheniramine-HYDROcodone (TUSSIONEX) 10-8 MG/5ML; Take 5 mLs by mouth every 12 (twelve) hours as needed for cough.  Dispense: 140 mL; Refill: 0  2. Acute cough Medication prescribed for cough.  - chlorpheniramine-HYDROcodone (TUSSIONEX) 10-8 MG/5ML; Take 5 mLs by mouth every 12 (twelve) hours as needed for cough.  Dispense: 140 mL; Refill: 0  3. Wheezing Only happens at night, no intervention at this time, the antibiotic should take care of this.    General Counseling: Jamie Powers understanding of the findings of todays visit and agrees with plan of treatment. I have discussed any further diagnostic evaluation that may be needed or ordered today. We also reviewed her medications today. she has been encouraged to call the office with any questions or concerns that should arise related to todays visit.    Counseling:    No orders of the defined types were placed in this encounter.   Meds ordered this encounter  Medications   azithromycin  (ZITHROMAX ) 250 MG tablet    Sig: Take 2 tablets on day 1, then 1 tablet daily on days 2 through 5    Dispense:  6 tablet    Refill:  0    chlorpheniramine-HYDROcodone (TUSSIONEX) 10-8 MG/5ML    Sig: Take 5 mLs by mouth every 12 (twelve) hours as needed for cough.    Dispense:  140 mL    Refill:  0    Fill new script today    Return if symptoms worsen or fail to improve, for keep regular follow up with Lauren PA-C.  [place] Controlled Substance Database was reviewed by me for overdose risk score (ORS)  Time spent:30 Minutes Time spent with patient included reviewing progress notes, labs, imaging studies, and discussing plan for follow up.   This patient was seen by Laurence Pons, FNP-C in collaboration with Dr. Verneta Gone as a part of  collaborative care agreement.  Daryel Kenneth R. Bobbi Burow, MSN, FNP-C Internal Medicine

## 2023-06-21 ENCOUNTER — Telehealth: Payer: Self-pay

## 2023-06-21 NOTE — Progress Notes (Signed)
   06/21/2023  Patient ID: Jamie Powers, female   DOB: May 24, 1955, 68 y.o.   MRN: [id number]  Medication Adherence update:   Attempted to contact patient for medication management/review. Left HIPAA compliant message for patient to return my call at their convenience. Per Dr. Anson Basta, patient still has not picked up Jardiance .    Will follow up with patient in  2-4 weeks.  Thank you for allowing pharmacy to be a part of this patient's care.  [name], PharmD Clinical Pharmacist Cell: [phone]

## 2023-06-27 DIAGNOSIS — R946 Abnormal results of thyroid function studies: Secondary | ICD-10-CM | POA: Diagnosis not present

## 2023-06-27 DIAGNOSIS — R5383 Other fatigue: Secondary | ICD-10-CM | POA: Diagnosis not present

## 2023-06-27 DIAGNOSIS — E538 Deficiency of other specified B group vitamins: Secondary | ICD-10-CM | POA: Diagnosis not present

## 2023-06-27 DIAGNOSIS — R7303 Prediabetes: Secondary | ICD-10-CM | POA: Diagnosis not present

## 2023-06-27 DIAGNOSIS — E559 Vitamin D deficiency, unspecified: Secondary | ICD-10-CM | POA: Diagnosis not present

## 2023-06-27 DIAGNOSIS — E782 Mixed hyperlipidemia: Secondary | ICD-10-CM | POA: Diagnosis not present

## 2023-06-29 LAB — CBC WITH DIFFERENTIAL/PLATELET
Basophils Absolute: 0.1 10*3/uL (ref 0.0–0.2)
Basos: 1 %
EOS (ABSOLUTE): 0.2 10*3/uL (ref 0.0–0.4)
Eos: 4 %
Hematocrit: 44.1 % (ref 34.0–46.6)
Hemoglobin: 14 g/dL (ref 11.1–15.9)
Immature Grans (Abs): 0 10*3/uL (ref 0.0–0.1)
Immature Granulocytes: 0 %
Lymphocytes Absolute: 1.9 10*3/uL (ref 0.7–3.1)
Lymphs: 34 %
MCH: 30.5 pg (ref 26.6–33.0)
MCHC: 31.7 g/dL (ref 31.5–35.7)
MCV: 96 fL (ref 79–97)
Monocytes Absolute: 0.4 10*3/uL (ref 0.1–0.9)
Monocytes: 6 %
Neutrophils Absolute: 3 10*3/uL (ref 1.4–7.0)
Neutrophils: 55 %
Platelets: 322 10*3/uL (ref 150–450)
RBC: 4.59 x10E6/uL (ref 3.77–5.28)
RDW: 13.2 % (ref 11.7–15.4)
WBC: 5.6 10*3/uL (ref 3.4–10.8)

## 2023-06-29 LAB — COMPREHENSIVE METABOLIC PANEL WITH GFR
ALT: 22 IU/L (ref 0–32)
AST: 27 IU/L (ref 0–40)
Albumin: 3.6 g/dL — ABNORMAL LOW (ref 3.9–4.9)
Alkaline Phosphatase: 160 IU/L — ABNORMAL HIGH (ref 44–121)
BUN/Creatinine Ratio: 19 (ref 12–28)
BUN: 12 mg/dL (ref 8–27)
Bilirubin Total: 0.5 mg/dL (ref 0.0–1.2)
CO2: 24 mmol/L (ref 20–29)
Calcium: 9.1 mg/dL (ref 8.7–10.3)
Chloride: 105 mmol/L (ref 96–106)
Creatinine, Ser: 0.64 mg/dL (ref 0.57–1.00)
Globulin, Total: 3.2 g/dL (ref 1.5–4.5)
Glucose: 98 mg/dL (ref 70–99)
Potassium: 4.2 mmol/L (ref 3.5–5.2)
Sodium: 142 mmol/L (ref 134–144)
Total Protein: 6.8 g/dL (ref 6.0–8.5)
eGFR: 96 mL/min/{1.73_m2} (ref >59)

## 2023-06-29 LAB — LIPID PANEL WITH LDL/HDL RATIO
Cholesterol, Total: 125 mg/dL (ref 100–199)
HDL: 59 mg/dL (ref >39)
LDL Chol Calc (NIH): 53 mg/dL (ref 0–99)
LDL/HDL Ratio: 0.9 ratio (ref 0.0–3.2)
Triglycerides: 58 mg/dL (ref 0–149)
VLDL Cholesterol Cal: 13 mg/dL (ref 5–40)

## 2023-06-29 LAB — TSH+FREE T4
Free T4: 1.16 ng/dL (ref 0.82–1.77)
TSH: 1.22 u[IU]/mL (ref 0.450–4.500)

## 2023-06-29 LAB — VITAMIN D 25 HYDROXY (VIT D DEFICIENCY, FRACTURES): Vit D, 25-Hydroxy: 50 ng/mL (ref 30.0–100.0)

## 2023-06-29 LAB — B12 AND FOLATE PANEL
Folate: 11.9 ng/mL (ref >3.0)
Vitamin B-12: 609 pg/mL (ref 232–1245)

## 2023-06-29 LAB — HGB A1C W/O EAG: Hgb A1c MFr Bld: 6.2 % — ABNORMAL HIGH (ref 4.8–5.6)

## 2023-06-30 ENCOUNTER — Ambulatory Visit: Payer: Self-pay | Admitting: Physician Assistant

## 2023-07-06 ENCOUNTER — Telehealth: Payer: Self-pay

## 2023-07-06 NOTE — Progress Notes (Signed)
   07/06/2023  Patient ID: Jamie Powers, female   DOB: 06/14/1955, 68 y.o.   MRN: [id number]  This patient is appearing on a report for being at risk of failing the adherence measure for identified medications this calendar year.   Medication Adherence Summary (STAR/HEDIS Monitoring): Adherence Category: diabetes - Dx of prediabetes and CKD    Drug Name: Jardiance  Last Fill or Sold Date:03/04/2023 Days' Supply: 30 -A1c remains appropriate as she is prediabetic.       Notes: ? Adherence data pulled from pharmacy claims portal Dr. Annemarie. ? Patient states that she gets patient assistance for Jardiance  earlier this year by her nephrologist.  ? Reviewed barriers to adherence: none identified. ? Plan: No follow needed at this time. No Dx of diabetes and patient receives medication assistance. As such, patient will fail the measure.   Dorcas Solian, PharmD Clinical Pharmacist Cell: [phone]

## 2023-07-14 ENCOUNTER — Encounter (INDEPENDENT_AMBULATORY_CARE_PROVIDER_SITE_OTHER): Admitting: Internal Medicine

## 2023-07-14 ENCOUNTER — Encounter: Payer: Self-pay | Admitting: Physician Assistant

## 2023-07-14 ENCOUNTER — Ambulatory Visit: Payer: No Typology Code available for payment source | Admitting: Physician Assistant

## 2023-07-14 VITALS — BP 141/97 | HR 87 | Temp 98.3°F | Resp 16 | Ht 60.0 in | Wt 200.8 lb

## 2023-07-14 DIAGNOSIS — M65311 Trigger thumb, right thumb: Secondary | ICD-10-CM

## 2023-07-14 DIAGNOSIS — Z1231 Encounter for screening mammogram for malignant neoplasm of breast: Secondary | ICD-10-CM | POA: Diagnosis not present

## 2023-07-14 DIAGNOSIS — M25561 Pain in right knee: Secondary | ICD-10-CM | POA: Diagnosis not present

## 2023-07-14 DIAGNOSIS — G8929 Other chronic pain: Secondary | ICD-10-CM | POA: Diagnosis not present

## 2023-07-14 DIAGNOSIS — I10 Essential (primary) hypertension: Secondary | ICD-10-CM | POA: Diagnosis not present

## 2023-07-14 DIAGNOSIS — Z0000 Encounter for general adult medical examination without abnormal findings: Secondary | ICD-10-CM

## 2023-07-14 DIAGNOSIS — E2839 Other primary ovarian failure: Secondary | ICD-10-CM

## 2023-07-14 DIAGNOSIS — G4733 Obstructive sleep apnea (adult) (pediatric): Secondary | ICD-10-CM | POA: Diagnosis not present

## 2023-07-14 NOTE — Progress Notes (Signed)
 [facility] [address] [place], [place] [address]  Internal MEDICINE  Office Visit Note  Patient Name: Jamie Powers  [id number]  [id number]  Date of Service: 07/14/2023  Chief Complaint  Patient presents with   Medicare Wellness   Gastroesophageal Reflux   Medication Refill    HPI Jamie Powers presents for an annual well visit Well-appearing 68 y.o.female Routine CRC screening: UTD Routine mammogram: Due in Aug DEXA scan: Will update Labs: A1c still in prediabetic range, Alk phos elevated, vit D normal and can switch to OTC supplement Other concerns: SS was supposed to be last night but cancelled due to rain, she is going to call back -right thumb pain intermittently. It will get stuck at times. Right knee still bothersome. Will plan to follow up with ortho -Occasional right flank/side pain, no urinary symptoms. This pain comes and goes and is tender to the touch, feels muscular -has not had BP meds yet and will take these now     07/14/2023    8:59 AM 07/12/2022   11:11 AM 07/06/2021   10:57 AM  MMSE - Mini Mental State Exam  Orientation to time 5 5 5   Orientation to Place 5 5 5   Registration 3 3 3   Attention/ Calculation 5 5 5   Recall 3 3 3   Language- name 2 objects 2 2 2   Language- repeat 1 1 1   Language- follow 3 step command 3 3 3   Language- read & follow direction 1 1 1   Write a sentence 1 1 1   Copy design 1 1 1   Total score 30 30 30     Functional Status Survey: Is the patient deaf or have difficulty hearing?: No Does the patient have difficulty seeing, even when wearing glasses/contacts?: No Does the patient have difficulty concentrating, remembering, or making decisions?: No Does the patient have difficulty walking or climbing stairs?: No Does the patient have difficulty dressing or bathing?: No Does the patient have difficulty doing errands alone such as visiting a doctor's office or shopping?: No     04/12/2022   11:26 AM 07/12/2022   11:11 AM  10/14/2022   11:13 AM 02/24/2023   10:13 AM 07/14/2023    8:58 AM  Fall Risk  Falls in the past year? 0 0 1 0 0  Was there an injury with Fall?   0    Fall Risk Category Calculator   1    Fall risk Follow up    Falls evaluation completed        07/14/2023    8:58 AM  Depression screen PHQ 2/9  Decreased Interest 0  Down, Depressed, Hopeless 0  PHQ - 2 Score 0        No data to display            Current Medication: Outpatient Encounter Medications as of 07/14/2023  Medication Sig   amLODipine  (NORVASC ) 2.5 MG tablet TAKE ONE TABLET BY MOUTH DAILY AT 9AM   Blood Glucose Monitoring Suppl (ONETOUCH VERIO) w/Device KIT Use as directed. E11.9   CALCIUM -VITAMIN D  PO Take by mouth.   cetirizine  (ZYRTEC ) 10 MG tablet TAKE ONE TABLET BY MOUTH DAILY AT 9AM   chlorpheniramine-HYDROcodone (TUSSIONEX) 10-8 MG/5ML Take 5 mLs by mouth every 12 (twelve) hours as needed for cough.   diclofenac  Sodium (VOLTAREN ) 1 % GEL Apply 4 g topically 4 (four) times daily.   empagliflozin  (JARDIANCE ) 25 MG TABS tablet Take 25 mg by mouth daily.   FLUoxetine  (PROZAC ) 10 MG capsule  TAKE ONE CAPSULE BY MOUTH DAILY AT 9AM   hydrocortisone 2.5 % cream Apply 1 application topically as needed.   losartan  (COZAAR ) 50 MG tablet TAKE ONE TABLET (50 MG) BY MOUTH TWICE DAILY @ 9AM & 5PM   nystatin  cream (MYCOSTATIN ) Apply topically 3 (three) times daily.   pantoprazole  (PROTONIX ) 40 MG tablet TAKE ONE TABLET (40MG ) BY MOUTH DAILY AT 9AM   rosuvastatin  (CRESTOR ) 5 MG tablet TAKE ONE TABLET BY MOUTH DAILY AT 5PM   Semaglutide ,0.25 or 0.5MG /DOS, 2 MG/3ML SOPN Inject 0.25 mg into the skin once a week.   VENTOLIN  HFA 108 (90 Base) MCG/ACT inhaler INHALE 2 PUFFS INTO THE LUNGS EVERY 6 HOURS AS NEEDED FOR WHEEZING OR SHORTNESS OF BREATH   Vitamin D , Ergocalciferol , (DRISDOL ) 1.25 MG (50000 UNIT) CAPS capsule TAKE 1 CAPSULE BY MOUTH EVERY MONDAY AT 9AM   No facility-administered encounter medications on file as of  07/14/2023.    Surgical History: Past Surgical History:  Procedure Laterality Date   BREAST BIOPSY Left 2000   neg   BREAST SURGERY     CATARACT EXTRACTION W/PHACO Left 06/30/2022   Procedure: CATARACT EXTRACTION PHACO AND INTRAOCULAR LENS PLACEMENT (IOC) LEFT DIABETIC 10.40 01:05.1;  Surgeon: Mittie Gaskin, MD;  Location: [facility];  Service: Ophthalmology;  Laterality: Left;   CATARACT EXTRACTION W/PHACO Right 07/14/2022   Procedure: CATARACT EXTRACTION PHACO AND INTRAOCULAR LENS PLACEMENT (IOC) RIGHT DIABETIC 13.38 01:16.9;  Surgeon: Mittie Gaskin, MD;  Location: [facility];  Service: Ophthalmology;  Laterality: Right;   CHOLECYSTECTOMY     COLONOSCOPY WITH PROPOFOL  N/A 01/31/2015   Procedure: COLONOSCOPY WITH PROPOFOL ;  Surgeon: Gladis RAYMOND Mariner, MD;  Location: [facility];  Service: Endoscopy;  Laterality: N/A;   TONSILLECTOMY      Medical History: Past Medical History:  Diagnosis Date   GERD (gastroesophageal reflux disease)    Hypertension    Obesity    Pre-diabetes    Protein in urine     Family History: Family History  Problem Relation Age of Onset   Breast cancer Paternal Aunt    Hypertension Mother    Heart disease Father     Social History   Socioeconomic History   Marital status: Married    Spouse name: Not on file   Number of children: Not on file   Years of education: Not on file   Highest education level: Not on file  Occupational History   Not on file  Tobacco Use   Smoking status: Never   Smokeless tobacco: Never  Substance and Sexual Activity   Alcohol use: No   Drug use: No   Sexual activity: Not on file  Other Topics Concern   Not on file  Social History Narrative   Not on file   Social Drivers of Health   Financial Resource Strain: Low Risk  (04/16/2020)   Overall Financial Resource Strain (CARDIA)    Difficulty of Paying Living Expenses: Not hard at all  Food Insecurity: Not on file  Transportation  Needs: Not on file  Physical Activity: Not on file  Stress: Not on file  Social Connections: Not on file  Intimate Partner Violence: Not on file      Review of Systems  Constitutional:  Negative for chills, fatigue and unexpected weight change.  HENT:  Negative for congestion, rhinorrhea, sneezing and sore throat.   Eyes:  Negative for redness.  Respiratory:  Negative for cough, chest tightness and shortness of breath.   Cardiovascular:  Negative for  chest pain and palpitations.  Gastrointestinal:  Negative for abdominal pain, constipation, diarrhea, nausea and vomiting.  Genitourinary:  Negative for dysuria and frequency.  Musculoskeletal:  Positive for arthralgias and myalgias. Negative for back pain, joint swelling and neck pain.  Skin:  Negative for rash.  Neurological: Negative.  Negative for tremors and numbness.  Hematological:  Negative for adenopathy. Does not bruise/bleed easily.  Psychiatric/Behavioral:  Positive for sleep disturbance. Negative for behavioral problems (Depression) and suicidal ideas. The patient is not nervous/anxious.     Vital Signs: BP (!) 141/97   Pulse 87   Temp 98.3 F (36.8 C)   Resp 16   Ht 5' (1.524 m)   Wt 200 lb 12.8 oz (91.1 kg)   SpO2 95%   BMI 39.22 kg/m    Physical Exam Vitals and nursing note reviewed.  Constitutional:      General: She is not in acute distress.    Appearance: Normal appearance. She is well-developed. She is obese. She is not diaphoretic.  HENT:     Head: Normocephalic and atraumatic.  Neck:     Thyroid : No thyromegaly.     Vascular: No JVD.     Trachea: No tracheal deviation.  Cardiovascular:     Rate and Rhythm: Normal rate and regular rhythm.     Heart sounds: Normal heart sounds.  Pulmonary:     Effort: Pulmonary effort is normal.  Chest:  Breasts:    Right: Normal. No mass.     Left: Normal. No mass.  Musculoskeletal:        General: Tenderness present. Normal range of motion.     Cervical  back: Normal range of motion and neck supple.     Comments: Mildly tender along right side, no CVA tenderness or RUQ tenderness  Lymphadenopathy:     Cervical: No cervical adenopathy.  Skin:    General: Skin is warm and dry.  Neurological:     Mental Status: She is alert and oriented to person, place, and time.  Psychiatric:        Behavior: Behavior normal.        Thought Content: Thought content normal.        Judgment: Judgment normal.        Assessment/Plan: 1. Encounter for annual wellness exam in Medicare patient (Primary) AWV performed, labs reviewed, mammogram and bone density ordered  2. Essential hypertension Borderline in office but has not yet taken medication.  Normally well-controlled at home we will continue to monitor  3. Visit for screening mammogram - MM 3D SCREENING MAMMOGRAM BILATERAL BREAST; Future  4. Primary ovarian failure - DG Bone Density; Future  5. Trigger finger of right thumb - AMB referral to orthopedics  6. Chronic pain of right knee - AMB referral to orthopedics     General Counseling: kyler lerette understanding of the findings of todays visit and agrees with plan of treatment. I have discussed any further diagnostic evaluation that may be needed or ordered today. We also reviewed her medications today. she has been encouraged to call the office with any questions or concerns that should arise related to todays visit.    Orders Placed This Encounter  Procedures   MM 3D SCREENING MAMMOGRAM BILATERAL BREAST   DG Bone Density   AMB referral to orthopedics    No orders of the defined types were placed in this encounter.   Return for after test is done.   Total time spent:35 Minutes Time spent includes review of  chart, medications, test results, and follow up plan with the patient.   [place] Controlled Substance Database was reviewed by me.  This patient was seen by Tinnie Pro, PA-C in collaboration with Dr. Sigrid Bathe  as a part of collaborative care agreement.  Tinnie Pro, PA-C Internal medicine

## 2023-07-18 ENCOUNTER — Other Ambulatory Visit: Payer: Self-pay | Admitting: Physician Assistant

## 2023-07-18 DIAGNOSIS — E559 Vitamin D deficiency, unspecified: Secondary | ICD-10-CM

## 2023-07-19 ENCOUNTER — Telehealth: Payer: Self-pay | Admitting: Physician Assistant

## 2023-07-19 NOTE — Telephone Encounter (Signed)
 Orthopedic referral sent via [facility] to [facility].  Notified patient. Gave telephone # [phone]

## 2023-07-20 NOTE — Procedures (Signed)
 [facility]  Polysomnogram Report Part I                                                               Phone: [phone] Fax: [phone]  Patient Name: Jamie Powers, Jamie Powers Acquisition Number: [id number]  Date of Birth: [birth year] Acquisition Date: 07/14/2023  Referring Physician: Tinnie Pro, PA-C     History: The patient is a 68 year old  who was referred for evaluation of . Medical History: OSA, Essential Hypertension, Prediabetes, B12 Deficiency, Vitamin D  Deficiency, Mixed Hyperlipidemia, Abnormal Thyroid  exam, Other fatigue, Obesity.  Medications: Norvasc , Onetouch Verio w/ device kit, Calcium -Vitamin D , Zyrtec , Voltaren  gel, Jardiance , Prozac , Hydrocortisone 2.5% cream, Cozaar , Mycostatin , Protonix , Crestor , Semaglutide , Ventolin  HFA, Drisdol .  Procedure: This routine overnight polysomnogram was performed on the [facility] 5 using the standard diagnostic protocol. This included 6 channels of EEG, 2 channels of EOG, chin EMG, bilateral anterior tibialis EMG, nasal/oral thermistor, PTAF (nasal pressure transducer), chest and abdominal wall movements, EKG, pulse oximetry and EtCO2 when appropriate.  Description: The total recording time was 490.5 minutes. The total sleep time was 235.0 minutes. There were a total of 161.5 minutes of wakefulness after sleep onset for a poorsleep efficiency of 47.9%. The latency to sleep onset was prolonged at 94.0 minutes. The R sleep onset latency was N/A. Sleep parameters, as a percentage of the total sleep time, demonstrated 19.8% of sleep was in N1 sleep, 80.2% N2, 0.0% N3 and 0.0% R sleep. There were a total of 130 arousals for an arousal index of 33.2 arousals per hour of sleep that was elevated.  Respiratory monitoring demonstrated   snoring . There were 162 apneas and hypopneas for an Apnea Hypopnea Index of 41.4 apneas and hypopneas per hour of sleep.  The Respiratory Disturbance Index, which includes 2 respiratory effort related arousals  (RERAs), was 41.9 respiratory events per hour of sleep.  The average duration of the respiratory events was 14.7 seconds with a maximum duration of 28.0 seconds. The respiratory events occurred . The respiratory events were associated with peripheral oxygen desaturations on the average to 92%. The lowest oxygen desaturation associated with a respiratory event was 86%. Additionally, the baseline oxygen saturation during wakefulness was 98%, during NREM sleep averaged 98%, and during REM sleep averaged N/A. The total duration of oxygen < 90% was 5.6 minutes and <80% was 1.6 minutes.  Cardiac monitoring-  demonstrate transient cardiac decelerations associated with the apneas.  significant cardiac rhythm irregularities.   Periodic limb movement monitoring- demonstrated that there were 103 periodic limb movements for a periodic limb movement index of 26.3 periodic limb movements per hour of sleep.   Impression: This routine overnight polysomnogram demonstrated significant obstructive sleep apnea with an overall Apnea Hypopnea Index of 41.4 apneas and hypopneas per hour of sleep, while the respiratory disturbance index, which includes RERAs, was 41.9/hr. The respiratory events were associated with peripheral oxygen desaturations on the average to 92% with the lowest desaturation to 86%.    There was a significantly elevated periodic limb movement index of 26.3 periodic limb movements per hour of sleep.   poor sleep efficiency elevated arousal indexincreased awakeningsfailure to progress into the deeper stages of sleep.These findings would appear to be due to the combination of obstructive sleep apnea,  periodic limb movements and psychophysiological factors. In general, these may include, first night affect, pain, anxiety, depression or circadian influences. Clinical correlation would be suggested. Separate treatment for periodic limb movements may be suggested if clinically indicated.  Recommendations:  A  CPAP titration would be recommended due to the severity of the sleep apnea. Additionally, would recommend weight loss in a patient with a BMI of 38.7 lb/in2     Elfreda RONAL Bathe, MD, [facility] Diplomate ABMS-Pulmonary, Critical Care and Sleep Medicine  Electronically reviewed and digitally signed  [facility] Polysomnogram Report Part II  Phone: [phone] Fax: [phone]  Patient last name Hevia Neck Size 15.0 in. Acquisition [id number]  Patient first name Jamie Powers. Weight 198.0 lbs. Started 07/14/2023 at 10:07:17 PM  Birth date 11-24-1955 Height 60.0 in. Stopped 07/15/2023 at 6:31:17 AM  Age 7 BMI 38.7 lb/in2 Duration 490.5  Study Type Adult      Raford Sprang, RPSGT & [facility] [place]   Reviewed by Duwaine Book, RPSGT Sleep Data: Lights Out: 10:15:17 PM Sleep Onset: 11:49:17 PM  Lights On: 6:25:47 AM Sleep Efficiency: 47.9 %  Total Recording Time: 490.5 min Sleep Latency (from Lights Off) 94.0 min  Total Sleep Time (TST): 235.0 min R Latency (from Sleep Onset): N/A  Sleep Period Time: 396.5 min Total number of awakenings: 28  Wake during sleep: 161.5 min Wake After Sleep Onset (WASO): 161.5 min   Sleep Data:         Arousal Summary: Stage  Latency from lights out (min) Latency from sleep onset (min) Duration (min) % Total Sleep Time  Normal values  N 1 103.5 9.5 46.5 19.8 (5%)  N 2 94.0 0.0 188.5 80.2 (50%)  N 3       0.0 0.0 (20%)  R N/A N/A 0.0 0.0 (25%)   Number Index  Spontaneous 45 11.5  Apneas & Hypopneas 53 13.5  RERAs 2 0.5       (Apneas & Hypopneas & RERAs)  (55) (14.0)  Limb Movement 30 7.7  Snore 0 0.0  TOTAL 130 33.2     Respiratory Data:  CA OA MA Apnea Hypopnea* A+ H RERA Total  Number 0 141 0 141 21 162 2 164  Mean Dur (sec) 0.0 14.6 0.0 14.6 15.7 14.7 12.8 14.7  Max Dur (sec) 0.0 23.5 0.0 23.5 28.0 28.0 15.0 28.0  Total Dur (min) 0.0 34.2 0.0 34.2 5.5 39.7 0.4 40.1  % of TST 0.0 14.6 0.0 14.6 2.3 16.9 0.2 17.1  Index (#/h TST) 0.0 36.0  0.0 36.0 5.4 41.4 0.5 41.9  *Hypopneas scored based on 4% or greater desaturation.  Sleep Stage:     Body Position Data:   REM NREM TST  AHI N/A 41.1 41.4  RDI N/A 41.6 41.9         Sleep (min) TST (%) REM (min) NREM (min) CA (#) OA (#) MA (#) HYP (#) AHI (#/h) RERA (#) RDI (#/h) Desat (#)  Supine 111.5 47.45 0.0 111.5 0 53 0 3 30.1 2 31.2 13  Non-Supine 123.50 52.55 0.00 123.50 0.00 88.00 0.00 18.00 51.50 0 51.50 97.00  Prone: 0.0 0.00 0.0 0.0 0 0 0 0 0.0 0 0.00 0  Right: 123.5 52.55 0.0 123.5 0 88 0 18 51.5 0 51.5 97       Snoring: Total number of snoring episodes  0  Total time with snoring    min (   % of sleep)   Oximetry Distribution:  WK REM NREM TOTAL  Average (%)   98    98 98  < 90% 4.4 0.0 1.2 5.6  < 80% 1.6 0.0 0.0 1.6  < 70% 1.1 0.0 0.0 1.1  # of Desaturations* 2 0 109 111  Desat Index (#/hour) 0.5    27.8 28.3  Desat Max (%) 7 0 11 11  Desat Max Dur (sec) 16.0 0.0 36.0 36.0  Approx Min O2 during sleep 86  Approx min O2 during a respiratory event 86  Was Oxygen added (Y/N) and final rate :    LPM  *Desaturations based on 4% or greater drop from baseline.   Cheyne Stokes Breathing: None Present   Hypoventilation: None Present    Heart Rate Summary:  Average Heart Rate During Sleep 81.7 bpm      Highest Heart Rate During Sleep (95th %) 91.0 bpm      Highest Heart Rate During Sleep 169 bpm (artifact)  Highest Heart Rate During Recording (TIB) 217 bpm (artifact)   Heart Rate Observations: Event Type # Events   Bradycardia 0 Lowest HR Scored: N/A  Sinus Tachycardia During Sleep 0 Highest HR Scored: N/A  Narrow Complex Tachycardia 0 Highest HR Scored: N/A  Wide Complex Tachycardia 0 Highest HR Scored: N/A  Asystole 0 Longest Pause: N/A  Atrial Fibrillation 0 Duration Longest Event: N/A  Other Arrythmias   Type:    Periodic Limb Movement Data: (Primary legs unless otherwise noted) Total # Limb Movement 131 Limb Movement Index  33.4  Total # PLMS 103 PLMS Index 26.3  Total # PLMS Arousals 13 PLMS Arousal Index 3.3  Percentage Sleep Time with PLMS 62.68min (26.8 % sleep)  Mean Duration limb movements (secs) 419.6

## 2023-07-25 ENCOUNTER — Telehealth: Payer: Self-pay | Admitting: Physician Assistant

## 2023-07-25 NOTE — Addendum Note (Signed)
 Addended by: Basilio Meadow on: 07/25/2023 12:24 PM   Modules accepted: Orders

## 2023-07-25 NOTE — Telephone Encounter (Signed)
 Notified patient of mammogram & dexa appointment date, arrival time, location. Instructed patient, no calcium  supplements day of appt-[facility]

## 2023-07-26 ENCOUNTER — Other Ambulatory Visit: Payer: Self-pay | Admitting: *Deleted

## 2023-07-26 ENCOUNTER — Inpatient Hospital Stay
Admission: RE | Admit: 2023-07-26 | Discharge: 2023-07-26 | Disposition: A | Discharge status: Home / Self Care | Payer: Self-pay | Source: Ambulatory Visit | Attending: Physician Assistant

## 2023-07-26 ENCOUNTER — Inpatient Hospital Stay
Admission: RE | Admit: 2023-07-26 | Discharge: 2023-07-26 | Disposition: A | Discharge status: Home / Self Care | Payer: Self-pay | Source: Ambulatory Visit | Attending: Physician Assistant | Admitting: Physician Assistant

## 2023-07-26 DIAGNOSIS — Z1231 Encounter for screening mammogram for malignant neoplasm of breast: Secondary | ICD-10-CM

## 2023-07-27 ENCOUNTER — Encounter (INDEPENDENT_AMBULATORY_CARE_PROVIDER_SITE_OTHER): Payer: Self-pay | Admitting: Internal Medicine

## 2023-07-27 DIAGNOSIS — G4733 Obstructive sleep apnea (adult) (pediatric): Secondary | ICD-10-CM | POA: Diagnosis not present

## 2023-08-11 ENCOUNTER — Telehealth: Payer: Self-pay | Admitting: Physician Assistant

## 2023-08-15 ENCOUNTER — Other Ambulatory Visit: Payer: Self-pay | Admitting: Physician Assistant

## 2023-08-15 DIAGNOSIS — R109 Unspecified abdominal pain: Secondary | ICD-10-CM

## 2023-08-15 DIAGNOSIS — M65311 Trigger thumb, right thumb: Secondary | ICD-10-CM

## 2023-08-15 DIAGNOSIS — M791 Myalgia, unspecified site: Secondary | ICD-10-CM

## 2023-08-15 DIAGNOSIS — G8929 Other chronic pain: Secondary | ICD-10-CM

## 2023-08-16 ENCOUNTER — Telehealth: Payer: Self-pay | Admitting: Physician Assistant

## 2023-08-16 NOTE — Telephone Encounter (Signed)
 error

## 2023-08-16 NOTE — Telephone Encounter (Signed)
 Updated orthopedic referral including right-sided pain sent via [facility] to [facility]

## 2023-08-17 ENCOUNTER — Telehealth: Payer: Self-pay | Admitting: Physician Assistant

## 2023-08-17 NOTE — Telephone Encounter (Signed)
 Orthopedic appointment 08/24/2023 @ [facility]

## 2023-08-17 NOTE — Procedures (Signed)
 [facility]  Polysomnogram Report Part I  Phone: [phone] Fax: [phone]  Patient Name: Jamie Powers, Jamie Powers Acquisition Number: [id number]  Date of Birth: 26-Jun-1955 Acquisition Date: 07/27/2023  Referring Physician: Tinnie Pro, PA-C     History: The patient is a 68 year old  . Medical History: GERD, hypertension, obesity, pre-diabetes, OSA  Medications: amlodipine , OneTouch Verio w/ device kit, Calcium -Vitamin D , Zyrtec , Voltaren  gel, Jardiance , Prozac , hydrocortisone 2.5% cream, Cozaar , mycostatin , Protonix , Crestor , Semaglutide , Ventolin  HFA, vitamin D  ergocalciferol .  Procedure: This routine overnight polysomnogram was performed on the Alice 5 using the standard CPAP protocol. This included 6 channels of EEG, 2 channels of EOG, chin EMG, bilateral anterior tibialis EMG, nasal/oral thermistor, PTAF (nasal pressure transducer), chest and abdominal wall movements, EKG, and pulse oximetry.  Description: The total recording time was 437.0 minutes. The total sleep time was 303.0 minutes. There were a total of 66.0 minutes of wakefulness after sleep onset for a reducedsleep efficiency of 69.3%. The latency to sleep onset was prolonged at 68.0 minutes. The R sleep onset latency wasprolonged at 266.0 minutes. Sleep parameters, as a percentage of the total sleep time, demonstrated 5.1% of sleep was in N1 sleep, 64.0% N2, 1.7% N3 and 29.2% R sleep. There were a total of 104 arousals for an arousal index of 20.6 arousals per hour of sleep that was elevated.  Overall, there were a total of 39 respiratory events for a respiratory disturbance index, which includes apneas, hypopneas and RERAs (increased respiratory effort) of 7.7 respiratory events per hour of sleep during the pressure titration.  was initiated at 4 cm H2O at lights out, 10:54 p.m. It was titrated in 1 cm increments for REM-related hypopneas to 7 cm H2O. The apnea was controlled at this pressure. The pressure was further  titrated to the final pressure of 8 cm H2O. Supine, REM sleep was not observed.  Additionally, the baseline oxygen saturation during wakefulness was 97%, during NREM sleep averaged 95%, and during REM sleep averaged 95%. The total duration of oxygen < 90% was 1.0 minutes.  Cardiac monitoring- intermittent, multi-focal PVCs were observed.   Periodic limb movement monitoring- demonstrated that there were 8 periodic limb movements for a periodic limb movement index of 1.6 periodic limb movements per hour of sleep.    Impression: This patient's obstructive sleep apnea demonstrated significant improvement with the utilization of nasal  at 7 cm H2O in REM sleep and at 5 cm H2O in supine sleep. Supine, REM sleep was not observed.  Limited EKG monitoring demonstrated. intermittent, multi-focal PVCs. Cardiac evaluation is suggested.  Recommendations:  Recommend utilization of auto-adjusting  at 5-15 cm H2O.      A P10 - MED for Her mask was used. Chin strap used during study- no. Humidifier used during study- yes.     Jamie RONAL Bathe, MD, [facility] Diplomate ABMS-Pulmonary, Critical Care and Sleep Medicine  Electronically reviewed and digitally signed  [facility] CPAP/BIPAP Polysomnogram Report Part II Phone: [phone] Fax: [phone]  Patient last name Jamie Powers Neck Size 15.0 in. Acquisition [id number]  Patient first name Jamie Powers. Weight 198.0 lbs. Started 07/27/2023 at 10:49:01 PM  Birth date Nov 27, 1955 Height 60.0 in. Stopped 07/28/2023 at 6:17:25 AM  Age 73      Type Adult BMI 38.7 lb/in2 Duration 437.0  Jamie Powers,  RPSGT & Jamie Powers  Reviewed by: Jamie MATSU. Colvin, PhD, ABSM, FAASM Sleep Data: Lights Out: 10:54:01 PM Sleep Onset: 12:02:01 AM  Lights On: 6:11:01  AM Sleep Efficiency: 69.3 %  Total Recording Time: 437.0 min Sleep Latency (from Lights Off) 68.0 min  Total Sleep Time (TST): 303.0 min R Latency (from Sleep Onset): 266.0 min  Sleep Period Time: 369.0 min  Total number of awakenings: 12  Wake during sleep: 66.0 min Wake After Sleep Onset (WASO): 66.0 min   Sleep Data:         Arousal Summary: Stage  Latency from lights out (min) Latency from sleep onset (min) Duration (min) % Total Sleep Time  Normal values  N 1 68.0 0.0 15.5 5.1 (5%)  N 2 69.5 1.5 194.0 64.0 (50%)  N 3 169.0 101.0 5.0 1.7 (20%)  R 334.0 266.0 88.5 29.2 (25%)   Number Index  Spontaneous 97 19.2  Apneas & Hypopneas 1 0.2  RERAs 0 0.0       (Apneas & Hypopneas & RERAs)  (1) (0.2)  Limb Movement 10 2.0  Snore 0 0.0  TOTAL 108 21.4     Respiratory Data:  CA OA MA Apnea Hypopnea* A+ H RERA Total  Number 0 0 0 0 18 18 0 18  Mean Dur (sec) 0.0 0.0 0.0 0.0 22.8 22.8 0.0 22.8  Max Dur (sec) 0.0 0.0 0.0 0.0 64.5 64.5 0.0 64.5  Total Dur (min) 0.0 0.0 0.0 0.0 6.8 6.8 0.0 6.8  % of TST 0.0 0.0 0.0 0.0 2.3 2.3 0.0 2.3  Index (#/h TST) 0.0 0.0 0.0 0.0 3.6 3.6 0.0 3.6  *Hypopneas scored based on 4% or greater desaturation.  Sleep Stage:         REM NREM TST  AHI 8.8 1.4 3.6  RDI 8.8 1.4 3.6   Sleep (min) TST (%) REM (min) NREM (min) CA (#) OA (#) MA (#) HYP (#) AHI (#/h) RERA (#) RDI (#/h) Desat (#)  Supine 119.6 39.47 0.0 119.6 0 0 0 3 1.5 0 1.5 11  Non-Supine 183.40 60.53 88.50 94.90 0.00 0.00 0.00 15.00 4.91 0 4.91 48.00  Left: 103.5 34.16 88.5 15.0 0 0 0 15 8.7 0 8.7 39  Right: 79.9 26.37 0.0 79.9 0 0 0 0 0.0 0 0.00 9     Snoring: Total number of snoring episodes  0  Total time with snoring    min (   % of sleep)   Oximetry Distribution:             WK REM NREM TOTAL  Average (%)   97 95 95 96  < 90% 0.0 1.0 0.0 1.0  < 80% 0.0 0.0 0.0 0.0  < 70% 0.0 0.0 0.0 0.0  # of Desaturations* 1 37 21 59  Desat Index (#/hour) 0.4 25.1 5.9 11.7  Desat Max (%) 3 10 6 10   Desat Max Dur (sec) 16.0 72.0 76.0 76.0  Approx Min O2 during sleep 85  Approx min O2 during a respiratory event 85  Was Oxygen added (Y/N) and final rate :    LPM  *Desaturations  based on 3% or greater drop from baseline.   Cheyne Stokes Breathing: None Present   Heart Rate Summary:  Average Heart Rate During Sleep 68.8 bpm      Highest Heart Rate During Sleep (95th %) 80.0 bpm      Highest Heart Rate During Sleep 159 bpm (artifact)  Highest Heart Rate During Recording (TIB) 237 bpm (artifact)   Heart Rate Observations: Event Type # Events   Bradycardia 0 Lowest HR Scored: N/A  Sinus Tachycardia During Sleep 0 Highest  HR Scored: N/A  Narrow Complex Tachycardia 0 Highest HR Scored: N/A  Wide Complex Tachycardia 0 Highest HR Scored: N/A  Asystole 0 Longest Pause: N/A  Atrial Fibrillation 0 Duration Longest Event: N/A  Other Arrythmias   Type:   Periodic Limb Movement Data: (Primary legs unless otherwise noted) Total # Limb Movement 16 Limb Movement Index 3.2  Total # PLMS 8 PLMS Index 1.6  Total # PLMS Arousals 4 PLMS Arousal Index 0.8  Percentage Sleep Time with PLMS 4.57min (1.6 % sleep)  Mean Duration limb movements (secs) 284.0    IPAP Level (cmH2O) EPAP Level (cmH2O) Total Duration (min) Sleep Duration (min) Sleep (%) REM (%) CA  #) OA # MA # HYP #) AHI (#/hr) RERAs # RERAs (#/hr) RDI (#/hr)  4 4 66.4 25.0 37.7 0.0 0 0 0 0 0.0 0 0.0 0.0  5 5 212.6 195.3 91.9 6.3 0 0 0 13 4.0 0 0.0 4.0  6 6 43.6 43.6 100.0 100.0 0 0 0 5 6.9 0 0.0 6.9  7 7  19.7 19.7 100.0 100.0 0 0 0 0 0.0 0 0.0 0.0  8 8 25.4 18.4 72.4 42.9 0 0 0 0 0.0 0 0.0 0.0

## 2023-08-24 DIAGNOSIS — M65311 Trigger thumb, right thumb: Secondary | ICD-10-CM | POA: Diagnosis not present

## 2023-09-02 DIAGNOSIS — G4733 Obstructive sleep apnea (adult) (pediatric): Secondary | ICD-10-CM | POA: Diagnosis not present

## 2023-09-06 ENCOUNTER — Other Ambulatory Visit

## 2023-09-06 ENCOUNTER — Ambulatory Visit
Admission: RE | Admit: 2023-09-06 | Discharge: 2023-09-06 | Disposition: A | Discharge status: Home / Self Care | Source: Ambulatory Visit | Attending: Physician Assistant | Admitting: Physician Assistant

## 2023-09-06 ENCOUNTER — Encounter

## 2023-09-06 DIAGNOSIS — M8589 Other specified disorders of bone density and structure, multiple sites: Secondary | ICD-10-CM | POA: Diagnosis not present

## 2023-09-06 DIAGNOSIS — E2839 Other primary ovarian failure: Secondary | ICD-10-CM | POA: Diagnosis not present

## 2023-09-06 DIAGNOSIS — Z1231 Encounter for screening mammogram for malignant neoplasm of breast: Secondary | ICD-10-CM

## 2023-09-06 DIAGNOSIS — Z13820 Encounter for screening for osteoporosis: Secondary | ICD-10-CM | POA: Diagnosis not present

## 2023-09-14 DIAGNOSIS — M25561 Pain in right knee: Secondary | ICD-10-CM | POA: Diagnosis not present

## 2023-09-14 DIAGNOSIS — M25861 Other specified joint disorders, right knee: Secondary | ICD-10-CM | POA: Diagnosis not present

## 2023-09-14 DIAGNOSIS — M1711 Unilateral primary osteoarthritis, right knee: Secondary | ICD-10-CM | POA: Diagnosis not present

## 2023-09-14 DIAGNOSIS — G8929 Other chronic pain: Secondary | ICD-10-CM | POA: Diagnosis not present

## 2023-09-19 DIAGNOSIS — R809 Proteinuria, unspecified: Secondary | ICD-10-CM | POA: Diagnosis not present

## 2023-09-19 DIAGNOSIS — I10 Essential (primary) hypertension: Secondary | ICD-10-CM | POA: Diagnosis not present

## 2023-09-29 DIAGNOSIS — N181 Chronic kidney disease, stage 1: Secondary | ICD-10-CM | POA: Diagnosis not present

## 2023-09-29 DIAGNOSIS — R809 Proteinuria, unspecified: Secondary | ICD-10-CM | POA: Diagnosis not present

## 2023-09-29 DIAGNOSIS — I10 Essential (primary) hypertension: Secondary | ICD-10-CM | POA: Diagnosis not present

## 2023-10-03 DIAGNOSIS — G4733 Obstructive sleep apnea (adult) (pediatric): Secondary | ICD-10-CM | POA: Diagnosis not present

## 2023-10-26 ENCOUNTER — Ambulatory Visit: Payer: Self-pay | Admitting: Physician Assistant

## 2023-11-03 ENCOUNTER — Ambulatory Visit (INDEPENDENT_AMBULATORY_CARE_PROVIDER_SITE_OTHER): Admitting: Physician Assistant

## 2023-11-03 ENCOUNTER — Encounter: Payer: Self-pay | Admitting: Physician Assistant

## 2023-11-03 VITALS — BP 110/68 | HR 83 | Temp 98.3°F | Resp 16 | Ht 60.0 in | Wt 208.6 lb

## 2023-11-03 DIAGNOSIS — Z7189 Other specified counseling: Secondary | ICD-10-CM | POA: Diagnosis not present

## 2023-11-03 DIAGNOSIS — G4733 Obstructive sleep apnea (adult) (pediatric): Secondary | ICD-10-CM

## 2023-11-03 DIAGNOSIS — I10 Essential (primary) hypertension: Secondary | ICD-10-CM

## 2023-11-03 DIAGNOSIS — M8580 Other specified disorders of bone density and structure, unspecified site: Secondary | ICD-10-CM | POA: Diagnosis not present

## 2023-11-03 MED ORDER — ZEPBOUND 2.5 MG/0.5ML ~~LOC~~ SOAJ: 2.5000 mg | SUBCUTANEOUS | 2 refills | Status: DC

## 2023-11-03 NOTE — Progress Notes (Signed)
 [facility] [address] [place], [place] [address]  Internal MEDICINE  Office Visit Note  Patient Name: Jamie Powers  [id number]  [id number]  Date of Service: 11/03/2023  Chief Complaint  Patient presents with   Follow-up    Review SS   Gastroesophageal Reflux   Hypertension    HPI Pt is here for routine follow up -Pt had titration sleep study and was recommended to start APAP 5-15cm H2O -Started on CPAP about 1 month ago for severe OSA, will request access for download -would like to try for zepbound due to severe OSA, no contraindication -mammogram UTD -Bone density: osteopenia, with low fracture risk. Will continue Vit D -Bp stable  Current Medication: Outpatient Encounter Medications as of 11/03/2023  Medication Sig   amLODipine  (NORVASC ) 2.5 MG tablet TAKE ONE TABLET BY MOUTH DAILY AT 9AM   Blood Glucose Monitoring Suppl (ONETOUCH VERIO) w/Device KIT Use as directed. E11.9   CALCIUM -VITAMIN D  PO Take by mouth.   celecoxib (CELEBREX) 50 MG capsule Take 50 mg by mouth as needed for pain.   cetirizine  (ZYRTEC ) 10 MG tablet TAKE ONE TABLET BY MOUTH DAILY AT 9AM   chlorpheniramine-HYDROcodone (TUSSIONEX) 10-8 MG/5ML Take 5 mLs by mouth every 12 (twelve) hours as needed for cough.   diclofenac  Sodium (VOLTAREN ) 1 % GEL Apply 4 g topically 4 (four) times daily.   empagliflozin  (JARDIANCE ) 25 MG TABS tablet Take 25 mg by mouth daily.   FLUoxetine  (PROZAC ) 10 MG capsule TAKE ONE CAPSULE BY MOUTH DAILY AT 9AM   hydrocortisone 2.5 % cream Apply 1 application topically as needed.   losartan  (COZAAR ) 50 MG tablet TAKE ONE TABLET (50 MG) BY MOUTH TWICE DAILY @ 9AM & 5PM   nystatin  cream (MYCOSTATIN ) Apply topically 3 (three) times daily.   pantoprazole  (PROTONIX ) 40 MG tablet TAKE ONE TABLET (40MG ) BY MOUTH DAILY AT 9AM   rosuvastatin  (CRESTOR ) 5 MG tablet TAKE ONE TABLET BY MOUTH DAILY AT 5PM   Semaglutide ,0.25 or 0.5MG /DOS, 2 MG/3ML SOPN Inject 0.25 mg into the  skin once a week.   VENTOLIN  HFA 108 (90 Base) MCG/ACT inhaler INHALE 2 PUFFS INTO THE LUNGS EVERY 6 HOURS AS NEEDED FOR WHEEZING OR SHORTNESS OF BREATH   Vitamin D , Ergocalciferol , (DRISDOL ) 1.25 MG (50000 UNIT) CAPS capsule TAKE 1 CAPSULE BY MOUTH EVERY MONDAY AT 9AM   No facility-administered encounter medications on file as of 11/03/2023.    Surgical History: Past Surgical History:  Procedure Laterality Date   BREAST EXCISIONAL BIOPSY Left    Neg years ago   CATARACT EXTRACTION W/PHACO Left 06/30/2022   Procedure: CATARACT EXTRACTION PHACO AND INTRAOCULAR LENS PLACEMENT (IOC) LEFT DIABETIC 10.40 01:05.1;  Surgeon: Mittie Gaskin, MD;  Location: [facility];  Service: Ophthalmology;  Laterality: Left;   CATARACT EXTRACTION W/PHACO Right 07/14/2022   Procedure: CATARACT EXTRACTION PHACO AND INTRAOCULAR LENS PLACEMENT (IOC) RIGHT DIABETIC 13.38 01:16.9;  Surgeon: Mittie Gaskin, MD;  Location: [facility];  Service: Ophthalmology;  Laterality: Right;   CHOLECYSTECTOMY     COLONOSCOPY WITH PROPOFOL  N/A 01/31/2015   Procedure: COLONOSCOPY WITH PROPOFOL ;  Surgeon: Gladis RAYMOND Mariner, MD;  Location: [facility];  Service: Endoscopy;  Laterality: N/A;   TONSILLECTOMY      Medical History: Past Medical History:  Diagnosis Date   GERD (gastroesophageal reflux disease)    Hypertension    Obesity    Pre-diabetes    Protein in urine     Family History: Family History  Problem Relation Age of  Onset   Breast cancer Paternal Aunt    Hypertension Mother    Heart disease Father     Social History   Socioeconomic History   Marital status: Married    Spouse name: Not on file   Number of children: Not on file   Years of education: Not on file   Highest education level: Not on file  Occupational History   Not on file  Tobacco Use   Smoking status: Never   Smokeless tobacco: Never  Substance and Sexual Activity   Alcohol use: No   Drug use: No    Sexual activity: Not on file  Other Topics Concern   Not on file  Social History Narrative   Not on file   Social Drivers of Health   Financial Resource Strain: Medium Risk (10/27/2023)   Received from [facility]   Overall Financial Resource Strain (CARDIA)    Difficulty of Paying Living Expenses: Somewhat hard  Food Insecurity: No Food Insecurity (10/27/2023)   Received from [facility]   Hunger Vital Sign    Within the past 12 months, you worried that your food would run out before you got the money to buy more.: Never true    Within the past 12 months, the food you bought just didn't last and you didn't have money to get more.: Never true  Transportation Needs: No Transportation Needs (10/27/2023)   Received from [facility] - Transportation    In the past 12 months, has lack of transportation kept you from medical appointments or from getting medications?: No    Lack of Transportation (Non-Medical): No  Physical Activity: Not on file  Stress: Not on file  Social Connections: Not on file  Intimate Partner Violence: Not on file      Review of Systems  Constitutional:  Negative for chills, fatigue and unexpected weight change.  HENT:  Negative for congestion, rhinorrhea, sneezing and sore throat.   Eyes:  Negative for redness.  Respiratory:  Negative for cough, chest tightness and shortness of breath.   Cardiovascular:  Negative for chest pain and palpitations.  Gastrointestinal:  Negative for abdominal pain, constipation, diarrhea, nausea and vomiting.  Genitourinary:  Negative for dysuria and frequency.  Musculoskeletal:  Positive for arthralgias and myalgias. Negative for back pain, joint swelling and neck pain.  Skin:  Negative for rash.  Neurological: Negative.  Negative for tremors and numbness.  Hematological:  Negative for adenopathy. Does not bruise/bleed easily.  Psychiatric/Behavioral:  Positive for  sleep disturbance. Negative for behavioral problems (Depression) and suicidal ideas. The patient is not nervous/anxious.     Vital Signs: BP 110/68   Pulse 83   Temp 98.3 F (36.8 C)   Resp 16   Ht 5' (1.524 m)   Wt 208 lb 9.6 oz (94.6 kg)   SpO2 96%   BMI 40.74 kg/m    Physical Exam Vitals and nursing note reviewed.  Constitutional:      General: She is not in acute distress.    Appearance: Normal appearance. She is well-developed. She is obese. She is not diaphoretic.  HENT:     Head: Normocephalic and atraumatic.  Neck:     Thyroid : No thyromegaly.     Vascular: No JVD.     Trachea: No tracheal deviation.  Cardiovascular:     Rate and Rhythm: Normal rate and regular rhythm.     Heart sounds: Normal heart sounds.  Pulmonary:  Effort: Pulmonary effort is normal.  Chest:  Breasts:    Right: Normal. No mass.     Left: Normal. No mass.  Musculoskeletal:     Cervical back: Normal range of motion and neck supple.  Lymphadenopathy:     Cervical: No cervical adenopathy.  Skin:    General: Skin is warm and dry.  Neurological:     Mental Status: She is alert and oriented to person, place, and time.     Cranial Nerves: No cranial nerve deficit.  Psychiatric:        Behavior: Behavior normal.        Thought Content: Thought content normal.        Judgment: Judgment normal.        Assessment/Plan: 1. OSA (obstructive sleep apnea) (Primary) Continue CPAP nightly and will try starting zepbound for severe OSA - tirzepatide (ZEPBOUND) 2.5 MG/0.5ML Pen; Inject 2.5 mg into the skin once a week.  Dispense: 2 mL; Refill: 2  2. CPAP use counseling CPAP couseling-Discussed importance of adequate CPAP use as well as proper care and cleaning techniques of machine and all supplies.  3. Osteopenia, unspecified location Will continue to optimize Vit D and stay active  4. Essential hypertension Well controlled, continue current medication   General Counseling: Anuja  verbalizes understanding of the findings of todays visit and agrees with plan of treatment. I have discussed any further diagnostic evaluation that may be needed or ordered today. We also reviewed her medications today. she has been encouraged to call the office with any questions or concerns that should arise related to todays visit.    No orders of the defined types were placed in this encounter.   No orders of the defined types were placed in this encounter.   This patient was seen by Tinnie Pro, PA-C in collaboration with Dr. Sigrid Bathe as a part of collaborative care agreement.   Total time spent:30 Minutes Time spent includes review of chart, medications, test results, and follow up plan with the patient.      Dr Fozia M Khan Internal medicine

## 2023-11-28 ENCOUNTER — Encounter: Payer: Self-pay | Admitting: Physician Assistant

## 2023-11-28 ENCOUNTER — Ambulatory Visit (INDEPENDENT_AMBULATORY_CARE_PROVIDER_SITE_OTHER): Admitting: Physician Assistant

## 2023-11-28 VITALS — BP 130/84 | HR 78 | Temp 97.8°F | Resp 16 | Ht 60.0 in | Wt 206.0 lb

## 2023-11-28 DIAGNOSIS — I10 Essential (primary) hypertension: Secondary | ICD-10-CM

## 2023-11-28 DIAGNOSIS — G4733 Obstructive sleep apnea (adult) (pediatric): Secondary | ICD-10-CM

## 2023-11-28 DIAGNOSIS — Z7189 Other specified counseling: Secondary | ICD-10-CM | POA: Diagnosis not present

## 2023-11-28 DIAGNOSIS — Z6841 Body Mass Index (BMI) 40.0 and over, adult: Secondary | ICD-10-CM

## 2023-11-28 NOTE — Progress Notes (Signed)
 Chief Complaint: Chief Complaint  Patient presents with  . Right Knee - Follow-up    Jamie Powers is a 68 y.o. female who presents today for evaluation of ongoing right leg and knee pain.  She was last evaluated by me in September of this year where she was diagnosed with right knee osteoarthritic changes in addition to underlying patellofemoral dysfunction.  She was given a prescription of Celebrex and was also given a referral for formal physical.  She states that she has stopped the Celebrex because the pain has stopped for her.  She does feel that her right leg is feeling much better after completion of formal therapy, she does have 1 more therapy session remaining.  She denies any repeat trauma or injury affecting the right knee since her last appointment.  She denies any numbness or tingling to the right lower extremity at today's visit.  She states that she has returned back to her ability to perform activities on a day-to-day basis.  She also reports that  she is not able to go up and down steps without significant limitations.  She is overall very pleased with her current progress she reports a 0-10 pain score.  She is not taking medication for discomfort at this time.  Past Medical History: Past Medical History:  Diagnosis Date  . Hypertension   . Tubular adenoma of colon 01/31/2015    Past Surgical History: Past Surgical History:  Procedure Laterality Date  . COLONOSCOPY  01/31/2015   Dr. EMERSON Mariner @ [facility] - Adenomatous Polyps (3-10mm), FHPolyps(m)(s), rpt 3 yrs per [facility]  . CHOLECYSTECTOMY    . TONSILLECTOMY & ADENOIDECTOMY      Past Family History: Family History  Problem Relation Age of Onset  . Colon polyps Mother   . Colon polyps Sister     Medications: Current Outpatient Medications  Medication Sig Dispense Refill  . tirzepatide  (ZEPBOUND ) 2.5 mg/0.5 mL pen injector Inject 2.5 mg subcutaneously every 7 (seven) days    . amLODIPine  (NORVASC ) 2.5 MG tablet Take  2.5 mg by mouth once daily    . celecoxib (CELEBREX) 200 MG capsule TAKE 1 CAPSULE(200 MG) BY MOUTH TWICE DAILY 180 capsule 1  . cetirizine  (ZYRTEC ) 10 MG tablet Take 10 mg by mouth once daily    . ergocalciferol , vitamin D2, 1,250 mcg (50,000 unit) capsule TAKE 1 CAPSULE BY MOUTH EVERY MONDAY AT 9AM    . FLUoxetine  (PROZAC ) 10 MG capsule TAKE ONE CAPSULE BY MOUTH DAILY AT 9AM    . JARDIANCE  25 mg tablet TAKE ONE TABLET (25MG ) BY MOUTH DAILY AT 9AM IN THE MORNING    . losartan  (COZAAR ) 50 MG tablet Take 50 mg by mouth    . ONETOUCH VERIO FLEX METER Misc USE TO TEST BLOOD GLUCOSE DAILY    . pantoprazole  (PROTONIX ) 40 MG DR tablet TAKE ONE TABLET (40MG ) BY MOUTH DAILY AT 9AM    . rosuvastatin  (CRESTOR ) 5 MG tablet TAKE ONE TABLET BY MOUTH DAILY AT 5PM     No current facility-administered medications for this visit.    Allergies: Allergies  Allergen Reactions  . Penicillins Rash     Review of Systems:  A comprehensive 14 point ROS was performed, reviewed by me today, and the pertinent orthopaedic findings are documented in the HPI.   Exam: BP 128/82   Ht 149.9 cm (4' 11)   Wt 93.7 kg (206 lb 9.6 oz)   BMI 41.73 kg/m  General/Constitutional: The patient appears to be well-nourished,  well-developed, and in no acute distress. Neuro/Psych: Normal mood and affect, oriented to person, place and time. Eyes: Non-icteric.  Pupils are equal, round, and reactive to light, and exhibit synchronous movement. ENT: Unremarkable. Lymphatic: No palpable adenopathy. Respiratory: Non-labored breathing Cardiovascular: No edema, swelling or tenderness, except as noted in detailed exam. Integumentary: No impressive skin lesions present, except as noted in detailed exam. Musculoskeletal: Unremarkable, except as noted in detailed exam.  General: Well developed, well nourished 68 y.o. female in no apparent distress.  Normal affect.  Normal communication.  Patient answers questions appropriately.  The  patient has no limp identified at today's appointment.  Right Lower Extremities: Examination of the right lower extremity reveals no bony abnormality, no edema, mild effusion and no ecchymosis.  There is a mild valgus abnormality to the right knee.  The patient is nontender tender along the lateral joint line, and is minimally tender along the medial joint line.  The patient does have full extension and is able to flex close to 110 degrees.  Crepitus can be palpated however no pain with range of motion at today's appointment.  The patient has a mildly positive rotational Mcmurray test.  There is mild retropatellar discomfort.  The patient has a negative patella stretch test.  The patient has a negative varus stress test and a negative valgus stress test, in looking for stability.  The patient has a negative Lachman's test.  Vascular: The patient has a negative Toula' test bilaterally.  The patient had a normal dorsalis pedis and posterior tibial pulse.  There is normal skin warmth.  There is normal capillary refill bilaterally.    Neurologic: The patient has a negative straight leg raise.  The patient has normal muscle strength testing for the quadriceps, calves, ankle dorsiflexion, ankle plantarflexion, and extensor hallicus longus.  The patient has sensation that is intact to light touch.  The deep tendon reflexes are normal at the patella and achilles.  No clonus is noted.    Imaging: AP, lateral and sunrise views of the right knee were obtained previously in the office and reviewed by me.  These x-rays demonstrate no evidence of acute fracture or dislocation.  There is some underlying osteoarthritic changes involving the right knee.  Increased subchondral sclerosis along the medial compartment of the right knee.  Small osteophyte formation off of the femoral condyle.  On sunrise view there is loss of patellofemoral joint space and the patella is sitting laterally within the patellofemoral  groove.  Impression: Patellofemoral dysfunction of right knee [M25.861] Patellofemoral dysfunction of right knee  (primary encounter diagnosis) Chronic pain of right knee Primary osteoarthritis of right knee Greater trochanteric bursitis of right hip Iliotibial band syndrome of right side  Plan:  1.  Treatment options were discussed today with the patient. 2.  At today's appointment the patient's right leg pain is much improved. 3.  She will continue with formal therapy for at least 1 more session before transitioning to performing primarily activities at home.  She was instructed on the importance of continue to work on strengthening the right leg even after completion of therapy. 4.  The patient will follow-up with me on an as-needed basis at this time. 5.  They can call the clinic they have any questions, new symptoms develop or symptoms worsen.  This office visit took 45 minutes, of which >50% involved patient counseling/education.  Review of the [place] CSRS was performed in accordance of the [employer] prior to dispensing any controlled drugs.  This  note was generated in part with voice recognition software and I apologize for any typographical errors that were not detected and corrected.  DOROTHA Gustavo Level, PA-C, CAQ-OS [facility]

## 2023-11-28 NOTE — Progress Notes (Signed)
 [facility] [address] [place], [place] [address]  Internal MEDICINE  Office Visit Note  Patient Name: Jamie Powers  [id number]  [id number]  Date of Service: 11/28/2023  Chief Complaint  Patient presents with   Follow-up    CPAP download     HPI Pt is here for routine follow up for CPAP  -BP stable -Pt is tolerating and benefiting from CPAP use, just hasn't gotten into habit of wearing nightly -Realizes she needs to use more when discussing download -DME is [facility] ([facility]) -CPAP compliance 09/08/23-11/06/23 Use 9/60 days >4 hours: 11.67% Avg use 7hrs Luna II APAP 5-15cm h2O AHI 0.9  Current Medication: Outpatient Encounter Medications as of 11/28/2023  Medication Sig   amLODipine  (NORVASC ) 2.5 MG tablet TAKE ONE TABLET BY MOUTH DAILY AT 9AM   Blood Glucose Monitoring Suppl (ONETOUCH VERIO) w/Device KIT Use as directed. E11.9   CALCIUM -VITAMIN D  PO Take by mouth.   celecoxib (CELEBREX) 50 MG capsule Take 50 mg by mouth as needed for pain.   cetirizine  (ZYRTEC ) 10 MG tablet TAKE ONE TABLET BY MOUTH DAILY AT 9AM   empagliflozin  (JARDIANCE ) 25 MG TABS tablet Take 25 mg by mouth daily.   FLUoxetine  (PROZAC ) 10 MG capsule TAKE ONE CAPSULE BY MOUTH DAILY AT 9AM   losartan  (COZAAR ) 50 MG tablet TAKE ONE TABLET (50 MG) BY MOUTH TWICE DAILY @ 9AM & 5PM   pantoprazole  (PROTONIX ) 40 MG tablet TAKE ONE TABLET (40MG ) BY MOUTH DAILY AT 9AM   rosuvastatin  (CRESTOR ) 5 MG tablet TAKE ONE TABLET BY MOUTH DAILY AT 5PM   tirzepatide  (ZEPBOUND ) 2.5 MG/0.5ML Pen Inject 2.5 mg into the skin once a week.   No facility-administered encounter medications on file as of 11/28/2023.    Surgical History: Past Surgical History:  Procedure Laterality Date   BREAST EXCISIONAL BIOPSY Left    Neg years ago   CATARACT EXTRACTION W/PHACO Left 06/30/2022   Procedure: CATARACT EXTRACTION PHACO AND INTRAOCULAR LENS PLACEMENT (IOC) LEFT DIABETIC 10.40 01:05.1;   Surgeon: Mittie Gaskin, MD;  Location: [facility];  Service: Ophthalmology;  Laterality: Left;   CATARACT EXTRACTION W/PHACO Right 07/14/2022   Procedure: CATARACT EXTRACTION PHACO AND INTRAOCULAR LENS PLACEMENT (IOC) RIGHT DIABETIC 13.38 01:16.9;  Surgeon: Mittie Gaskin, MD;  Location: [facility];  Service: Ophthalmology;  Laterality: Right;   CHOLECYSTECTOMY     COLONOSCOPY WITH PROPOFOL  N/A 01/31/2015   Procedure: COLONOSCOPY WITH PROPOFOL ;  Surgeon: Gladis RAYMOND Mariner, MD;  Location: [facility] END[facility];  Service: Endoscopy;  Laterality: N/A;   TONSILLECTOMY      Medical History: Past Medical History:  Diagnosis Date   GERD (gastroesophageal reflux disease)    Hypertension    Obesity    Pre-diabetes    Protein in urine     Family History: Family History  Problem Relation Age of Onset   Breast cancer Paternal Aunt    Hypertension Mother    Heart disease Father     Social History   Socioeconomic History   Marital status: Married    Spouse name: Not on file   Number of children: Not on file   Years of education: Not on file   Highest education level: Not on file  Occupational History   Not on file  Tobacco Use   Smoking status: Never   Smokeless tobacco: Never  Substance and Sexual Activity   Alcohol use: No   Drug use: No   Sexual activity: Not on file  Other  Topics Concern   Not on file  Social History Narrative   Not on file   Social Drivers of Health   Financial Resource Strain: Low Risk  (11/28/2023)   Received from [facility]   Overall Financial Resource Strain (CARDIA)    Difficulty of Paying Living Expenses: Not hard at all  Recent Concern: Financial Resource Strain - Medium Risk (10/27/2023)   Received from [facility]   Overall Financial Resource Strain (CARDIA)    Difficulty of Paying Living Expenses: Somewhat hard  Food Insecurity: No Food Insecurity (11/28/2023)   Received from  [facility]   Hunger Vital Sign    Within the past 12 months, you worried that your food would run out before you got the money to buy more.: Never true    Within the past 12 months, the food you bought just didn't last and you didn't have money to get more.: Never true  Transportation Needs: No Transportation Needs (11/28/2023)   Received from [facility] - [facility]    In the past 12 months, has lack of transportation kept you from medical appointments or from getting medications?: No    Lack of Transportation (Non-Medical): No  Physical Activity: Not on file  Stress: Not on file  Social Connections: Not on file  Intimate Partner Violence: Not on file      Review of Systems  Constitutional:  Negative for chills, fatigue and unexpected weight change.  HENT:  Negative for congestion, rhinorrhea, sneezing and sore throat.   Eyes:  Negative for redness.  Respiratory:  Negative for cough, chest tightness and shortness of breath.   Cardiovascular:  Negative for chest pain and palpitations.  Gastrointestinal:  Negative for abdominal pain, constipation, diarrhea, nausea and vomiting.  Genitourinary:  Negative for dysuria and frequency.  Musculoskeletal:  Positive for arthralgias and myalgias. Negative for back pain, joint swelling and neck pain.  Skin:  Negative for rash.  Neurological: Negative.  Negative for tremors and numbness.  Hematological:  Negative for adenopathy. Does not bruise/bleed easily.  Psychiatric/Behavioral:  Positive for sleep disturbance. Negative for behavioral problems (Depression) and suicidal ideas. The patient is not nervous/anxious.     Vital Signs: BP 130/84   Pulse 78   Temp 97.8 F (36.6 C)   Resp 16   Ht 5' (1.524 m)   Wt 206 lb (93.4 kg)   SpO2 94%   BMI 40.23 kg/m    Physical Exam Vitals and nursing note reviewed.  Constitutional:      General: She is not in acute distress.    Appearance:  Normal appearance. She is well-developed. She is obese. She is not diaphoretic.  HENT:     Head: Normocephalic and atraumatic.  Neck:     Thyroid : No thyromegaly.     Vascular: No JVD.     Trachea: No tracheal deviation.  Cardiovascular:     Rate and Rhythm: Normal rate and regular rhythm.     Heart sounds: Normal heart sounds.  Pulmonary:     Effort: Pulmonary effort is normal.  Chest:  Breasts:    Right: Normal. No mass.     Left: Normal. No mass.  Musculoskeletal:     Cervical back: Normal range of motion and neck supple.  Lymphadenopathy:     Cervical: No cervical adenopathy.  Skin:    General: Skin is warm and dry.  Neurological:     Mental Status: She is alert  and oriented to person, place, and time.     Cranial Nerves: No cranial nerve deficit.  Psychiatric:        Behavior: Behavior normal.        Thought Content: Thought content normal.        Judgment: Judgment normal.        Assessment/Plan: 1. OSA (obstructive sleep apnea) (Primary) Increase nightly use of CPAP. Unknown if compliance requirement for her insurance, but advised on importance of treating her apnea by wearing nightly regardless.  2. CPAP use counseling CPAP couseling-Discussed importance of adequate CPAP use as well as proper care and cleaning techniques of machine and all supplies.  3. Essential hypertension Stable, continue current medications  4. Morbid obesity with BMI of 40.0-44.9, adult (HCC) Continue to work on diet and exercise   General Counseling: Taquita verbalizes understanding of the findings of todays visit and agrees with plan of treatment. I have discussed any further diagnostic evaluation that may be needed or ordered today. We also reviewed her medications today. she has been encouraged to call the office with any questions or concerns that should arise related to todays visit.    No orders of the defined types were placed in this encounter.   No orders of the defined  types were placed in this encounter.   This patient was seen by Tinnie Pro, PA-C in collaboration with Dr. Sigrid Bathe as a part of collaborative care agreement.   Total time spent:30 Minutes Time spent includes review of chart, medications, test results, and follow up plan with the patient.      Dr Fozia M Khan Internal medicine

## 2023-12-10 ENCOUNTER — Other Ambulatory Visit: Payer: Self-pay | Admitting: Physician Assistant

## 2023-12-15 ENCOUNTER — Other Ambulatory Visit: Payer: Self-pay

## 2024-01-03 ENCOUNTER — Other Ambulatory Visit: Payer: Self-pay | Admitting: Physician Assistant

## 2024-01-03 DIAGNOSIS — E782 Mixed hyperlipidemia: Secondary | ICD-10-CM

## 2024-01-03 DIAGNOSIS — I10 Essential (primary) hypertension: Secondary | ICD-10-CM

## 2024-01-09 ENCOUNTER — Ambulatory Visit: Admitting: Physician Assistant

## 2024-01-16 ENCOUNTER — Encounter: Payer: Self-pay | Admitting: Physician Assistant

## 2024-01-16 ENCOUNTER — Ambulatory Visit: Admitting: Physician Assistant

## 2024-01-16 VITALS — BP 130/84 | HR 71 | Temp 95.5°F | Resp 16 | Ht 60.0 in | Wt 207.8 lb

## 2024-01-16 DIAGNOSIS — Z1329 Encounter for screening for other suspected endocrine disorder: Secondary | ICD-10-CM

## 2024-01-16 DIAGNOSIS — R5383 Other fatigue: Secondary | ICD-10-CM

## 2024-01-16 DIAGNOSIS — Z7189 Other specified counseling: Secondary | ICD-10-CM

## 2024-01-16 DIAGNOSIS — R7303 Prediabetes: Secondary | ICD-10-CM

## 2024-01-16 DIAGNOSIS — E538 Deficiency of other specified B group vitamins: Secondary | ICD-10-CM | POA: Diagnosis not present

## 2024-01-16 DIAGNOSIS — I10 Essential (primary) hypertension: Secondary | ICD-10-CM

## 2024-01-16 DIAGNOSIS — E559 Vitamin D deficiency, unspecified: Secondary | ICD-10-CM | POA: Diagnosis not present

## 2024-01-16 DIAGNOSIS — G4733 Obstructive sleep apnea (adult) (pediatric): Secondary | ICD-10-CM | POA: Diagnosis not present

## 2024-01-16 MED ORDER — ZEPBOUND 2.5 MG/0.5ML ~~LOC~~ SOAJ: 2.5000 mg | SUBCUTANEOUS | 2 refills | Status: AC

## 2024-01-16 NOTE — Progress Notes (Unsigned)
 [facility] [address] [place], [place] [address]  Internal MEDICINE  Office Visit Note  Patient Name: Jamie Powers  [id number]  [id number]  Date of Service: 01/16/2024  Chief Complaint  Patient presents with   Gastroesophageal Reflux   Hypertension   Follow-up    Cpap download    HPI Pt is here for routine follow up -Using CPAP most nights now. Does sometimes feel more tired, wears nasal. A little nasal dryness and congestion.  -no gasping or snoring in mask -BP stable -will retry zepbound  for OSA  Current Medication: Outpatient Encounter Medications as of 01/16/2024  Medication Sig   amLODipine  (NORVASC ) 2.5 MG tablet TAKE ONE TABLET BY MOUTH DAILY AT 9AM   Blood Glucose Monitoring Suppl (ONETOUCH VERIO) w/Device KIT Use as directed. E11.9   CALCIUM -VITAMIN D  PO Take by mouth. (Patient not taking: Reported on 01/16/2024)   celecoxib (CELEBREX) 50 MG capsule Take 50 mg by mouth as needed for pain. (Patient not taking: Reported on 01/16/2024)   cetirizine  (ZYRTEC ) 10 MG tablet TAKE ONE TABLET BY MOUTH DAILY AT 9AM   empagliflozin  (JARDIANCE ) 25 MG TABS tablet Take 25 mg by mouth daily.   FLUoxetine  (PROZAC ) 10 MG capsule TAKE ONE CAPSULE BY MOUTH DAILY AT 9AM   losartan  (COZAAR ) 50 MG tablet TAKE ONE TABLET (50 MG) BY MOUTH TWICE DAILY @ 9AM & 5PM   pantoprazole  (PROTONIX ) 40 MG tablet TAKE ONE TABLET (40MG ) BY MOUTH DAILY AT 9AM   rosuvastatin  (CRESTOR ) 5 MG tablet TAKE ONE TABLET BY MOUTH DAILY AT 5PM   tirzepatide  (ZEPBOUND ) 2.5 MG/0.5ML Pen Inject 2.5 mg into the skin once a week. (Patient not taking: Reported on 01/16/2024)   No facility-administered encounter medications on file as of 01/16/2024.    Surgical History: Past Surgical History:  Procedure Laterality Date   BREAST EXCISIONAL BIOPSY Left    Neg years ago   CATARACT EXTRACTION W/PHACO Left 06/30/2022   Procedure: CATARACT EXTRACTION PHACO AND INTRAOCULAR LENS PLACEMENT (IOC) LEFT DIABETIC  10.40 01:05.1;  Surgeon: Mittie Gaskin, MD;  Location: [facility];  Service: Ophthalmology;  Laterality: Left;   CATARACT EXTRACTION W/PHACO Right 07/14/2022   Procedure: CATARACT EXTRACTION PHACO AND INTRAOCULAR LENS PLACEMENT (IOC) RIGHT DIABETIC 13.38 01:16.9;  Surgeon: Mittie Gaskin, MD;  Location: [facility];  Service: Ophthalmology;  Laterality: Right;   CHOLECYSTECTOMY     COLONOSCOPY WITH PROPOFOL  N/A 01/31/2015   Procedure: COLONOSCOPY WITH PROPOFOL ;  Surgeon: Gladis RAYMOND Mariner, MD;  Location: [facility];  Service: Endoscopy;  Laterality: N/A;   TONSILLECTOMY      Medical History: Past Medical History:  Diagnosis Date   GERD (gastroesophageal reflux disease)    Hypertension    Obesity    Pre-diabetes    Protein in urine     Family History: Family History  Problem Relation Age of Onset   Breast cancer Paternal Aunt    Hypertension Mother    Heart disease Father     Social History   Socioeconomic History   Marital status: Married    Spouse name: Not on file   Number of children: Not on file   Years of education: Not on file   Highest education level: Not on file  Occupational History   Not on file  Tobacco Use   Smoking status: Never   Smokeless tobacco: Never  Substance and Sexual Activity   Alcohol use: No   Drug use: No   Sexual activity: Not on file  Other Topics  Concern   Not on file  Social History Narrative   Not on file   Social Drivers of Health   Tobacco Use: Low Risk (01/16/2024)   Patient History    Smoking Tobacco Use: Never    Smokeless Tobacco Use: Never    Passive Exposure: Not on file  Financial Resource Strain: Low Risk  (11/28/2023)   Received [facility]   Overall Financial Resource Strain (CARDIA)    Difficulty of Paying Living Expenses: Not hard at all  Recent Concern: Financial Resource Strain - Medium Risk (10/27/2023)   Received from [facility]    Overall Financial Resource Strain (CARDIA)    Difficulty of Paying Living Expenses: Somewhat hard  Food Insecurity: No Food Insecurity (11/28/2023)   Received from [facility]   Epic    Within the past 12 months, you worried that your food would run out before you got the money to buy more.: Never true    Within the past 12 months, the food you bought just didn't last and you didn't have money to get more.: Never true  Transportation Needs: No Transportation Needs (11/28/2023)   Received from [facility]    In the past 12 months, has lack of transportation kept you from medical appointments or from getting medications?: No    Lack of Transportation (Non-Medical): No  Physical Activity: Not on file  Stress: Not on file  Social Connections: Not on file  Intimate Partner Violence: Not on file  Depression (PHQ2-9): Low Risk (11/03/2023)   Depression (PHQ2-9)    PHQ-2 Score: 0  Alcohol Screen: Not on file  Housing: Low Risk  (11/28/2023)   Received from [facility]   Epic    In the last 12 months, was there a time when you were not able to pay the mortgage or rent on time?: No    In the past 12 months, how many times have you moved where you were living?: 0    At any time in the past 12 months, were you homeless or living in a shelter (including now)?: No  Utilities: Not At Risk (11/28/2023)   Received from [facility]   Epic    In the past 12 months has the electric, gas, oil, or water company threatened to shut off services in your home?: No  Health Literacy: Not on file      Review of Systems  Vital Signs: BP 130/84   Pulse 71   Temp (!) 95.5 F (35.3 C)   Resp 16   Ht 5' (1.524 m)   Wt 207 lb 12.8 oz (94.3 kg)   SpO2 95%   BMI 40.58 kg/m    Physical Exam     Assessment/Plan:   General Counseling: Buffy verbalizes understanding of the findings of todays visit  and agrees with plan of treatment. I have discussed any further diagnostic evaluation that may be needed or ordered today. We also reviewed her medications today. she has been encouraged to call the office with any questions or concerns that should arise related to todays visit.    No orders of the defined types were placed in this encounter.   No orders of the defined types were placed in this encounter.   This patient was seen by Tinnie Pro, PA-C in collaboration with Dr. Sigrid Bathe as a part of collaborative care agreement.   Total time spent:***  Minutes Time spent includes review of chart, medications, test results, and follow up plan with the patient.      Dr Fozia M Khan Internal medicine

## 2024-07-16 ENCOUNTER — Ambulatory Visit: Admitting: Physician Assistant
